# Patient Record
Sex: Female | Born: 1940 | Race: White | Hispanic: No | Marital: Married | Smoking: Never smoker
Health system: Southern US, Community
[De-identification: ages and names within clinical notes are randomized; demographics above are authoritative.]

## PROBLEM LIST (undated history)

## (undated) DIAGNOSIS — G2581 Restless legs syndrome: Secondary | ICD-10-CM

## (undated) DIAGNOSIS — K219 Gastro-esophageal reflux disease without esophagitis: Secondary | ICD-10-CM

## (undated) DIAGNOSIS — E785 Hyperlipidemia, unspecified: Secondary | ICD-10-CM

## (undated) DIAGNOSIS — L509 Urticaria, unspecified: Secondary | ICD-10-CM

## (undated) DIAGNOSIS — E039 Hypothyroidism, unspecified: Secondary | ICD-10-CM

## (undated) DIAGNOSIS — I340 Nonrheumatic mitral (valve) insufficiency: Secondary | ICD-10-CM

## (undated) DIAGNOSIS — K3184 Gastroparesis: Secondary | ICD-10-CM

## (undated) DIAGNOSIS — R945 Abnormal results of liver function studies: Secondary | ICD-10-CM

## (undated) DIAGNOSIS — I10 Essential (primary) hypertension: Secondary | ICD-10-CM

## (undated) DIAGNOSIS — R39198 Other difficulties with micturition: Secondary | ICD-10-CM

## (undated) DIAGNOSIS — D369 Benign neoplasm, unspecified site: Secondary | ICD-10-CM

## (undated) DIAGNOSIS — K5730 Diverticulosis of large intestine without perforation or abscess without bleeding: Secondary | ICD-10-CM

## (undated) DIAGNOSIS — M4802 Spinal stenosis, cervical region: Secondary | ICD-10-CM

## (undated) DIAGNOSIS — F419 Anxiety disorder, unspecified: Secondary | ICD-10-CM

## (undated) DIAGNOSIS — M1990 Unspecified osteoarthritis, unspecified site: Secondary | ICD-10-CM

## (undated) DIAGNOSIS — K861 Other chronic pancreatitis: Secondary | ICD-10-CM

## (undated) DIAGNOSIS — K635 Polyp of colon: Secondary | ICD-10-CM

## (undated) DIAGNOSIS — D649 Anemia, unspecified: Secondary | ICD-10-CM

## (undated) DIAGNOSIS — R7611 Nonspecific reaction to tuberculin skin test without active tuberculosis: Secondary | ICD-10-CM

## (undated) DIAGNOSIS — M47816 Spondylosis without myelopathy or radiculopathy, lumbar region: Secondary | ICD-10-CM

## (undated) DIAGNOSIS — K760 Fatty (change of) liver, not elsewhere classified: Secondary | ICD-10-CM

## (undated) DIAGNOSIS — C50919 Malignant neoplasm of unspecified site of unspecified female breast: Secondary | ICD-10-CM

## (undated) DIAGNOSIS — R7989 Other specified abnormal findings of blood chemistry: Secondary | ICD-10-CM

## (undated) DIAGNOSIS — K5909 Other constipation: Secondary | ICD-10-CM

## (undated) DIAGNOSIS — G4700 Insomnia, unspecified: Secondary | ICD-10-CM

## (undated) DIAGNOSIS — G959 Disease of spinal cord, unspecified: Secondary | ICD-10-CM

## (undated) DIAGNOSIS — I351 Nonrheumatic aortic (valve) insufficiency: Secondary | ICD-10-CM

## (undated) HISTORY — PX: UTERINE FIBROID SURGERY: SHX826

## (undated) HISTORY — DX: Nonrheumatic mitral (valve) insufficiency: I34.0

## (undated) HISTORY — DX: Other specified abnormal findings of blood chemistry: R79.89

## (undated) HISTORY — DX: Hyperlipidemia, unspecified: E78.5

## (undated) HISTORY — DX: Unspecified osteoarthritis, unspecified site: M19.90

## (undated) HISTORY — DX: Other chronic pancreatitis: K86.1

## (undated) HISTORY — DX: Spondylosis without myelopathy or radiculopathy, lumbar region: M47.816

## (undated) HISTORY — DX: Anxiety disorder, unspecified: F41.9

## (undated) HISTORY — PX: APPENDECTOMY: SHX54

## (undated) HISTORY — DX: Disease of spinal cord, unspecified: G95.9

## (undated) HISTORY — DX: Nonspecific reaction to tuberculin skin test without active tuberculosis: R76.11

## (undated) HISTORY — PX: EYE SURGERY: SHX253

## (undated) HISTORY — DX: Malignant neoplasm of unspecified site of unspecified female breast: C50.919

## (undated) HISTORY — DX: Gastroparesis: K31.84

## (undated) HISTORY — DX: Spinal stenosis, cervical region: M48.02

## (undated) HISTORY — PX: OTHER SURGICAL HISTORY: SHX169

## (undated) HISTORY — DX: Essential (primary) hypertension: I10

## (undated) HISTORY — DX: Abnormal results of liver function studies: R94.5

## (undated) HISTORY — DX: Diverticulosis of large intestine without perforation or abscess without bleeding: K57.30

## (undated) HISTORY — DX: Insomnia, unspecified: G47.00

## (undated) HISTORY — DX: Hypothyroidism, unspecified: E03.9

## (undated) HISTORY — DX: Nonrheumatic aortic (valve) insufficiency: I35.1

## (undated) HISTORY — DX: Fatty (change of) liver, not elsewhere classified: K76.0

## (undated) HISTORY — DX: Polyp of colon: K63.5

## (undated) HISTORY — PX: GANGLION CYST EXCISION: SHX1691

## (undated) HISTORY — DX: Benign neoplasm, unspecified site: D36.9

## (undated) HISTORY — DX: Gastro-esophageal reflux disease without esophagitis: K21.9

---

## 1998-11-19 DIAGNOSIS — D369 Benign neoplasm, unspecified site: Secondary | ICD-10-CM

## 1998-11-19 HISTORY — DX: Benign neoplasm, unspecified site: D36.9

## 1999-05-22 HISTORY — PX: CARDIAC CATHETERIZATION: SHX172

## 2000-12-15 ENCOUNTER — Inpatient Hospital Stay (HOSPITAL_COMMUNITY): Admission: EM | Admit: 2000-12-15 | Discharge: 2000-12-17 | Payer: Self-pay | Admitting: *Deleted

## 2001-09-19 ENCOUNTER — Ambulatory Visit (HOSPITAL_COMMUNITY): Admission: RE | Admit: 2001-09-19 | Discharge: 2001-09-19 | Payer: Self-pay | Admitting: Gastroenterology

## 2001-09-19 ENCOUNTER — Encounter: Payer: Self-pay | Admitting: Gastroenterology

## 2001-11-18 ENCOUNTER — Encounter: Payer: Self-pay | Admitting: Internal Medicine

## 2001-11-18 ENCOUNTER — Encounter: Admission: RE | Admit: 2001-11-18 | Discharge: 2001-11-18 | Payer: Self-pay | Admitting: Internal Medicine

## 2003-02-17 ENCOUNTER — Encounter: Payer: Self-pay | Admitting: Internal Medicine

## 2003-02-17 ENCOUNTER — Encounter: Admission: RE | Admit: 2003-02-17 | Discharge: 2003-02-17 | Payer: Self-pay | Admitting: Internal Medicine

## 2003-02-20 ENCOUNTER — Emergency Department (HOSPITAL_COMMUNITY): Admission: EM | Admit: 2003-02-20 | Discharge: 2003-02-20 | Payer: Self-pay | Admitting: Emergency Medicine

## 2003-02-20 ENCOUNTER — Encounter: Payer: Self-pay | Admitting: Emergency Medicine

## 2003-03-09 ENCOUNTER — Ambulatory Visit (HOSPITAL_COMMUNITY): Admission: RE | Admit: 2003-03-09 | Discharge: 2003-03-09 | Payer: Self-pay | Admitting: Neurology

## 2004-08-30 ENCOUNTER — Ambulatory Visit: Payer: Self-pay | Admitting: Gastroenterology

## 2004-09-05 ENCOUNTER — Ambulatory Visit: Payer: Self-pay | Admitting: Gastroenterology

## 2004-09-14 ENCOUNTER — Ambulatory Visit: Payer: Self-pay | Admitting: Gastroenterology

## 2004-09-21 ENCOUNTER — Ambulatory Visit: Payer: Self-pay | Admitting: Internal Medicine

## 2004-09-25 ENCOUNTER — Encounter: Payer: Self-pay | Admitting: Internal Medicine

## 2004-09-25 ENCOUNTER — Ambulatory Visit: Payer: Self-pay | Admitting: Gastroenterology

## 2004-09-25 LAB — HM COLONOSCOPY

## 2004-12-18 ENCOUNTER — Ambulatory Visit: Payer: Self-pay | Admitting: Internal Medicine

## 2005-02-23 ENCOUNTER — Ambulatory Visit: Payer: Self-pay | Admitting: Internal Medicine

## 2005-02-28 ENCOUNTER — Ambulatory Visit: Payer: Self-pay | Admitting: Internal Medicine

## 2005-09-20 ENCOUNTER — Ambulatory Visit: Payer: Self-pay | Admitting: Gastroenterology

## 2005-09-20 LAB — PROTIME-INR

## 2005-09-24 ENCOUNTER — Ambulatory Visit: Payer: Self-pay | Admitting: Gastroenterology

## 2005-10-01 ENCOUNTER — Ambulatory Visit: Payer: Self-pay | Admitting: Internal Medicine

## 2005-11-22 ENCOUNTER — Ambulatory Visit: Payer: Self-pay | Admitting: Internal Medicine

## 2006-02-26 ENCOUNTER — Ambulatory Visit: Payer: Self-pay | Admitting: Internal Medicine

## 2006-03-18 ENCOUNTER — Ambulatory Visit: Payer: Self-pay | Admitting: Internal Medicine

## 2006-03-22 ENCOUNTER — Ambulatory Visit: Payer: Self-pay | Admitting: Internal Medicine

## 2006-03-22 LAB — CONVERTED CEMR LAB
Calcium: 9.3 mg/dL
Sed Rate: 37 mm/h — ABNORMAL HIGH
TSH: 1.73 u[IU]/mL
Total CK: 85 U/L

## 2006-04-17 ENCOUNTER — Ambulatory Visit: Payer: Self-pay | Admitting: Internal Medicine

## 2006-07-26 ENCOUNTER — Ambulatory Visit: Payer: Self-pay | Admitting: Internal Medicine

## 2006-09-04 ENCOUNTER — Ambulatory Visit: Payer: Self-pay | Admitting: Internal Medicine

## 2006-09-04 LAB — CONVERTED CEMR LAB
ALT: 94 units/L — ABNORMAL HIGH (ref 0–40)
AST: 72 units/L — ABNORMAL HIGH (ref 0–37)
Alkaline Phosphatase: 51 units/L (ref 39–117)
Bilirubin, Direct: 0.1 mg/dL (ref 0.0–0.3)
Total Bilirubin: 0.6 mg/dL (ref 0.3–1.2)
Total Protein: 7.6 g/dL (ref 6.0–8.3)

## 2006-10-30 ENCOUNTER — Telehealth: Payer: Self-pay | Admitting: Internal Medicine

## 2006-11-11 ENCOUNTER — Ambulatory Visit: Payer: Self-pay | Admitting: Internal Medicine

## 2006-11-11 DIAGNOSIS — E039 Hypothyroidism, unspecified: Secondary | ICD-10-CM

## 2006-11-11 DIAGNOSIS — G43909 Migraine, unspecified, not intractable, without status migrainosus: Secondary | ICD-10-CM | POA: Insufficient documentation

## 2006-11-11 DIAGNOSIS — I10 Essential (primary) hypertension: Secondary | ICD-10-CM | POA: Insufficient documentation

## 2006-11-11 DIAGNOSIS — E119 Type 2 diabetes mellitus without complications: Secondary | ICD-10-CM

## 2006-11-11 DIAGNOSIS — M1990 Unspecified osteoarthritis, unspecified site: Secondary | ICD-10-CM

## 2006-11-11 DIAGNOSIS — G2581 Restless legs syndrome: Secondary | ICD-10-CM

## 2006-11-11 DIAGNOSIS — R5382 Chronic fatigue, unspecified: Secondary | ICD-10-CM

## 2006-11-11 DIAGNOSIS — K5730 Diverticulosis of large intestine without perforation or abscess without bleeding: Secondary | ICD-10-CM | POA: Insufficient documentation

## 2006-11-11 DIAGNOSIS — K760 Fatty (change of) liver, not elsewhere classified: Secondary | ICD-10-CM

## 2006-11-13 LAB — CONVERTED CEMR LAB
Basophils Absolute: 0.1 10*3/uL (ref 0.0–0.1)
Basophils Relative: 1 % (ref 0.0–1.0)
Creatinine,U: 117.9 mg/dL
Hgb A1c MFr Bld: 7.7 % — ABNORMAL HIGH (ref 4.6–6.0)
MCHC: 33.3 g/dL (ref 30.0–36.0)
Microalb Creat Ratio: 6.8 mg/g (ref 0.0–30.0)
Microalb, Ur: 0.8 mg/dL (ref 0.0–1.9)
Monocytes Absolute: 0.5 10*3/uL (ref 0.2–0.7)
Monocytes Relative: 7.5 % (ref 3.0–11.0)
Platelets: 217 10*3/uL (ref 150–400)
RBC: 4.67 M/uL (ref 3.87–5.11)
RDW: 12.7 % (ref 11.5–14.6)

## 2006-11-18 ENCOUNTER — Telehealth: Payer: Self-pay | Admitting: Internal Medicine

## 2006-11-19 ENCOUNTER — Telehealth (INDEPENDENT_AMBULATORY_CARE_PROVIDER_SITE_OTHER): Payer: Self-pay | Admitting: *Deleted

## 2007-01-13 ENCOUNTER — Encounter: Payer: Self-pay | Admitting: Internal Medicine

## 2007-03-28 ENCOUNTER — Encounter: Payer: Self-pay | Admitting: Internal Medicine

## 2007-04-14 ENCOUNTER — Encounter: Payer: Self-pay | Admitting: Internal Medicine

## 2007-04-21 DIAGNOSIS — C50919 Malignant neoplasm of unspecified site of unspecified female breast: Secondary | ICD-10-CM

## 2007-04-21 HISTORY — DX: Malignant neoplasm of unspecified site of unspecified female breast: C50.919

## 2007-04-24 ENCOUNTER — Encounter: Payer: Self-pay | Admitting: Internal Medicine

## 2007-04-25 ENCOUNTER — Encounter: Payer: Self-pay | Admitting: Internal Medicine

## 2007-05-07 ENCOUNTER — Encounter: Payer: Self-pay | Admitting: Internal Medicine

## 2007-05-08 ENCOUNTER — Encounter: Payer: Self-pay | Admitting: Internal Medicine

## 2007-05-22 DIAGNOSIS — Z923 Personal history of irradiation: Secondary | ICD-10-CM

## 2007-05-22 HISTORY — DX: Personal history of irradiation: Z92.3

## 2007-05-22 HISTORY — PX: BREAST LUMPECTOMY: SHX2

## 2007-08-27 ENCOUNTER — Encounter: Payer: Self-pay | Admitting: Internal Medicine

## 2007-10-10 ENCOUNTER — Encounter: Payer: Self-pay | Admitting: Internal Medicine

## 2007-10-20 ENCOUNTER — Encounter
Admission: RE | Admit: 2007-10-20 | Discharge: 2007-10-20 | Payer: Self-pay | Admitting: Physical Medicine and Rehabilitation

## 2007-10-24 ENCOUNTER — Encounter: Payer: Self-pay | Admitting: Internal Medicine

## 2007-10-29 ENCOUNTER — Encounter: Payer: Self-pay | Admitting: Internal Medicine

## 2007-11-04 ENCOUNTER — Telehealth (INDEPENDENT_AMBULATORY_CARE_PROVIDER_SITE_OTHER): Payer: Self-pay | Admitting: *Deleted

## 2007-11-04 ENCOUNTER — Emergency Department (HOSPITAL_COMMUNITY): Admission: EM | Admit: 2007-11-04 | Discharge: 2007-11-04 | Payer: Self-pay | Admitting: Emergency Medicine

## 2007-11-05 ENCOUNTER — Ambulatory Visit: Payer: Self-pay | Admitting: Internal Medicine

## 2007-11-05 DIAGNOSIS — C50919 Malignant neoplasm of unspecified site of unspecified female breast: Secondary | ICD-10-CM | POA: Insufficient documentation

## 2007-11-05 DIAGNOSIS — R079 Chest pain, unspecified: Secondary | ICD-10-CM | POA: Insufficient documentation

## 2007-11-06 ENCOUNTER — Telehealth (INDEPENDENT_AMBULATORY_CARE_PROVIDER_SITE_OTHER): Payer: Self-pay | Admitting: *Deleted

## 2007-11-12 ENCOUNTER — Encounter: Payer: Self-pay | Admitting: Family Medicine

## 2007-11-20 ENCOUNTER — Ambulatory Visit: Payer: Self-pay | Admitting: Internal Medicine

## 2007-11-20 DIAGNOSIS — F411 Generalized anxiety disorder: Secondary | ICD-10-CM

## 2007-11-20 DIAGNOSIS — G250 Essential tremor: Secondary | ICD-10-CM

## 2007-11-20 DIAGNOSIS — G252 Other specified forms of tremor: Secondary | ICD-10-CM

## 2007-11-28 ENCOUNTER — Telehealth: Payer: Self-pay | Admitting: Internal Medicine

## 2007-11-28 LAB — CONVERTED CEMR LAB
ALT: 73 units/L — ABNORMAL HIGH (ref 0–35)
Alkaline Phosphatase: 41 units/L (ref 39–117)
BUN: 18 mg/dL (ref 6–23)
Bilirubin, Direct: 0.1 mg/dL (ref 0.0–0.3)
CO2: 30 meq/L (ref 19–32)
Calcium: 10.1 mg/dL (ref 8.4–10.5)
Eosinophils Relative: 1.3 % (ref 0.0–5.0)
Glucose, Bld: 95 mg/dL (ref 70–99)
Hemoglobin: 15.4 g/dL — ABNORMAL HIGH (ref 12.0–15.0)
LDL Cholesterol: 129 mg/dL — ABNORMAL HIGH (ref 0–99)
Lymphocytes Relative: 28.6 % (ref 12.0–46.0)
Monocytes Relative: 8.6 % (ref 3.0–12.0)
Neutro Abs: 4.8 10*3/uL (ref 1.4–7.7)
Potassium: 3.8 meq/L (ref 3.5–5.1)
RDW: 14 % (ref 11.5–14.6)
Sodium: 140 meq/L (ref 135–145)
Total CHOL/HDL Ratio: 3.6
Total Protein: 7.7 g/dL (ref 6.0–8.3)
Triglycerides: 76 mg/dL (ref 0–149)
WBC: 8 10*3/uL (ref 4.5–10.5)

## 2008-03-12 ENCOUNTER — Ambulatory Visit: Payer: Self-pay | Admitting: Internal Medicine

## 2008-03-12 DIAGNOSIS — E785 Hyperlipidemia, unspecified: Secondary | ICD-10-CM | POA: Insufficient documentation

## 2008-03-29 ENCOUNTER — Encounter: Payer: Self-pay | Admitting: Internal Medicine

## 2008-04-07 ENCOUNTER — Encounter: Payer: Self-pay | Admitting: Internal Medicine

## 2008-08-12 ENCOUNTER — Encounter: Payer: Self-pay | Admitting: Internal Medicine

## 2008-08-16 ENCOUNTER — Encounter: Payer: Self-pay | Admitting: Internal Medicine

## 2008-08-16 LAB — CONVERTED CEMR LAB
Creatinine, Ser: 0.8 mg/dL
Glucose, Bld: 182 mg/dL
Hemoglobin: 13.4 g/dL
Platelets: 166 10*3/uL

## 2008-08-19 ENCOUNTER — Encounter: Payer: Self-pay | Admitting: Internal Medicine

## 2008-09-03 ENCOUNTER — Encounter: Payer: Self-pay | Admitting: Internal Medicine

## 2008-10-06 ENCOUNTER — Ambulatory Visit: Payer: Self-pay | Admitting: Internal Medicine

## 2008-10-08 ENCOUNTER — Ambulatory Visit: Payer: Self-pay | Admitting: Internal Medicine

## 2008-10-12 ENCOUNTER — Telehealth: Payer: Self-pay | Admitting: Internal Medicine

## 2008-10-14 ENCOUNTER — Telehealth (INDEPENDENT_AMBULATORY_CARE_PROVIDER_SITE_OTHER): Payer: Self-pay | Admitting: *Deleted

## 2008-10-14 LAB — CONVERTED CEMR LAB
ALT: 86 units/L — ABNORMAL HIGH (ref 0–35)
HDL: 40 mg/dL (ref >39.00)
Hgb A1c MFr Bld: 7.3 % — ABNORMAL HIGH (ref 4.6–6.5)
LDL Cholesterol: 91 mg/dL (ref 0–99)
TSH: 0.34 microintl units/mL — ABNORMAL LOW (ref 0.35–5.50)
Total Bilirubin: 0.6 mg/dL (ref 0.3–1.2)
Total CHOL/HDL Ratio: 4
VLDL: 24 mg/dL (ref 0.0–40.0)

## 2008-11-03 ENCOUNTER — Encounter: Admission: RE | Admit: 2008-11-03 | Discharge: 2008-11-03 | Payer: Self-pay | Admitting: Internal Medicine

## 2008-11-03 ENCOUNTER — Telehealth (INDEPENDENT_AMBULATORY_CARE_PROVIDER_SITE_OTHER): Payer: Self-pay | Admitting: *Deleted

## 2008-11-26 ENCOUNTER — Encounter: Payer: Self-pay | Admitting: Internal Medicine

## 2008-12-14 ENCOUNTER — Telehealth (INDEPENDENT_AMBULATORY_CARE_PROVIDER_SITE_OTHER): Payer: Self-pay | Admitting: *Deleted

## 2008-12-23 ENCOUNTER — Ambulatory Visit: Payer: Self-pay | Admitting: Internal Medicine

## 2008-12-28 ENCOUNTER — Telehealth: Payer: Self-pay | Admitting: Internal Medicine

## 2008-12-29 ENCOUNTER — Encounter: Admission: RE | Admit: 2008-12-29 | Discharge: 2008-12-29 | Payer: Self-pay | Admitting: Internal Medicine

## 2008-12-29 ENCOUNTER — Encounter: Payer: Self-pay | Admitting: Internal Medicine

## 2008-12-31 ENCOUNTER — Encounter: Payer: Self-pay | Admitting: Internal Medicine

## 2009-01-03 ENCOUNTER — Telehealth (INDEPENDENT_AMBULATORY_CARE_PROVIDER_SITE_OTHER): Payer: Self-pay | Admitting: *Deleted

## 2009-01-10 ENCOUNTER — Telehealth (INDEPENDENT_AMBULATORY_CARE_PROVIDER_SITE_OTHER): Payer: Self-pay | Admitting: *Deleted

## 2009-01-12 ENCOUNTER — Telehealth: Payer: Self-pay | Admitting: Internal Medicine

## 2009-03-22 ENCOUNTER — Ambulatory Visit: Payer: Self-pay | Admitting: Internal Medicine

## 2009-03-22 DIAGNOSIS — M810 Age-related osteoporosis without current pathological fracture: Secondary | ICD-10-CM

## 2009-06-21 HISTORY — PX: CATARACT EXTRACTION, BILATERAL: SHX1313

## 2009-06-29 ENCOUNTER — Encounter: Payer: Self-pay | Admitting: Internal Medicine

## 2009-07-04 ENCOUNTER — Encounter: Payer: Self-pay | Admitting: Internal Medicine

## 2009-07-06 ENCOUNTER — Encounter: Payer: Self-pay | Admitting: Internal Medicine

## 2009-08-10 ENCOUNTER — Encounter: Payer: Self-pay | Admitting: Internal Medicine

## 2009-08-30 ENCOUNTER — Ambulatory Visit: Payer: Self-pay | Admitting: Internal Medicine

## 2009-08-30 DIAGNOSIS — L509 Urticaria, unspecified: Secondary | ICD-10-CM

## 2009-09-05 ENCOUNTER — Ambulatory Visit: Payer: Self-pay | Admitting: Internal Medicine

## 2009-09-13 ENCOUNTER — Ambulatory Visit: Payer: Self-pay | Admitting: Cardiovascular Disease

## 2009-09-21 ENCOUNTER — Encounter: Payer: Self-pay | Admitting: Internal Medicine

## 2009-09-22 ENCOUNTER — Ambulatory Visit: Payer: Self-pay | Admitting: Internal Medicine

## 2009-09-22 LAB — CONVERTED CEMR LAB
Blood in Urine, dipstick: NEGATIVE
Creatinine,U: 83.3 mg/dL
Glucose, Urine, Semiquant: 250
Hgb A1c MFr Bld: 7.9 % — ABNORMAL HIGH (ref 4.6–6.5)
Ketones, urine, test strip: NEGATIVE
TSH: 0.37 microintl units/mL (ref 0.35–5.50)
pH: 5

## 2009-09-23 ENCOUNTER — Encounter: Payer: Self-pay | Admitting: Internal Medicine

## 2009-09-23 LAB — CONVERTED CEMR LAB
Bacteria, UA: NONE SEEN
Casts: NONE SEEN /lpf
WBC, UA: NONE SEEN cells/hpf (ref <3)

## 2009-09-25 ENCOUNTER — Telehealth (INDEPENDENT_AMBULATORY_CARE_PROVIDER_SITE_OTHER): Payer: Self-pay | Admitting: *Deleted

## 2009-09-27 ENCOUNTER — Ambulatory Visit: Payer: Self-pay | Admitting: Internal Medicine

## 2009-09-28 ENCOUNTER — Telehealth (INDEPENDENT_AMBULATORY_CARE_PROVIDER_SITE_OTHER): Payer: Self-pay | Admitting: *Deleted

## 2009-09-28 ENCOUNTER — Telehealth: Payer: Self-pay | Admitting: Internal Medicine

## 2009-09-29 ENCOUNTER — Encounter: Payer: Self-pay | Admitting: Cardiovascular Disease

## 2009-09-29 ENCOUNTER — Encounter (HOSPITAL_COMMUNITY): Admission: RE | Admit: 2009-09-29 | Discharge: 2009-11-22 | Payer: Self-pay | Admitting: Cardiovascular Disease

## 2009-09-29 ENCOUNTER — Ambulatory Visit: Payer: Self-pay

## 2009-09-29 ENCOUNTER — Ambulatory Visit: Payer: Self-pay | Admitting: Internal Medicine

## 2009-09-29 ENCOUNTER — Ambulatory Visit (HOSPITAL_COMMUNITY): Admission: RE | Admit: 2009-09-29 | Discharge: 2009-09-29 | Payer: Self-pay | Admitting: Cardiovascular Disease

## 2009-10-03 ENCOUNTER — Telehealth: Payer: Self-pay | Admitting: Internal Medicine

## 2009-10-06 ENCOUNTER — Ambulatory Visit: Payer: Self-pay | Admitting: Internal Medicine

## 2009-10-10 ENCOUNTER — Telehealth (INDEPENDENT_AMBULATORY_CARE_PROVIDER_SITE_OTHER): Payer: Self-pay | Admitting: *Deleted

## 2009-10-11 ENCOUNTER — Ambulatory Visit: Payer: Self-pay | Admitting: Cardiovascular Disease

## 2009-10-12 ENCOUNTER — Telehealth (INDEPENDENT_AMBULATORY_CARE_PROVIDER_SITE_OTHER): Payer: Self-pay | Admitting: *Deleted

## 2009-10-12 ENCOUNTER — Telehealth: Payer: Self-pay | Admitting: Cardiovascular Disease

## 2009-10-20 LAB — CONVERTED CEMR LAB: IgE (Immunoglobulin E), Serum: 228.5 intl units/mL — ABNORMAL HIGH (ref 0.0–180.0)

## 2009-10-28 ENCOUNTER — Encounter (INDEPENDENT_AMBULATORY_CARE_PROVIDER_SITE_OTHER): Payer: Self-pay | Admitting: *Deleted

## 2009-11-01 ENCOUNTER — Ambulatory Visit: Payer: Self-pay | Admitting: Internal Medicine

## 2009-11-07 ENCOUNTER — Ambulatory Visit: Payer: Self-pay | Admitting: Internal Medicine

## 2009-11-29 ENCOUNTER — Telehealth: Payer: Self-pay | Admitting: Internal Medicine

## 2009-12-19 ENCOUNTER — Ambulatory Visit: Payer: Self-pay | Admitting: Internal Medicine

## 2009-12-19 LAB — CONVERTED CEMR LAB: Blood Glucose, Fingerstick: 133

## 2010-01-19 ENCOUNTER — Encounter (INDEPENDENT_AMBULATORY_CARE_PROVIDER_SITE_OTHER): Payer: Self-pay | Admitting: *Deleted

## 2010-01-19 ENCOUNTER — Ambulatory Visit: Payer: Self-pay | Admitting: Internal Medicine

## 2010-01-20 ENCOUNTER — Telehealth: Payer: Self-pay | Admitting: Internal Medicine

## 2010-01-24 ENCOUNTER — Encounter: Payer: Self-pay | Admitting: Internal Medicine

## 2010-01-25 ENCOUNTER — Telehealth: Payer: Self-pay | Admitting: Internal Medicine

## 2010-02-18 DIAGNOSIS — K635 Polyp of colon: Secondary | ICD-10-CM

## 2010-02-18 HISTORY — DX: Polyp of colon: K63.5

## 2010-02-23 ENCOUNTER — Encounter: Payer: Self-pay | Admitting: Internal Medicine

## 2010-02-27 ENCOUNTER — Encounter (INDEPENDENT_AMBULATORY_CARE_PROVIDER_SITE_OTHER): Payer: Self-pay | Admitting: *Deleted

## 2010-02-27 ENCOUNTER — Telehealth: Payer: Self-pay | Admitting: Internal Medicine

## 2010-03-01 ENCOUNTER — Ambulatory Visit: Payer: Self-pay | Admitting: Gastroenterology

## 2010-03-08 ENCOUNTER — Telehealth (INDEPENDENT_AMBULATORY_CARE_PROVIDER_SITE_OTHER): Payer: Self-pay | Admitting: *Deleted

## 2010-03-09 ENCOUNTER — Encounter: Payer: Self-pay | Admitting: Internal Medicine

## 2010-03-09 ENCOUNTER — Telehealth: Payer: Self-pay | Admitting: Gastroenterology

## 2010-03-15 ENCOUNTER — Ambulatory Visit: Payer: Self-pay | Admitting: Gastroenterology

## 2010-03-19 ENCOUNTER — Encounter: Payer: Self-pay | Admitting: Gastroenterology

## 2010-03-21 ENCOUNTER — Encounter: Payer: Self-pay | Admitting: Internal Medicine

## 2010-04-05 ENCOUNTER — Telehealth: Payer: Self-pay | Admitting: Internal Medicine

## 2010-06-11 ENCOUNTER — Encounter: Payer: Self-pay | Admitting: Internal Medicine

## 2010-06-11 ENCOUNTER — Encounter: Payer: Self-pay | Admitting: Gastroenterology

## 2010-06-18 LAB — CONVERTED CEMR LAB
BUN: 13 mg/dL
BUN: 18 mg/dL (ref 6–23)
Basophils Relative: 0.7 % (ref 0.0–3.0)
Bilirubin, Direct: 0 mg/dL (ref 0.0–0.3)
CO2, serum: 28 mmol/L
CO2: 30 meq/L (ref 19–32)
Calcium: 9.8 mg/dL
Calcium: 9.8 mg/dL (ref 8.4–10.5)
Chloride, Serum: 99 mmol/L
Chloride: 103 meq/L (ref 96–112)
Creatinine, Ser: 0.7 mg/dL (ref 0.4–1.2)
Eosinophils Relative: 2.2 % (ref 0.0–5.0)
Glucose, Bld: 253 mg/dL
HCT: 42.6 % (ref 36.0–46.0)
HDL: 53 mg/dL
Hemoglobin: 14.5 g/dL (ref 12.0–15.0)
Hgb A1c MFr Bld: 7 % — ABNORMAL HIGH (ref 4.6–6.5)
Hgb A1c MFr Bld: 8.3 %
Lipase: 26 units/L
Lymphs Abs: 2.2 10*3/uL (ref 0.7–4.0)
MCV: 94.7 fL (ref 78.0–100.0)
Monocytes Absolute: 0.5 10*3/uL (ref 0.1–1.0)
Neutro Abs: 4.3 10*3/uL (ref 1.4–7.7)
Platelets: 187 10*3/uL (ref 150.0–400.0)
Potassium: 4.8 meq/L (ref 3.5–5.1)
RBC count: 4.39 10*6/uL
RBC: 4.49 M/uL (ref 3.87–5.11)
TSH: 0.52 microintl units/mL (ref 0.35–5.50)
Total Bilirubin: 0.9 mg/dL
Total Protein: 7.6 g/dL (ref 6.0–8.3)
Total Protein: 7.7 g/dL
Triglyceride fasting, serum: 95 mg/dL
WBC, blood: 7.1 10*3/uL
WBC: 7.2 10*3/uL (ref 4.5–10.5)

## 2010-06-20 NOTE — Miscellaneous (Signed)
Summary: [facility]  Clinical Lists Changes  Medications: Added new medication of MOVIPREP 100 GM  SOLR (PEG-KCL-NACL-NASULF-NA ASC-C) As per prep instructions. - Signed Rx of MOVIPREP 100 GM  SOLR (PEG-KCL-NACL-NASULF-NA ASC-C) As per prep instructions.;  #1 x 0;  Signed;  Entered by: Durwin Glaze RN;  Authorized by: Meryl Dare MD [facility];  Method used: Electronically to [id number]*, [address], [place], [place]  [address], Ph: [phone], Fax: [phone] Observations: Added new observation of ALLERGY REV: Done (03/01/2010 10:18)    Prescriptions: MOVIPREP 100 GM  SOLR (PEG-KCL-NACL-NASULF-NA ASC-C) As per prep instructions.  #1 x 0   Entered by:   Durwin Glaze RN   Authorized by:   Meryl Dare MD [facility]   Signed by:   Durwin Glaze RN on 03/01/2010   Method used:   Electronically to        [facility] [phone]* (retail)       [address] [place], [place]  [address]       Ph: [phone]       Fax: [phone]   RxID:   [id number]

## 2010-06-20 NOTE — Consult Note (Signed)
Summary: chronic pain syndrome, anxiety---[facility]  [facility] [place]   Imported By: Lanelle Bal 03/07/2010 15:26:53  _____________________________________________________________________  External Attachment:    Type:   Image     Comment:   External Document

## 2010-06-20 NOTE — Assessment & Plan Note (Signed)
Summary: rto 3 weeks.cbs   Vital Signs:  Patient profile:   70 year old female Height:      64.5 inches Weight:      170.8 pounds BMI:     28.97 Pulse rate:   82 / minute BP sitting:   140 / 80  Vitals Entered By: [name] (Sep 22, 2009 10:36 AM) CC: rov, c/o of dysuria & still having hives   History of Present Illness: rov c/o of dysuria , on-off x a while , see ROS  still having hives-- to see allergist in few days   CP--she was seen by cardiology, and they are planning a stress test  all labs from 2/11 reviewed  Diabetes-- las from 2-11 showed a A1C 8.3 . no ambulatory CBGs   HTN-- BMP 2-11 ok   hypothyroidism -- good medication compliance   elevated LFTS , Fatty liver per CT 2009-- LFTs 2-11  reviewed     Current Medications (verified): 1)  Metformin Hcl 500 Mg  Tabs (Metformin Hcl) .... 2 By Mouth Two Times A Day 2)  Glimepiride 4 Mg  Tabs (Glimepiride) .Marland Kitchen.. 1 By Mouth Two Times A Day 3)  Ultram   Tabs (Tramadol Hcl Tabs) 4)  Maxzide-25 37.5-25 Mg  Tabs (Triamterene-Hctz) .Marland Kitchen.. 1 By Mouth Once Daily 5)  Synthroid 125 Mcg Tabs (Levothyroxine Sodium) .Marland Kitchen.. 1 By Mouth Once Daily 6)  Ambien 10 Mg  Tabs (Zolpidem Tartrate) .Marland Kitchen.. 1 By Mouth At Bedtime 7)  Ascensia Contour Test  Strp (Glucose Blood) 8)  Arimidex 1 Mg  Tabs (Anastrozole) 9)  Alprazolam Xr 0.5 Mg  Tb24 (Alprazolam) .... 1/2 To 1 By Mouth Qid As Needed Anxiety or Tremor As Needed 10)  Mobic 7.5 Mg Tabs (Meloxicam) .Marland Kitchen.. 1 By Mouth Once Daily Prn 11)  Sinemet Cr 25-100 Mg Cr-Tabs (Carbidopa-Levodopa) .Marland Kitchen.. 1 By Mouth At Bedtime As Needed Leg Cramps 12)  Flonase 50 Mcg/act Susp (Fluticasone Propionate) .... 2 Sprays On Each Side of The Nose Prn 13)  Prilosec 20 Mg Cpdr (Omeprazole) .Marland Kitchen.. 1 By Mouth Daily 14)  Limbril .Marland Kitchen.. 1 Po As Needed  Allergies (verified): 1)  ! Iodine 2)  ! Pepcid 3)  ! * Serzone 4)  ! Nsaids  Past History:  Past Medical History:  Diabetes mellitus, type  II HTN Anxiety Hyperlipidemia hypothyroidism  elevated LFTS , Fatty liver per CT 2009 Diverticulosis, colon OA cervical spine stenosis-- sees neuro surgery and Dr Ethelene Hal, s/p local injection 4-09 Breast cancer, hx of (DX 12- 2008) - S/P XRT (last 4-09)  Past Surgical History: Reviewed history from 09/07/2009 and no changes required. Lumpectomy (2009) - left breast Appendectomy bilateral cataract surgery 2/11  Social History: Reviewed history from 12/23/2008 and no changes required. Married lives in [place] half of the time 2 kids Never Smoked Alcohol use-no Regular exercise-no   Review of Systems General:  Denies fever. CV:  Denies swelling of feet; occasionally plapitations w/  hot flashes . GI:  Denies bloody stools, nausea, and vomiting. GU:  Denies hematuria and urinary hesitancy; some frecuency . Endo:  diet-- "trying to do good" exercise-- can't do  due to problems w/ neck pain.  Physical Exam  General:  alert and well-developed.   Lungs:  normal respiratory effort, no intercostal retractions, no accessory muscle use, and normal breath sounds.   Heart:  normal rate, regular rhythm, no murmur, and no gallop.   Extremities:  no edema Psych:  Oriented X3, memory intact for recent and  remote, normally interactive, good eye contact,  slightly anxious   Impression & Recommendations:  Problem # 1:  DYSURIA (ICD-788.1) chronic, on and off symptoms.  Will treat based on the urine culture. recommend to discuss w/ Dr Sharen Hones (female care provider) , dryness? Orders: UA Dipstick w/o Micro (manual) (40981) Specimen Handling (99000) T-Culture, Urine ([id number]) T-Urine Microscopic (21308-65784)  Problem # 2:  URTICARIA (ICD-708.9) to see an allergist soon  Problem # 3:  HYPERLIPIDEMIA (ICD-272.4) well-controlled  Lipid Panel Test Date: 07/04/2009                        Value        Units        H/L   Reference  Cholesterol:          149          mg/dL               (696-295) LDL Cholesterol:      19           mg/dL         L    (28-413) HDL Cholesterol:      53           mg/dL              (24-40) Triglyceride:         95           mg/dL              (10-272)                  Problem # 4:  HYPOTHYROIDISM (ICD-244.9) due for labs Her updated medication list for this problem includes:    Synthroid 125 Mcg Tabs (Levothyroxine sodium) .Marland Kitchen... 1 by mouth once daily  Labs Reviewed: TSH: 0.52 (12/23/2008)    HgBA1c: 7.0 (12/23/2008) Chol: 155 (10/08/2008)   HDL: 40.00 (10/08/2008)   LDL: 91 (10/08/2008)   TG: 120.0 (10/08/2008)  Orders: Venipuncture ([address]) TLB-TSH (Thyroid Stimulating Hormone) (84443-TSH)  Problem # 5:  HYPERTENSION (ICD-401.9) no  change for now, BMP 2/11 normal Her updated medication list for this problem includes:    Maxzide-25 37.5-25 Mg Tabs (Triamterene-hctz) .Marland Kitchen... 1 by mouth once daily  BP today: 140/80 Prior BP: 143/80 (09/13/2009)  Labs Reviewed: K+: 4.8 (08/30/2009) Creat: : 0.8 (08/30/2009)   Chol: 155 (10/08/2008)   HDL: 40.00 (10/08/2008)   LDL: 91 (10/08/2008)   TG: 120.0 (10/08/2008)  Problem # 6:  DIABETES MELLITUS, TYPE II (ICD-250.00) A1c 2/11 at 8.3. No change on her medication was recommended  with those results patient feels that she is doing a good  job with her diet, unable to exercise more due to neck pain declines nutritionist referral she has a long history of increased LFTs, she reports extensive previous workup.  Because of  the elevated LFTs I am hesitant to continue adding  oral hypoglycemiants . I discussed with her insulin, she likes to check a A1C before further advise   Her updated medication list for this problem includes:    Metformin Hcl 500 Mg Tabs (Metformin hcl) .Marland Kitchen... 2 by mouth two times a day    Glimepiride 4 Mg Tabs (Glimepiride) .Marland Kitchen... 1 by mouth two times a day  Labs Reviewed: Creat: 0.8 (08/30/2009)     Last Eye Exam: normal (05/21/2008) Reviewed HgBA1c results: 7.0  (12/23/2008)  7.3 (10/08/2008)  Orders: TLB-A1C / Hgb A1C (Glycohemoglobin) (83036-A1C) TLB-Microalbumin/Creat  Ratio, Urine (82043-MALB)  Complete Medication List: 1)  Metformin Hcl 500 Mg Tabs (Metformin hcl) .... 2 by mouth two times a day 2)  Glimepiride 4 Mg Tabs (Glimepiride) .Marland Kitchen.. 1 by mouth two times a day 3)  Ultram Tabs (Tramadol hcl tabs) 4)  Maxzide-25 37.5-25 Mg Tabs (Triamterene-hctz) .Marland Kitchen.. 1 by mouth once daily 5)  Synthroid 125 Mcg Tabs (Levothyroxine sodium) .Marland Kitchen.. 1 by mouth once daily 6)  Ambien 10 Mg Tabs (Zolpidem tartrate) .Marland Kitchen.. 1 by mouth at bedtime 7)  Ascensia Contour Test Strp (Glucose blood) 8)  Arimidex 1 Mg Tabs (Anastrozole) 9)  Alprazolam Xr 0.5 Mg Tb24 (Alprazolam) .... 1/2 to 1 by mouth qid as needed anxiety or tremor as needed 10)  Mobic 7.5 Mg Tabs (Meloxicam) .Marland Kitchen.. 1 by mouth once daily prn 11)  Sinemet Cr 25-100 Mg Cr-tabs (Carbidopa-levodopa) .Marland Kitchen.. 1 by mouth at bedtime as needed leg cramps 12)  Flonase 50 Mcg/act Susp (Fluticasone propionate) .... 2 sprays on each side of the nose prn 13)  Prilosec 20 Mg Cpdr (Omeprazole) .Marland Kitchen.. 1 by mouth daily 14)  Limbril  .Marland Kitchen.. 1 po as needed  Patient Instructions: 1)  Please schedule a follow-up appointment in 3 months .   Laboratory Results   Urine Tests    Routine Urinalysis   Glucose: 250   (Normal Range: Negative) Bilirubin: negative   (Normal Range: Negative) Ketone: negative   (Normal Range: Negative) Spec. Gravity: 1.015   (Normal Range: 1.003-1.035) Blood: negative   (Normal Range: Negative) pH: 5.0   (Normal Range: 5.0-8.0) Protein: negative   (Normal Range: Negative) Urobilinogen: 0.2   (Normal Range: 0-1) Nitrite: negative   (Normal Range: Negative) Leukocyte Esterace: trace   (Normal Range: Negative)

## 2010-06-20 NOTE — Progress Notes (Signed)
Summary: RHEUMATOLOGY REFERRAL  ---- Converted from flag ---- ---- 01/20/2010 8:28 AM, Jose E. Paz MD wrote: fibromyalgia, fatigue, anxiety  ---- 01/20/2010 8:25 AM, Magdalen Spatz [facility] wrote: Dr. Drue Novel,  Will you please list a DX code on [name]'s Rheumatology referral?  Thank you,  Renee  ---- 01/19/2010 5:35 PM, Nolon Rod. Paz MD wrote: [name] try Dr Dierdre Forth (FYI she isleaving to [place] in November ) ------------------------------

## 2010-06-20 NOTE — Letter (Signed)
Summary: Buckner Malta MD  Buckner Malta MD   Imported By: Lanelle Bal 09/27/2009 08:25:33  _____________________________________________________________________  External Attachment:    Type:   Image     Comment:   External Document

## 2010-06-20 NOTE — Progress Notes (Signed)
  Pt signed ROI, sent to [facility] for processing [facility]  Oct 12, 2009 8:44 AM

## 2010-06-20 NOTE — Progress Notes (Signed)
Summary: READING (lmom 7/12)   Phone Note Call from Patient Call back at [facility] Phone [phone]   Caller: Patient Reason for Call: Refill Medication Summary of Call: PT CALL WITH NUMBER FOR DIABETIC READING --145,159,129,130,170,165,186-F,144,136-F,117,201,162,166,149, BEFORE BREAFAST TAKING THE 3 METFORMIN PILL NEED IT TO BE REGULATED. NEED SOMETHING DONE Initial call taken by: Freddy Jaksch,  November 29, 2009 1:58 PM  Follow-up for Phone Call        increase Lantus to 25 units daily Call with readings in 2 weeks Follow-up by: [facility] E. Lavoy Bernards MD,  November 29, 2009 3:30 PM  Additional Follow-up for Phone Call Additional follow up Details #1::        Left message for pt to call back. Army Fossa CMA  November 29, 2009 3:39 PM  Patient spouse notified. Lucious Groves  November 30, 2009 11:25 AM

## 2010-06-20 NOTE — Progress Notes (Signed)
Summary: check on results from testing  Phone Note Call from Patient Call back at Home Phone [phone] Call back at cell# [phone]   Caller: Patient Reason for Call: Talk to Nurse, Talk to Doctor Summary of Call: pt's a daughter is a patient her name is Sharon Armstrong and when Sharon Armstrong got her results yesterday from [facility] they where they where Identical to her daughters and that sounds strange and they want to [facility] sure the records and reports didn't get mixed up Initial call taken by: Omer Jack,  Oct 12, 2009 11:02 AM  Follow-up for Phone Call        Left message to call back Sharon Staggers, RN  Oct 12, 2009 11:15 AM  Spoke with pt. Pt. would like to make sure the echo results were not mixed up  with that of her daughter  Sharon Armstrong . Pt's echo has done on 09/29/09. [name]'s echo was done 02/15/09. Pt. states  both  results were the same. A copy of  both echoes was mailed to pt. as requested. Pt. states she would like to compare it herself. Sharon Gross, RN, BSN  Oct 12, 2009 12:22 PM      Appended Document: check on results from testing The results are close but clearly two different studies and two different reports. Reports are generated on studies as they are read so the study from October would not have been mistaken for the current study. cdm

## 2010-06-20 NOTE — Letter (Signed)
Summary: [facility] Instructions  [facility]  [address] [place], [place] [address]   Phone: [phone]  Fax: [phone]       Sharon Armstrong    04-Nov-1940    MRN: [id number]        Procedure Day Dorna Bloom:  Wednesday 03/15/2010     Arrival Time: 10:00 am      Procedure Time: 11:00 am     Location of Procedure:                    _x _  [facility] (4th Floor)                        PREPARATION FOR COLONOSCOPY WITH MOVIPREP   Starting 5 days prior to your procedure Friday 10/21 do not eat nuts, seeds, popcorn, corn, beans, peas,  salads, or any raw vegetables.  Do not take any fiber supplements (e.g. Metamucil, Citrucel, and Benefiber).  THE DAY BEFORE YOUR PROCEDURE         DATE: Tuesday 10/25  1.  Drink clear liquids the entire day-NO SOLID FOOD  2.  Do not drink anything colored red or purple.  Avoid juices with pulp.  No orange juice.  3.  Drink at least 64 oz. (8 glasses) of fluid/clear liquids during the day to prevent dehydration and help the prep work efficiently.  CLEAR LIQUIDS INCLUDE: Water Jello Ice Popsicles Tea (sugar ok, no milk/cream) Powdered fruit flavored drinks Coffee (sugar ok, no milk/cream) Gatorade Juice: apple, white grape, white cranberry  Lemonade Clear bullion, consomm, broth Carbonated beverages (any kind) Strained chicken noodle soup Hard Candy                             4.  In the morning, mix first dose of MoviPrep solution:    Empty 1 Pouch A and 1 Pouch B into the disposable container    Add lukewarm drinking water to the top line of the container. Mix to dissolve    Refrigerate (mixed solution should be used within 24 hrs)  5.  Begin drinking the prep at 5:00 p.m. The MoviPrep container is divided by 4 marks.   Every 15 minutes drink the solution down to the next mark (approximately 8 oz) until the full liter is complete.   6.  Follow completed prep with 16 oz of clear liquid of your choice  (Nothing red or purple).  Continue to drink clear liquids until bedtime.  7.  Before going to bed, mix second dose of MoviPrep solution:    Empty 1 Pouch A and 1 Pouch B into the disposable container    Add lukewarm drinking water to the top line of the container. Mix to dissolve    Refrigerate  THE DAY OF YOUR PROCEDURE      DATE: Wednesday 10/26  Beginning at 6:00 a.m. (5 hours before procedure):         1. Every 15 minutes, drink the solution down to the next mark (approx 8 oz) until the full liter is complete.  2. Follow completed prep with 16 oz. of clear liquid of your choice.    3. You may drink clear liquids until 9:00 am (2 HOURS BEFORE PROCEDURE).   MEDICATION INSTRUCTIONS  Unless otherwise instructed, you should take regular prescription medications with a small sip of water   as early as possible the morning of  your procedure.  Diabetic patients - see separate instructions.  Additional medication instructions:  DO NOT TAKE MAXZIDE ON THE DAY OF YOUR PROCEDURE.         OTHER INSTRUCTIONS  You will need a responsible adult at least 70 years of age to accompany you and drive you home.   This person must remain in the waiting room during your procedure.  Wear loose fitting clothing that is easily removed.  Leave jewelry and other valuables at home.  However, you may wish to bring a book to read or  an iPod/MP3 player to listen to music as you wait for your procedure to start.  Remove all body piercing jewelry and leave at home.  Total time from sign-in until discharge is approximately 2-3 hours.  You should go home directly after your procedure and rest.  You can resume normal activities the  day after your procedure.  The day of your procedure you should not:   Drive   Make legal decisions   Operate machinery   Drink alcohol   Return to work  You will receive specific instructions about eating, activities and medications before you  leave.    The above instructions have been reviewed and explained to me by   Durwin Glaze RN  March 01, 2010 11:20 AM    I fully understand and can verbalize these instructions _____________________________ Date _________

## 2010-06-20 NOTE — Miscellaneous (Signed)
Summary: labs from [facility])   Clinical Lists Changes  Observations: Added new observation of HGBA1C: 8.3 % (07/04/2009 10:38) Added new observation of TRIGLYCERIDE: 95 mg/dL (16/02/9603 54:09) Added new observation of HDL: 53 mg/dL (81/19/1478 29:56) Added new observation of LDL: 19 mg/dL (21/30/8657 84:69) Added new observation of CHOLESTEROL: 149 mg/dL (62/95/2841 32:44) Added new observation of LIPASE SERUM: 26 units/L (06/29/2009 10:43) Added new observation of PROTEIN, TOT: 7.7 g/dL (05/23/7251 66:44) Added new observation of ALBUMIN: 3.9 g/dL (03/47/4259 56:38) Added new observation of BILI TOTAL: 0.9 mg/dL (75/64/3329 51:88) Added new observation of ALK PHOS: 54 units/L (06/29/2009 10:42) Added new observation of SGPT (ALT): 92 units/L (06/29/2009 10:42) Added new observation of SGOT (AST): 82 units/L (06/29/2009 10:42) Added new observation of CALCIUM: 9.8 mg/dL (41/66/0630 16:01) Added new observation of BG RANDOM: 253 mg/dL (09/32/3557 32:20) Added new observation of CREATININE: 0.6 mg/dL (25/42/7062 37:62) Added new observation of BUN: 13 mg/dL (83/15/1761 60:73) Added new observation of CO2 TOTAL: 28 mmol/L (06/29/2009 10:42) Added new observation of CHLORIDE: 99 mmol/L (06/29/2009 10:42) Added new observation of POTASSIUM: 3.8 mmol/L (06/29/2009 10:42) Added new observation of SODIUM: 137 mmol/L (06/29/2009 10:42) Added new observation of PLATELET CNT: 161 10*3/microliter (06/29/2009 10:39) Added new observation of HCT: 42.7 % (06/29/2009 10:39) Added new observation of HGB: 14.6 g/dL (71/10/2692 85:46) Added new observation of RBC: 4.39 10*6/mm3 (06/29/2009 10:39) Added new observation of WBC: 7.1 10*3/mm3 (06/29/2009 10:39)      Lipid Panel Test Date: 07/04/2009                        Value        Units        H/L   Reference  Cholesterol:          149          mg/dL              (270-350) LDL Cholesterol:      19           mg/dL         L     (09-381) HDL Cholesterol:      53           mg/dL              (82-99) Triglyceride:         95           mg/dL              (37-169) CVEL3Y                8.3                                Complete Blood Count Test Date: 06/29/2009             Value   Units      H/L    Reference  WBC:       7.1   X 10^3/uL          (3.5-10.0) RBC:       4.39  X 10^6/uL          (3.60-5.00) Hgb:       14.6  g/dl               (10.1-75.1) Hct:       42.7  %                  (  35.0-46.0) Platelets: 161   X 10^3/uL          (150-450)    Chemistry Labs Test Date: 06/29/2009                      Value Units        H/L   Reference  Sodium:             137   mmol/L             (137-145) Potassium:          3.8   mmol/L             (3.6-5.0) Chloride:           99    mmol/L        L    (101-111) CO2:                28    mmol/L             (22-31) BUN:                13    mg/dL              (5-28) Creatinine:         0.6   mg/dL              (4.1-3.2) Glucose-random:     253   mg/dL         H    (44-010) Calcium (total):    9.8   mg/dL              (2-72.5) SGOT:               82    U/L           H    (10-40) SGPT:               92    U/L           H    (10-40) Alkaline P'tase:    54    U/L                (10-120) T. Bili:            0.9   mg/dL              (3.6-6.4) Albumin:            3.9   g/dL               (3-5) Total Protein:      7.7   g/dL          H    (4-7)    Lab Entry Test Date: 06/29/2009                        Value        Units        H/L   Reference  Lipase (ser):         26           U/L                (7-60)

## 2010-06-20 NOTE — Progress Notes (Signed)
Summary: procedure ?'s  Phone Note Call from Patient Call back at Home Phone [phone]   Caller: Patient Call For: Dr. Russella Dar Reason for Call: Talk to Nurse Summary of Call: questions regarding procedure and hemorrhoid injections Initial call taken by: Vallarie Mare,  March 09, 2010 3:02 PM  Follow-up for Phone Call        questions about hemorrhoid injections.   Follow-up by: Darcey Nora RN, CGRN,  March 09, 2010 3:08 PM

## 2010-06-20 NOTE — Letter (Signed)
Summary: Patient Notice- Polyp Results  [facility]  [address] [place], [place] [address]   Phone: [phone]  Fax: [phone]        March 19, 2010 MRN: [id number]    [facility] [address] [place], [place]  [address]    Dear Ms. Vannote,  I am pleased to inform you that the colon polyp(s) removed during your recent colonoscopy was (were) found to be benign (no cancer detected) upon pathologic examination.  I recommend you have a repeat colonoscopy examination in 3 years to look for recurrent polyps, as having colon polyps increases your risk for having recurrent polyps or even colon cancer in the future.  Should you develop new or worsening symptoms of abdominal pain, bowel habit changes or bleeding from the rectum or bowels, please schedule an evaluation with either your primary care physician or with me.  Continue treatment plan as outlined the day of your exam.  Please call us if you are having persistent problems or have questions about your condition that have not been fully answered at this time.  Sincerely,  Meryl Dare MD [facility]  This letter has been electronically signed by your physician.  Appended Document: Patient Notice- Polyp Results letter mailed

## 2010-06-20 NOTE — Assessment & Plan Note (Signed)
Summary: Cardiology Nuclear Study  Nuclear Med Background Indications for Stress Test: Evaluation for Ischemia   History: Heart Catheterization  History Comments: '02 Cath:normal coronaries, EF=60%  Symptoms: Chest Pain, Chest Pressure, DOE, Fatigue, Rapid HR, Syncope  Symptoms Comments: Syncope with CP 3 months ago. Last episode of CP:1 month ago.   Nuclear Pre-Procedure Cardiac Risk Factors: Hypertension, IDDM Type 2, Lipids Caffeine/Decaff Intake: None NPO After: 9:30 AM Lungs: Clear.  O2 Sat 98% on RA. IV 0.9% NS with Angio Cath: 22g     IV Site: (R) AC IV Started by: Irean Hong RN Chest Size (in) 36     Cup Size D     Height (in): 64.5 Weight (lb): 167 BMI: 28.32  Nuclear Med Study 1 or 2 day study:  1 day     Stress Test Type:  Eugenie Birks Reading MD:  Arvilla Meres, MD     Referring MD:  Verne Carrow, MD Resting Radionuclide:  Technetium 21m Tetrofosmin     Resting Radionuclide Dose:  11 mCi  Stress Radionuclide:  Technetium 19m Tetrofosmin     Stress Radionuclide Dose:  33 mCi   Stress Protocol   Lexiscan: 0.4 mg   Stress Test Technologist:  Rea [employer] CMA-N     Nuclear Technologist:  Domenic Polite CNMT  Rest Procedure  Myocardial perfusion imaging was performed at rest 45 minutes following the intravenous administration of Myoview Technetium 70m Tetrofosmin.  Stress Procedure  The patient received IV Lexiscan 0.4 mg over 15-seconds.  Myoview injected at 30-seconds.  There were no significant changes with lexiscan, only occasional PAC's.  She did c/o chest pressure.  Quantitative spect images were obtained after a 45 minute delay.  QPS Raw Data Images:  Normal; no motion artifact; normal heart/lung ratio. Stress Images:  There is normal uptake in all areas. Rest Images:  Normal homogeneous uptake in all areas of the myocardium. Subtraction (SDS):  Normal. Mild apical thinning Transient Ischemic Dilatation:  .90  (Normal <1.22)  Lung/Heart  Ratio:  .26  (Normal <0.45)  Quantitative Gated Spect Images QGS EDV:  60 ml QGS ESV:  13 ml QGS EF:  78 % QGS cine images:  Normal  Findings Normal nuclear study      Overall Impression  Exercise Capacity: Lexiscan study with no exercise. ECG Impression: Baseline: NSR; No significant ST segment change with Lexiscan. Overall Impression: Normal stress nuclear study.  Appended Document: Cardiology Nuclear Study No evidence of ischemia. cdm  Appended Document: Cardiology Nuclear Study Dr. Clifton James reviewed with pt at office visit 10/11/09

## 2010-06-20 NOTE — Progress Notes (Signed)
Summary: blood sugar readings  Phone Note Call from Patient Call back at Home Phone [phone]   Details for Reason: BLOOD SUGAR READINGS ON 7 UNITS OF LANTUS Summary of Call: FASTING: 185, 201, 216 NOT FASTING: 233, 237, 194 HS: 333, 288, 256, 227, 308 .Marland KitchenMarland KitchenShary Decamp  Oct 03, 2009 11:13 AM   Follow-up for Phone Call        increase Lantus from 7 to 12 units daily call  with CBGs in one week Follow-up by: [facility] E. Hennesy Sobalvarro MD,  Oct 03, 2009 11:24 AM  Additional Follow-up for Phone Call Additional follow up Details #1::        discussed with pt, med list updated.......Marland KitchenShary Decamp  Oct 03, 2009 3:51 PM     New/Updated Medications: LANTUS SOLOSTAR 100 UNIT/ML SOLN (INSULIN GLARGINE) 12 UNITS QHS -  PUT RX ON FILE - PT HAS SAMPLES RIGHT NOW

## 2010-06-20 NOTE — Assessment & Plan Note (Signed)
Summary: 6 WEEK FOLLOWUP///SPH   Vital Signs:  Patient profile:   70 year old female Weight:      163.50 pounds Pulse rate:   99 / minute Pulse rhythm:   regular BP sitting:   136 / 80  (left arm) Cuff size:   regular  Vitals Entered By: Army Fossa CMA (December 19, 2009 2:43 PM) CC: Pt here for f/u on DM. CBG Result 133 Comments -Was exposed to strep thorat, has a minor sore throat now. - Wants a handicap sticker. - Wants a lyme test done.  - compared our CBG machine to hers we got 133 she got 145.    History of Present Illness:  Pt here for f/u on DM. CBG Result in AM varies from 104 to 300s but rarely is over 200 compared our CBG machine to hers we got 133 she got 145.   Was exposed to strep thorat, has a mild sore throat now.  Wants a handicap sticker, cant walk far d/t back pain  Wants a lyme test done because she worries about a tic bite she had 4 years ago, since then she feels like "everything is going down the hill" having back pain along with lack of energy, generalized aches and particularly back pain.  ROS  fever yesterday? no cough On further questioning, she admits to anxiety, describe herself as somebody who worries a lot all the time. although this is not something new, symptoms are worse since she started Arimidex. Also some depression, no suicidal ideas but at some time she wondered if anybody cares if she dies (her husband comes with her every visit, he seems to be very caring and compassionate)   Allergies: 1)  ! Iodine 2)  ! Pepcid 3)  ! * Serzone 4)  ! Nsaids 5)  ! * Ivp Dye  Past History:  Past Medical History: Reviewed history from 10/11/2009 and no changes required. Diabetes mellitus, type II HTN Anxiety Mild aortic valve insufficiency Hyperlipidemia hypothyroidism  elevated LFTS , Fatty liver per CT 2009 Diverticulosis, colon OA cervical spine stenosis-- sees neuro surgery and Dr Ethelene Hal, s/p local injection 4-09 Breast cancer,  hx of (DX 12- 2008) - S/P XRT (last 4-09) Urticaria onset 2/ 2011 Positive PPD, hx of, never treated (father died TB)  Past Surgical History: Reviewed history from 10/06/2009 and no changes required. Lumpectomy (2009) - left breast Appendectomy bilateral cataract surgery 2/11 Colon polyps Sinus cyst removed  Social History: Reviewed history from 12/23/2008 and no changes required. Married lives in [place] half of the time 2 kids Never Smoked Alcohol use-no Regular exercise-no   Review of Systems      See HPI  Physical Exam  General:  alert and well-developed.   Mouth:  no redness or discharge in the throat Lungs:  normal respiratory effort, no intercostal retractions, and no accessory muscle use.   Heart:  normal rate, regular rhythm, and no murmur.   Msk:  hands and wrists without synovitis Psych:  emotional, tearful   Impression & Recommendations:  Problem # 1:  ANXIETY (ICD-300.00) the patient certainly seems anxious She also has chronic fatigue, generalized aches and pains as well as chronic back pain Her sedimentation rate has been check a number of times and is always within normal she is not on cholesterol medication her symptoms are likely multifactorial: DJD, anxiety, Arimidex therapy, fibromyalgia?, Lyme disease is less likely but possible plan: offered counseling (declined) treat w/ SSRIs noting that  serzone is listed  in allergies list , she does not recall taking it ever patient quite concern about her liver, will avoid cymbalta for now Rx prozac  will call if s/e or suicidal ideas  reasses in 1 month, if no better, consider rheumatology referal for eval of other symptoms (fatigue, pain)  Her updated medication list for this problem includes:    Alprazolam Xr 0.5 Mg Tb24 (Alprazolam) .Marland Kitchen... 1/2 to 1 by mouth qid as needed anxiety or tremor as needed    Fluoxetine Hcl 20 Mg Tabs (Fluoxetine hcl) .Marland Kitchen... 1/2 tab a day x 10 days, then 1 a day  Problem #  2:  OSTEOARTHRITIS (ICD-715.90) see #1 will provide a parking permit Her updated medication list for this problem includes:    Ultram Tabs (Tramadol hcl tabs)    Mobic 7.5 Mg Tabs (Meloxicam) .Marland Kitchen... 1 by mouth once daily prn  Problem # 3:  HYPOTHYROIDISM (ICD-244.9) well-controlled Her updated medication list for this problem includes:    Synthroid 125 Mcg Tabs (Levothyroxine sodium) .Marland Kitchen... 1 by mouth once daily  Labs Reviewed: TSH: 0.37 (09/22/2009)    HgBA1c: 7.9 (09/22/2009) Chol: 149 (07/04/2009)   HDL: 53 (07/04/2009)   LDL: 19 (07/04/2009)   TG: 95 (07/04/2009)  Problem # 4:  DIABETES MELLITUS, TYPE II (ICD-250.00) new glucometer provided Her updated medication list for this problem includes:    Lantus Solostar 100 Unit/ml Soln (Insulin glargine) .Marland Kitchen... 25 units every night    Metformin Hcl 500 Mg Tabs (Metformin hcl) .Marland Kitchen... 1 by mouth three times a day  Orders: Capillary Blood Glucose/CBG (82948) TLB-A1C / Hgb A1C (Glycohemoglobin) (83036-A1C) Venipuncture (78295)  Problem # 5:  FATIGUE, CHRONIC (ICD-780.79) see #1  Problem # 6:  face-to-face more than 25 minutes, more than 50% of the time counseling see #1  Complete Medication List: 1)  Lantus Solostar 100 Unit/ml Soln (Insulin glargine) .... 25 units every night 2)  Metformin Hcl 500 Mg Tabs (Metformin hcl) .Marland Kitchen.. 1 by mouth three times a day 3)  Ultram Tabs (Tramadol hcl tabs) 4)  Maxzide 75-50 Mg Tabs (Triamterene-hctz) .... 1/2 by mouth qd 5)  Synthroid 125 Mcg Tabs (Levothyroxine sodium) .Marland Kitchen.. 1 by mouth once daily 6)  Ambien 10 Mg Tabs (Zolpidem tartrate) .Marland Kitchen.. 1 by mouth at bedtime 7)  Ascensia Contour Test Strp (Glucose blood) 8)  Arimidex 1 Mg Tabs (Anastrozole) .... Take 1 tablet by mouth once a day 9)  Alprazolam Xr 0.5 Mg Tb24 (Alprazolam) .... 1/2 to 1 by mouth qid as needed anxiety or tremor as needed 10)  Mobic 7.5 Mg Tabs (Meloxicam) .Marland Kitchen.. 1 by mouth once daily prn 11)  Sinemet Cr 25-100 Mg Cr-tabs  (Carbidopa-levodopa) .Marland Kitchen.. 1 by mouth at bedtime as needed leg cramps 12)  Limbril  .Marland Kitchen.. 1 po as needed 13)  Pen Needles 31g X 6 Mm Misc (Insulin pen needle) .Marland Kitchen.. 1 daily with lantus 14)  Mupirocin 2 % Oint (Mupirocin) .... Twice a day for one week 15)  Fluoxetine Hcl 20 Mg Tabs (Fluoxetine hcl) .... 1/2 tab a day x 10 days, then 1 a day 16)  Onetouch Delica Lancets Misc (Lancets) .... Check bs two times a day 17)  Onetouch Ultra Test Strp (Glucose blood) .... Check bs two times a day  Other Orders: Rapid Strep (62130)  Patient Instructions: 1)  Please schedule a follow-up appointment in 1 month.  Prescriptions: ONETOUCH ULTRA TEST  STRP (GLUCOSE BLOOD) check BS two times a day  #60 x 6   Entered  by:   Army Fossa CMA   Authorized by:   Nolon Rod. Shaunda Tipping MD   Signed by:   Army Fossa CMA on 12/20/2009   Method used:   Electronically to        [facility] [phone]* (retail)       [address] [place], [place]  [address]       Ph: [phone]       Fax: [phone]   RxID:   [id number] Dola Argyle LANCETS  MISC (LANCETS) check BS two times a day  #60 x 6   Entered by:   Army Fossa CMA   Authorized by:   Nolon Rod. Pallavi Clifton MD   Signed by:   Army Fossa CMA on 12/20/2009   Method used:   Electronically to        [facility] [phone]* (retail)       [address] [place], [place]  [address]       Ph: [phone]       Fax: [phone]   RxID:   [id number] [place] TEST  STRP (GLUCOSE BLOOD) as directed  #1 month x 6   Entered by:   Army Fossa CMA   Authorized by:   Nolon Rod. Victor Langenbach MD   Signed by:   Army Fossa CMA on 12/19/2009   Method used:   Electronically to        [facility] [phone]* (retail)       [address] [place], [place]  [address]       Ph: [phone]       Fax: [phone]   RxID:   [id number] Dola Argyle LANCETS  MISC (LANCETS) as directed.  #60 x 5   Entered by:   Army Fossa CMA   Authorized  by:   Nolon Rod. Kima Malenfant MD   Signed by:   Army Fossa CMA on 12/19/2009   Method used:   Electronically to        [facility] [phone]* (retail)       [address] [place], [place]  [address]       Ph: [phone]       Fax: [phone]   RxID:   [id number] FLUOXETINE HCL 20 MG TABS (FLUOXETINE HCL) 1/2 tab a day x 10 days, then 1 a day  #30 x 2   Entered and Authorized by:   Elita Quick E. Avraj Lindroth MD   Signed by:   Nolon Rod. Paige Vanderwoude MD on 12/19/2009   Method used:   Electronically to        [facility] [phone]* (retail)       [address] [place], [place]  [address]       Ph: [phone]       Fax: [phone]   RxID:   [id number]   Laboratory Results   Blood Tests     CBG Random:: 133mg /dL    Other Tests  Rapid Strep: negative Comments: Army Fossa CMA  December 19, 2009 3:31 PM

## 2010-06-20 NOTE — Assessment & Plan Note (Signed)
Summary: 1 month/apc   Primary Provider/Referring Provider:  Nolon Rod. Paz MD  CC:  1 month follow up visit-allergies..  History of Present Illness: History of Present Illness: Oct 06, 2009- 70 yoF seen with her husband today at request of Dr Drue Novel because of hives. Never-smoker, diabetic, living 1/2 time in [place]. New onset, 3 months ago, never prior. Generalized urticarial lesions began 3 months ago. One day prior, while in [place] during the winter, she had gone to hospitalwith chest pain. Treated only with an acid blocker. When hives began, she stopped the new med. lesions have continued every day on face, legs and "hot/ intertriginous areas. She is not worse after hot shower. Has had no angioedema or wheeze. Benadryl 50 mg two times a day suppresses rash and itching but makes her drowsy. Since then, she was begun on insulin 10 days ago. She was given Limbrel- herbal supplement- for pain. 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  On Arimidex since breast lumpectomy 2 years ago. We reviewed meds, noting absence of ACEI. She denies med changes in the interval prior to onset of rash, except as noted. Hc pos PPD, never treated. Contact dermatitis from several brands of toilet papet. Contrast dye- urticaria. Latex- contact dermatitis. Denies weight change, fever, nausea/ vomiting, bruising or bleeding, obvious infection.  November 07, 2009- urticaria..................................Marland Kitchenhusband here Still having hives, worse if she drops antinhistamines to check.  Angioedema panel Negative. Allergy profile- pos dust mites, ragweed, dog and cat. She feels skin remains sensitive, easier to irritate. He and she are doing body washes and nasal swabs now, after exposure to sister who died with MRSA. Hx breast cancer- Arimidex. Liver- hx fatty liver, watched because of hx of breast cancer.     Preventive Screening-Counseling & Management  Alcohol-Tobacco   Smoking Status: never  Current Medications (verified): 1)  Metformin Hcl 500 Mg  Tabs (Metformin Hcl) .Marland Kitchen.. 1 By Mouth Three Times A Day 2)  Ultram   Tabs (Tramadol Hcl Tabs) 3)  Maxzide 75-50 Mg Tabs (Triamterene-Hctz) .... 1/2 By Mouth Qd 4)  Synthroid 125 Mcg Tabs (Levothyroxine Sodium) .Marland Kitchen.. 1 By Mouth Once Daily 5)  Ambien 10 Mg  Tabs (Zolpidem Tartrate) .Marland Kitchen.. 1 By Mouth At Bedtime 6)  Ascensia Contour Test  Strp (Glucose Blood) 7)  Arimidex 1 Mg  Tabs (Anastrozole) .... Take 1 Tablet By Mouth Once A Day 8)  Alprazolam Xr 0.5 Mg  Tb24 (Alprazolam) .... 1/2 To 1 By Mouth Qid As Needed Anxiety or Tremor As Needed 9)  Mobic 7.5 Mg Tabs (Meloxicam) .Marland Kitchen.. 1 By Mouth Once Daily Prn 10)  Sinemet Cr 25-100 Mg Cr-Tabs (Carbidopa-Levodopa) .Marland Kitchen.. 1 By Mouth At Bedtime As Needed Leg Cramps 11)  Limbril .Marland Kitchen.. 1 Po As Needed 12)  Lantus Solostar 100 Unit/ml Soln (Insulin Glargine) .... 20 Units Every Night 13)  Pen Needles 31g X 6 Mm Misc (Insulin Pen Needle) .Marland Kitchen.. 1 Daily With Lantus 14)  Mupirocin 2 % Oint (Mupirocin) .... Twice A Day For One Week  Allergies (verified): 1)  ! Iodine 2)  ! Pepcid 3)  ! * Serzone 4)  ! Nsaids 5)  ! * Ivp Dye  Past History:  Past Medical History: Last updated: 10/11/2009 Diabetes mellitus, type II HTN Anxiety Mild aortic valve insufficiency Hyperlipidemia hypothyroidism  elevated LFTS , Fatty liver per CT 2009 Diverticulosis, colon OA cervical spine stenosis-- sees neuro surgery and Dr Ethelene Hal, s/p local injection 4-09  Breast cancer, hx of (DX 12- 2008) - S/P XRT (last 4-09) Urticaria onset 2/ 2011 Positive PPD, hx of, never treated (father died TB)  Past Surgical History: Last updated: 10/06/2009 Lumpectomy (2009) - left breast Appendectomy bilateral cataract surgery 2/11 Colon polyps Sinus cyst removed  Family History: Last updated: 10/06/2009 CAD - no HTN - M DM - no stroke - no colon Ca - no breast Ca - no M- deceased lung cancer  F -  died TB age 83  Social History: Last updated: 12/23/2008 Married lives in [place] half of the time 2 kids Never Smoked Alcohol use-no Regular exercise-no   Risk Factors: Alcohol Use: 0 (12/23/2008) Exercise: no (12/23/2008)  Risk Factors: Smoking Status: never (11/07/2009)  Review of Systems      See HPI  The patient denies shortness of breath with activity, shortness of breath at rest, productive cough, non-productive cough, coughing up blood, chest pain, irregular heartbeats, acid heartburn, indigestion, loss of appetite, weight change, abdominal pain, difficulty swallowing, sore throat, tooth/dental problems, headaches, nasal congestion/difficulty breathing through nose, and sneezing.    Vital Signs:  Patient profile:   70 year old female Height:      64.5 inches Weight:      167 pounds BMI:     28.32 O2 Sat:      98 % on Room air Pulse rate:   75 / minute BP sitting:   108 / 70  (left arm) Cuff size:   regular  Vitals Entered By: Reynaldo Minium CMA (November 07, 2009 3:51 PM)  O2 Flow:  Room air CC: 1 month follow up visit-allergies.   Physical Exam  Additional Exam:  General: A/Ox3; pleasant and cooperative, NAD, SKIN: no rash, lesions. She can't demonstrate any urticaria at this exam. No dermographism on scratch today. NODES: no lymphadenopathy HEENT: [place]/AT, EOM- WNL, Conjuctivae- clear, PERRLA, TM-WNL, Nose- clear, Throat- clear and wnl, Mallampati  II NECK: Supple w/ fair ROM, JVD- none, normal carotid impulses w/o bruits Thyroid- normal to palpation CHEST: Clear to P&A HEART: RRR, no m/g/r heard ABDOMEN: Soft and nl; nml bowel sounds; no organomegaly or masses noted TFT:DDUK, nl pulses, no edema  NEURO: Grossly intact to observation      Impression & Recommendations:  Problem # 1:  URTICARIA (ICD-708.9)  She continues allegra 180, taken in the afternoon. It seems to wear off in the morning, so she will try supplementing with a 12 hour tab taken on  waking.  Thyroid and liver disease and hormonal manipulation have all bee associated with urticaria. We discussed previous efforts to associate her hives with these conditions.  Problem # 2:  ? of MRSA (ICD-041.12) Hx of exposure as described, but no overt infection. I don't anticipate problem, especially given the suppression regimen they are following.  Other Orders: Est. Patient Level III (02542)  Patient Instructions: 1)  Please schedule a follow-up appointment in 4 months. 2)  Ok to take allegra 180, then supplement if needed with an extra 12 hour tab if needed

## 2010-06-20 NOTE — Progress Notes (Signed)
Summary: Nuclear Pre-Procedure  Phone Note Outgoing Call   Call placed by: Milana Na, EMT-P,  Sep 28, 2009 4:03 PM Summary of Call: Left message with information on Myoview Information Sheet (see scanned document for details). Busy     Nuclear Med Background Indications for Stress Test: Evaluation for Ischemia   History: Heart Catheterization  History Comments: '02 Heart Cath NL EF 60% ?Angioplasty? LAD SWMI  Symptoms: Chest Pain, Chest Pressure, Fatigue, Syncope    Nuclear Pre-Procedure Cardiac Risk Factors: Hypertension, Lipids, NIDDM Height (in): 64.5  Nuclear Med Study Referring MD:  C.McAlhany

## 2010-06-20 NOTE — Progress Notes (Signed)
Summary: new pharmacy for Fluoxetine  Phone Note Refill Request Call back at pt phone=[phone] or cell=[phone] Message from:  Patient on April 05, 2010 11:36 AM  Refills Requested: Medication #1:  FLUOXETINE HCL 20 MG TABS 1/2 tab a day x 10 days patient is in [place] for six months--needs this prescription sent to [facility], [place], [place]  their phone= [phone], their fax=[phone]  Initial call taken by: Jerolyn Shin,  April 05, 2010 11:40 AM  Follow-up for Phone Call        Is this ok, please advise. Lucious Groves CMA  April 05, 2010 11:48 AM   Additional Follow-up for Phone Call Additional follow up Details #1::        ok to call fluoxetine 20 mg one p.o. today, #90, no refills. Additional Follow-up by: Nolon Rod. Paz MD,  April 05, 2010 5:07 PM    Additional Follow-up for Phone Call Additional follow up Details #2::    RX phoned to out of state pharmacy. Patient notified. Follow-up by: Lucious Groves CMA,  April 06, 2010 11:44 AM  New/Updated Medications: FLUOXETINE HCL 20 MG TABS (FLUOXETINE HCL) 1 by mouth once daily Prescriptions: FLUOXETINE HCL 20 MG TABS (FLUOXETINE HCL) 1 by mouth once daily  #90 x 0   Entered by:   Lucious Groves CMA   Authorized by:   Nolon Rod. Paz MD   Signed by:   Lucious Groves CMA on 04/06/2010   Method used:   Telephoned to ...       [address] [phone]* (retail)       [address] [place], [place]  [address]       Ph: [phone]       Fax: [phone]   RxID:   [id number]

## 2010-06-20 NOTE — Progress Notes (Signed)
Summary: RHEUMATOLOGY REFERRAL  Phone Note From Other Clinic   Reason for Call: Need Referral Information Summary of Call: IN REFERENCE TO RHEUMATOLOGY REFERRAL, A FAX HAS BEEN SENT TO [place] FROM DR. BEEKMAN'S OFFICE, STATING THAT THEY ARE UNABLE TO SEE THIS PT, DUE TO THEIR RHEUMATOLOGISTS ARE NOT TAKING ANY NEW FIBROMYALGIA PATIENTS.  IS THERE ANOTHER PHYSICIAN PREFERENCE YOU WOULD LIKE ME TO TRY & REFER THE PATIENT TO? Initial call taken by: Magdalen Spatz [facility],  January 25, 2010 11:32 AM  Follow-up for Phone Call        please try another rheumatologist in town. If unable to get a local rheumatologist, she will have to discuss the  referral with her primary doctor in [place] Follow-up by: Jose E. Paz MD,  January 26, 2010 11:54 AM  Additional Follow-up for Phone Call Additional follow up Details #1::        REFERRED PATIENT TO [facility],  HER APPOINTMENT IS 02-23-2010 WITH DR.  Zenovia Jordan. I SPOKE WITH PATIENT, SHE IS AWARE.    Additional Follow-up by: Magdalen Spatz [facility],  January 27, 2010 11:44 AM

## 2010-06-20 NOTE — Progress Notes (Signed)
Summary: labs results, LM 5/9  Phone Note Outgoing Call Call back at Home Phone [phone]   Reason for Call: Discuss lab or test results Summary of Call: advise patient: --TSH ok, continue same meds  --DM not well controlled, see last OV, she said she is willing to take insulin rec: d/c glimepiride start lantus 7 u once daily, call w/ CBGs in 1 week, watch for GI s/e arrange nurse visit for lantus teaching and samples if patient ageeable Jose E. Paz MD  Sep 25, 2009 2:52 PM   Follow-up for Phone Call        left message with husband to have pt return call................Marland KitchenShary Decamp  Sep 26, 2009 11:25 AM discussed with pt -- pt will come in tomorrow for nurse visit......Marland KitchenShary Decamp  Sep 26, 2009 1:20 PM

## 2010-06-20 NOTE — Cardiovascular Report (Signed)
Summary: Outpatient Coinsurance Notice   Outpatient Coinsurance Notice   Imported By: Roderic Ovens 10/03/2009 12:51:37  _____________________________________________________________________  External Attachment:    Type:   Image     Comment:   External Document

## 2010-06-20 NOTE — Assessment & Plan Note (Signed)
Summary: per check out/sf  Medications Added ARIMIDEX 1 MG  TABS (ANASTROZOLE) Take 1 tablet by mouth once a day        Visit Type:  Follow-up Primary Provider:  Nolon Rod. Paz MD  CC:  No cardiac complaints.  History of Present Illness: 70 yo WF with h/o DM, HTN, hyperlipidemia and breast cancer s/p XRT in 2009 to left breast who is here today for follow up. She was seen as a new pt several weeks ago for evaluation of band-like chest pain that lasts for an hour. The last episode was two months prior to her initial visit and occurred at rest. This happened in [place]. This was followed by syncope for a few seconds. She was admitted but no stress testing was performed. She reports normal cardiac cath in 2002. I reviewed this report. It was done at [facility] by Dr. Diona Browner. Mild  chest pain over last few weeks. She is very inactive secondary to chronic neck pain. Her energy level is overall decreased.   I ordered an echo and a stress test. Her stress test showed no ischemia. Echo with normal LV systolic function, normal EF, grade 1 diastolic dysfunction, mild AI. She has been feeling well. No recurrent chest pain. No SOB.   Current Medications (verified): 1)  Metformin Hcl 500 Mg  Tabs (Metformin Hcl) .Marland Kitchen.. 1 By Mouth Three Times A Day 2)  Ultram   Tabs (Tramadol Hcl Tabs) 3)  Maxzide 75-50 Mg Tabs (Triamterene-Hctz) .... 1/2 By Mouth Qd 4)  Synthroid 125 Mcg Tabs (Levothyroxine Sodium) .Marland Kitchen.. 1 By Mouth Once Daily 5)  Ambien 10 Mg  Tabs (Zolpidem Tartrate) .Marland Kitchen.. 1 By Mouth At Bedtime 6)  Ascensia Contour Test  Strp (Glucose Blood) 7)  Arimidex 1 Mg  Tabs (Anastrozole) .... Take 1 Tablet By Mouth Once A Day 8)  Alprazolam Xr 0.5 Mg  Tb24 (Alprazolam) .... 1/2 To 1 By Mouth Qid As Needed Anxiety or Tremor As Needed 9)  Mobic 7.5 Mg Tabs (Meloxicam) .Marland Kitchen.. 1 By Mouth Once Daily Prn 10)  Sinemet Cr 25-100 Mg Cr-Tabs (Carbidopa-Levodopa) .Marland Kitchen.. 1 By Mouth At Bedtime As Needed Leg Cramps 11)  Limbril .Marland Kitchen.. 1 Po  As Needed 12)  Lantus Solostar 100 Unit/ml Soln (Insulin Glargine) .Marland Kitchen.. 15 Units Qhs -  Put Rx On File - Pt Has Samples Right Now 13)  Pen Needles 31g X 6 Mm Misc (Insulin Pen Needle) .Marland Kitchen.. 1 Daily With Lantus  Allergies: 1)  ! Iodine 2)  ! Pepcid 3)  ! * Serzone 4)  ! Nsaids 5)  ! * Ivp Dye  Past History:  Past Medical History: Diabetes mellitus, type II HTN Anxiety Mild aortic valve insufficiency Hyperlipidemia hypothyroidism  elevated LFTS , Fatty liver per CT 2009 Diverticulosis, colon OA cervical spine stenosis-- sees neuro surgery and Dr Ethelene Hal, s/p local injection 4-09 Breast cancer, hx of (DX 12- 2008) - S/P XRT (last 4-09) Urticaria onset 2/ 2011 Positive PPD, hx of, never treated (father died TB)  Social History: Reviewed history from 12/23/2008 and no changes required. Married lives in [place] half of the time 2 kids Never Smoked Alcohol use-no Regular exercise-no   Review of Systems  The patient denies fatigue, malaise, fever, weight gain/loss, vision loss, decreased hearing, hoarseness, chest pain, palpitations, shortness of breath, prolonged cough, wheezing, sleep apnea, coughing up blood, abdominal pain, blood in stool, nausea, vomiting, diarrhea, heartburn, incontinence, blood in urine, muscle weakness, joint pain, leg swelling, rash, skin lesions, headache, fainting, dizziness,  depression, anxiety, enlarged lymph nodes, easy bruising or bleeding, and environmental allergies.    Vital Signs:  Patient profile:   70 year old female Height:      64.5 inches Weight:      165.25 pounds BMI:     28.03 Pulse rate:   83 / minute Pulse rhythm:   regular Resp:     18 per minute BP sitting:   112 / 67  (right arm) Cuff size:   large  Vitals Entered By: Vikki Ports (Oct 11, 2009 2:07 PM)  Physical Exam  General:  General: Well developed, well nourished, NAD Psychiatric: Mood and affect normal Neck: No JVD, no carotid bruits Lungs:Clear bilaterally, no  wheezes, rhonci, crackles CV: RRR no murmurs, gallops rubs Abdomen: soft, NT, ND, BS present Extremities: No edema, pulses 2+.    Echocardiogram  Procedure date:  09/29/2009  Findings:      Left ventricle: There was mild focal basal hypertrophy of the       septum. Systolic function was normal. The estimated ejection       fraction was in the range of 60% to 65%. Doppler parameters are       consistent with abnormal left ventricular relaxation (grade 1       diastolic dysfunction).     - Aortic valve: Mild regurgitation.  Nuclear Study  Procedure date:  09/29/2009  Findings:      Quantitative Gated Spect Images  QGS EDV:  60 ml QGS ESV:  13 ml QGS EF:  78 % QGS cine images:  Normal  Findings  Normal nuclear study  Overall Impression   Exercise Capacity: Lexiscan study with no exercise. ECG Impression: Baseline: NSR; No significant ST segment change with Lexiscan. Overall Impression: Normal stress nuclear study.   Impression & Recommendations:  Problem # 1:  CHEST PAIN (ICD-786.50) Non-cardiac in etiology. Stress test without ischemia. She has a history of radiation therapy to the chest. If she has recurrent chest pain and there is a suspicion for CAD, would recommend proceeding to cardiac cath at that time.   Problem # 2:  AORTIC INSUFFICIENCY (ICD-424.1) Mild. Will repeat echo in one year.   Her updated medication list for this problem includes:    Maxzide 75-50 Mg Tabs (Triamterene-hctz) .Marland Kitchen... 1/2 by mouth qd  Her updated medication list for this problem includes:    Maxzide 75-50 Mg Tabs (Triamterene-hctz) .Marland Kitchen... 1/2 by mouth qd  Patient Instructions: 1)  Your physician recommends that you schedule a follow-up appointment in: 12 months 2)  Your physician has requested that you have an echocardiogram.  Echocardiography is a painless test that uses sound waves to create images of your heart. It provides your doctor with information about the size and shape of  your heart and how well your heart's chambers and valves are working.  This procedure takes approximately one hour. There are no restrictions for this procedure. To be done in 12 months

## 2010-06-20 NOTE — Assessment & Plan Note (Signed)
Summary: exposure to MRSA, nose swab/lch   Vital Signs:  Patient profile:   70 year old female Height:      64.5 inches Weight:      165 pounds Temp:     98.3 degrees F oral Pulse rate:   85 / minute BP sitting:   118 / 62  (left arm)  Vitals Entered By: Jeremy Johann CMA (November 01, 2009 11:38 AM) CC: exposed to MRSA x1week ago Comments --refill REVIEWED MED LIST, PATIENT AGREED DOSE AND INSTRUCTION CORRECT    History of Present Illness: patient lost her sister-in-law last week, she was in close contact with her, the sister-in-law had documented MRSA before she died.  Allergies: 1)  ! Iodine 2)  ! Pepcid 3)  ! * Serzone 4)  ! Nsaids 5)  ! * Ivp Dye  Past History:  Past Medical History: Reviewed history from 10/11/2009 and no changes required. Diabetes mellitus, type II HTN Anxiety Mild aortic valve insufficiency Hyperlipidemia hypothyroidism  elevated LFTS , Fatty liver per CT 2009 Diverticulosis, colon OA cervical spine stenosis-- sees neuro surgery and Dr Ethelene Hal, s/p local injection 4-09 Breast cancer, hx of (DX 12- 2008) - S/P XRT (last 4-09) Urticaria onset 2/ 2011 Positive PPD, hx of, never treated (father died TB)  Past Surgical History: Reviewed history from 10/06/2009 and no changes required. Lumpectomy (2009) - left breast Appendectomy bilateral cataract surgery 2/11 Colon polyps Sinus cyst removed  Social History: Reviewed history from 12/23/2008 and no changes required. Married lives in [place] half of the time 2 kids Never Smoked Alcohol use-no Regular exercise-no   Review of Systems       denies rash, fever. As far as her sugars, she is on Lantus 15 units daily, CBGs in the morning are 160-140  Physical Exam  General:  alert and well-developed.   Lungs:  normal respiratory effort, no intercostal retractions, and no accessory muscle use.   Heart:  normal rate, regular rhythm, and no murmur.     Impression &  Recommendations:  Problem # 1:  ? of MRSA (ICD-041.12)  culture versus treatment? I favor treatment, most likely she has MRSA either her nose or skin. seee  instructions  Orders: Prescription Created Electronically [phone])  Problem # 2:  DIABETES MELLITUS, TYPE II (ICD-250.00) needs to increase Lantus Her updated medication list for this problem includes:    Metformin Hcl 500 Mg Tabs (Metformin hcl) .Marland Kitchen... 1 by mouth three times a day    Lantus Solostar 100 Unit/ml Soln (Insulin glargine) .Marland Kitchen... 20 units every night  Labs Reviewed: Creat: 0.8 (08/30/2009)     Last Eye Exam: normal (05/21/2008) Reviewed HgBA1c results: 7.9 (09/22/2009)  8.3 (07/04/2009)  Complete Medication List: 1)  Metformin Hcl 500 Mg Tabs (Metformin hcl) .Marland Kitchen.. 1 by mouth three times a day 2)  Ultram Tabs (Tramadol hcl tabs) 3)  Maxzide 75-50 Mg Tabs (Triamterene-hctz) .... 1/2 by mouth qd 4)  Synthroid 125 Mcg Tabs (Levothyroxine sodium) .Marland Kitchen.. 1 by mouth once daily 5)  Ambien 10 Mg Tabs (Zolpidem tartrate) .Marland Kitchen.. 1 by mouth at bedtime 6)  Ascensia Contour Test Strp (Glucose blood) 7)  Arimidex 1 Mg Tabs (Anastrozole) .... Take 1 tablet by mouth once a day 8)  Alprazolam Xr 0.5 Mg Tb24 (Alprazolam) .... 1/2 to 1 by mouth qid as needed anxiety or tremor as needed 9)  Mobic 7.5 Mg Tabs (Meloxicam) .Marland Kitchen.. 1 by mouth once daily prn 10)  Sinemet Cr 25-100 Mg Cr-tabs (Carbidopa-levodopa) .Marland KitchenMarland KitchenMarland Kitchen 1  by mouth at bedtime as needed leg cramps 11)  Limbril  .Marland Kitchen.. 1 po as needed 12)  Lantus Solostar 100 Unit/ml Soln (Insulin glargine) .... 20 units every night 13)  Pen Needles 31g X 6 Mm Misc (Insulin pen needle) .Marland Kitchen.. 1 daily with lantus 14)  Mupirocin 2 % Oint (Mupirocin) .... Twice a day for one week  Patient Instructions: 1)  mupirocin 2% twice a day for one week 2)  chlorhexidine wash daily with  4% solution for 7 days. Apply  to the wet skin from the neck to the toes specially in the skin folds, leave it x 1 minute. Rinse  with a shower 3)  increase Lantus to 20 units. Watch for low sugars. 4)  office visit in 6 weeks Prescriptions: PEN NEEDLES 31G X 6 MM MISC (INSULIN PEN NEEDLE) 1 daily with lantus  #1 box x 6   Entered and Authorized by:   Elita Quick E. Paz MD   Signed by:   Nolon Rod. Paz MD on 11/01/2009   Method used:   Electronically to        [facility] [phone]* (retail)       [address] [place], [place]  [address]       Ph: [phone]       Fax: [phone]   RxID:   [id number] ASCENS[facility] CONTOUR TEST  STRP (GLUCOSE BLOOD)   #100 x 6   Entered and Authorized by:   Nolon Rod. Paz MD   Signed by:   Nolon Rod. Paz MD on 11/01/2009   Method used:   Print then Give to Patient   RxID:   [id number] MUPIROCIN 2 % OINT (MUPIROCIN) twice a day for one week  #1 x 0   Entered and Authorized by:   Nolon Rod. Paz MD   Signed by:   Nolon Rod. Paz MD on 11/01/2009   Method used:   Electronically to        [facility] [phone]* (retail)       [address] [place], [place]  [address]       Ph: [phone]       Fax: [phone]   RxID:   [id number]

## 2010-06-20 NOTE — Progress Notes (Signed)
Summary: refill  Phone Note Refill Request Message from:  Patient on February 27, 2010 10:15 AM  Refills Requested: Medication #1:  AMBIEN 10 MG  TABS 1 by mouth at bedtime [facility]   Initial call taken by: Okey Regal [place],  February 27, 2010 10:15 AM  Follow-up for Phone Call        ok 90, no RF Jose E. Paz MD  February 27, 2010 1:02 PM     Prescriptions: AMBIEN 10 MG  TABS (ZOLPIDEM TARTRATE) 1 by mouth at bedtime  #90 x 0   Entered by:   Army Fossa CMA   Authorized by:   Nolon Rod. Paz MD   Signed by:   Army Fossa CMA on 02/27/2010   Method used:   Printed then faxed to ...       [address] [phone]* (retail)       [address] [place], [place]  [address]       Ph: [phone]       Fax: [phone]   RxID:   [id number]

## 2010-06-20 NOTE — Consult Note (Signed)
Summary: [facility]  [facility]   Imported By: Lanelle Bal 03/23/2010 08:38:06  _____________________________________________________________________  External Attachment:    Type:   Image     Comment:   External Document

## 2010-06-20 NOTE — Assessment & Plan Note (Signed)
Summary: Urticaria(allergy)/apc   Vital Signs:  Patient profile:   70 year old female Height:      64.5 inches Weight:      172 pounds O2 Sat:      95 % on Room air Pulse rate:   88 / minute BP sitting:   132 / 72  (left arm) Cuff size:   regular  Vitals Entered By: Reynaldo Minium CMA (Oct 06, 2009 3:03 PM)  O2 Flow:  Room air  Primary Provider/Referring Provider:  Nolon Rod. Paz MD  CC:  Allergy consult-Dr.Paz.  History of Present Illness: Oct 06, 2009- 70 yoF seen with her husband today at request of Dr Drue Novel because of hives. Never-smoker, diabetic, living 1/2 time in [place]. New onset, 3 months ago, never prior. Generalized urticarial lesions began 3 months ago. One day prior, while in [place] during the winter, she had gone to hospitalwith chest pain. Treated only with an acid blocker. When hives began, she stopped the new med. lesions have continued every day on face, legs and "hot/ intertriginous areas. She is not worse after hot shower. Has had no angioedema or wheeze. Benadryl 50 mg two times a day suppresses rash and itching but makes her drowsy. Since then, she was begun on insulin 10 days ago. She was given Limbrel- herbal supplement- for pain. Was on allergy vaccine for polen rhinitis as a  Sharon Armstrong woman, but not a recent problem.  On Arimidex since breast lumpectomy 2 years ago. We reviewed meds, noting absence of ACEI. She denies med changes in the interval prior to onset of rash, except as noted. Hc pos PPD, never treated. Contact dermatitis from several brands of toilet papet. Contrast dye- urticaria. Latex- contact dermatitis. Denies weight change, fever, nausea/ vomiting, bruising or bleeding, obvious infection.   Current Medications (verified): 1)  Metformin Hcl 500 Mg  Tabs (Metformin Hcl) .Marland Kitchen.. 1 By Mouth Three Times A Day 2)  Ultram   Tabs (Tramadol Hcl Tabs) 3)  Maxzide 75-50 Mg Tabs (Triamterene-Hctz) .... 1/2 By Mouth Qd 4)  Synthroid 125 Mcg Tabs (Levothyroxine  Sodium) .Marland Kitchen.. 1 By Mouth Once Daily 5)  Ambien 10 Mg  Tabs (Zolpidem Tartrate) .Marland Kitchen.. 1 By Mouth At Bedtime 6)  Ascensia Contour Test  Strp (Glucose Blood) 7)  Arimidex 1 Mg  Tabs (Anastrozole) 8)  Alprazolam Xr 0.5 Mg  Tb24 (Alprazolam) .... 1/2 To 1 By Mouth Qid As Needed Anxiety or Tremor As Needed 9)  Mobic 7.5 Mg Tabs (Meloxicam) .Marland Kitchen.. 1 By Mouth Once Daily Prn 10)  Sinemet Cr 25-100 Mg Cr-Tabs (Carbidopa-Levodopa) .Marland Kitchen.. 1 By Mouth At Bedtime As Needed Leg Cramps 11)  Limbril .Marland Kitchen.. 1 Po As Needed 12)  Lantus Solostar 100 Unit/ml Soln (Insulin Glargine) .Marland Kitchen.. 12 Units Qhs -  Put Rx On File - Pt Has Samples Right Now 13)  Pen Needles 31g X 6 Mm Misc (Insulin Pen Needle) .Marland Kitchen.. 1 Daily With Lantus  Allergies: 1)  ! Iodine 2)  ! Pepcid 3)  ! * Serzone 4)  ! Nsaids 5)  ! * Ivp Dye  Past History:  Family History: Last updated: 10/06/2009 CAD - no HTN - M DM - no stroke - no colon Ca - no breast Ca - no M- deceased lung cancer  F - died TB age 19  Social History: Last updated: 12/23/2008 Married lives in [place] half of the time 2 kids Never Smoked Alcohol use-no Regular exercise-no   Risk Factors: Alcohol Use: 0 (12/23/2008) Exercise: no (  12/23/2008)  Risk Factors: Smoking Status: never (12/23/2008)  Past Medical History: Diabetes mellitus, type II HTN Anxiety Hyperlipidemia hypothyroidism  elevated LFTS , Fatty liver per CT 2009 Diverticulosis, colon OA cervical spine stenosis-- sees neuro surgery and Dr Ethelene Hal, s/p local injection 4-09 Breast cancer, hx of (DX 12- 2008) - S/P XRT (last 4-09) Urticaria onset 2/ 2011 Positive PPD, hx of, never treated (father died TB)  Past Surgical History: Lumpectomy (2009) - left breast Appendectomy bilateral cataract surgery 2/11 Colon polyps Sinus cyst removed  Family History: CAD - no HTN - M DM - no stroke - no colon Ca - no breast Ca - no M- deceased lung cancer  F - died TB age 35  Review of Systems        The patient complains of shortness of breath with activity, chest pain, irregular heartbeats, acid heartburn, itching, anxiety, depression, joint stiffness or pain, and rash.  The patient denies shortness of breath at rest, productive cough, non-productive cough, coughing up blood, indigestion, loss of appetite, weight change, abdominal pain, difficulty swallowing, sore throat, tooth/dental problems, headaches, nasal congestion/difficulty breathing through nose, sneezing, ear ache, hand/feet swelling, change in color of mucus, and fever.    Physical Exam  Additional Exam:  General: A/Ox3; pleasant and cooperative, NAD, SKIN: no rash, lesions. She can't demonstrate any urticaria at this exam. NODES: no lymphadenopathy HEENT: Hitchcock/AT, EOM- WNL, Conjuctivae- clear, PERRLA, TM-WNL, Nose- clear, Throat- clear and wnl, Mallampati  II NECK: Supple w/ fair ROM, JVD- none, normal carotid impulses w/o bruits Thyroid- normal to palpation CHEST: Clear to P&A HEART: RRR, no m/g/r heard ABDOMEN: Soft and nl; nml bowel sounds; no organomegaly or masses noted QIH:KVQQ, nl pulses, no edema  NEURO: Grossly intact to observation      Impression & Recommendations:  Problem # 1:  URTICARIA (ICD-708.9)  Urticaria based on her description,of 3 months duration- ideopathic. Discussed foods- MSG, Detergent/ fabric softeners, hormones. Urticaria can be associated with a number of underlying diseases, including DM, liver disease, coneective tissue disorders, cancers and infections. She is not on hormones.Most commonly this subacute pattern will resolve with no cause ever identified. We will send some pertinent labs and try blocking with a nondrowsy antihistamine.  Other Orders: Consultation Level IV [phone]) T-"RAST" (Allergy Full Profile) IGE ([phone]) T- * Misc. Laboratory test [phone])  Patient Instructions: 1)  Please schedule a follow-up appointment in 1 month. 2)  Lab 3)  Try for a month without enzyme  augmented laundry products, antibacterial soaps and etc.  4)  Suggest otc nondrowsy antihistamines: 5)  Loratadine/ Claritin 6)  fexofenadine/ Allegra 60 mg or 180 mg

## 2010-06-20 NOTE — Progress Notes (Signed)
Summary: result urine   Phone Note Call from Patient Call back at Home Phone [phone]   Summary of Call: patient said she didnt receive a call about urine culture - wanted to know result  Initial call taken by: Okey Regal Spring,  Sep 28, 2009 1:40 PM  Follow-up for Phone Call        discussed with pt [name]  Sep 28, 2009 2:35 PM

## 2010-06-20 NOTE — Letter (Signed)
Summary: Colonoscopy Letter  [facility]  [address] [place], [place] [address]   Phone: [phone]  Fax: [phone]      October 28, 2009 MRN: [id number]   [facility] [place] [address] [place], [place]  [address]   Dear Ms. Pro,   According to your medical record, it is time for you to schedule a Colonoscopy. The American Cancer Society recommends this procedure as a method to detect early colon cancer. Patients with a family history of colon cancer, or a personal history of colon polyps or inflammatory bowel disease are at increased risk.  This letter has beeen generated based on the recommendations made at the time of your procedure. If you feel that in your particular situation this may no longer apply, please contact our office.  Please call our office at [phone] to schedule this appointment or to update your records at your earliest convenience.  Thank you for cooperating with [place] to provide you with the very best care possible.   Sincerely,  Judie Petit T. Russella Dar, M.D.  [facility] [phone]

## 2010-06-20 NOTE — Progress Notes (Signed)
Summary: REFILL   Phone Note Refill Request Message from:  Fax from Pharmacy on Sep 28, 2009 10:17 AM  Refills Requested: Medication #1:  TRIAMT/HCTZ 37.5-25 TAB FAX FROM [facility] [address] - [place] - FAX [phone] [facility] [phone] - NOTE ON FAX "THIS IS UNAVAILABLE FROM [employer] YOU PLEASE CHANGE TO SOMETHING ELSE"   Method Requested: Fax to Local Pharmacy Next Appointment Scheduled: NONE Initial call taken by: Okey Regal Spring,  Sep 28, 2009 10:21 AM  Follow-up for Phone Call        spoke with pharmacist, 75/50 is available & is scored.  However, FDA has pulled 37.5/25 because it is an older drug & does not meet criteria so it will have to be reformulated.  Pharmacist believes they will do the same thing with the 75/50 in the future. [name]  Sep 28, 2009 10:44 AM   Additional Follow-up for Phone Call Additional follow up Details #1::        for now call  T/HCTZ  75/50mg    half tab  daily Jose E. Paz MD  Sep 28, 2009 3:49 PM     New/Updated Medications: MAXZIDE 75-50 MG TABS (TRIAMTERENE-HCTZ) 1/2 by mouth qd Prescriptions: MAXZIDE 75-50 MG TABS (TRIAMTERENE-HCTZ) 1/2 by mouth qd  #30 x 1   Entered by:   [name]   Authorized by:   Nolon Rod. Paz MD   Signed by:   [name] on 09/28/2009   Method used:   Electronically to        [facility] [phone]* (retail)       [address] [place], [place]  [address]       Ph: [phone]       Fax: [phone]   RxID:   [id number]

## 2010-06-20 NOTE — Letter (Signed)
Summary: *Referral Letter--Rheumatology  [facility]  [address] [place], [place] [address]   Phone: [phone]  Fax: [phone]      01/19/2010 Rheumatology   Thank you in advance for agreeing to see my patient:  Sharon Armstrong [address] [place], [place]  [address]  Phone: [phone]  Reason for Referral:  Tessi is a 70 year old female with several years history of chronic fatigue, generalized aches, chronic back pain and severe anxiety.  Her blood work in the past has never show an anemia, her thyroid has been usually well controlled, multiple sed rates have been normal. A few weeks ago I started her on Prozac which helped only modestly.  Please help [place] to  rule out an auto-immune disease...other consideration would be fibromyalgia.  Please note thet she  reports a tick bite 8 years ago, she has a picture of it, she did develop a rash afterwards. She also has developed recently a malar rash on and off, I don't know if this represents an auti-inmune disease or rosacea. Her symptoms started even before she was prescribed Arimidex.  If your workup is negative for  rheumatologic diseases I will concentrate on the treatment of fibromyalgia and anxiety.     Current Medications: 1)  LANTUS SOLOSTAR 100 UNIT/ML SOLN (INSULIN GLARGINE) 25 units every night 2)  METFORMIN HCL 500 MG  TABS (METFORMIN HCL) 1 by mouth three times a day 3)  ULTRAM   TABS (TRAMADOL HCL TABS)  4)  MAXZIDE 75-50 MG TABS (TRIAMTERENE-HCTZ) 1/2 by mouth qd 5)  SYNTHROID 125 MCG TABS (LEVOTHYROXINE SODIUM) 1 by mouth once daily 6)  AMBIEN 10 MG  TABS (ZOLPIDEM TARTRATE) 1 by mouth at bedtime 7)  ASCENSIA CONTOUR TEST  STRP (GLUCOSE BLOOD)  8)  ARIMIDEX 1 MG  TABS (ANASTROZOLE) Take 1 tablet by mouth once a day 9)  ALPRAZOLAM XR 0.5 MG  TB24 (ALPRAZOLAM) 1/2 to 1 by mouth qid as needed anxiety or tremor as needed 10)  MOBIC 7.5 MG TABS (MELOXICAM) 1 by mouth once daily  prn 11)  SINEMET CR 25-100 MG CR-TABS (CARBIDOPA-LEVODOPA) 1 by mouth at bedtime as needed leg cramps 12)  * LIMBRIL 1 po as needed 13)  PEN NEEDLES 31G X 6 MM MISC (INSULIN PEN NEEDLE) 1 daily with lantus 14)  MUPIROCIN 2 % OINT (MUPIROCIN) twice a day for one week 15)  FLUOXETINE HCL 20 MG TABS (FLUOXETINE HCL) 1/2 tab a day x 10 days, then 1 a day 16)  ONETOUCH DELICA LANCETS  MISC (LANCETS) check BS two times a day 17)  ONETOUCH ULTRA TEST  STRP (GLUCOSE BLOOD) check BS two times a day   Past Medical History: 1)  Diabetes mellitus, type II 2)  HTN 3)  Anxiety 4)  Mild aortic valve insufficiency 5)  Hyperlipidemia 6)  hypothyroidism  7)  elevated LFTS , Fatty liver per CT 2009 8)  Diverticulosis, colon 9)  OA 10)  cervical spine stenosis-- sees neuro surgery and Dr Ethelene Hal, s/p local injection 4-09 11)  Breast cancer, hx of (DX 12- 2008) - S/P XRT (last 4-09) 12)  Urticaria onset 2/ 2011 13)  Positive PPD, hx of, 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  Sincerely,in the and       Frimet Durfee E. Otto Felkins MD

## 2010-06-20 NOTE — Assessment & Plan Note (Signed)
Summary: 1 month followup/kn   Vital Signs:  Patient profile:   70 year old female Weight:      162.50 pounds Pulse rate:   86 / minute Pulse rhythm:   regular BP sitting:   126 / 80  (left arm) Cuff size:   regular  Vitals Entered By: Army Fossa CMA (January 19, 2010 11:25 AM) CC: Pt here for f/u. Comments discuss BS readings discuss Flu Shot.   History of Present Illness: here for followup of her last office visit  Current Medications (verified): 1)  Lantus Solostar 100 Unit/ml Soln (Insulin Glargine) .... 25 Units Every Night 2)  Metformin Hcl 500 Mg  Tabs (Metformin Hcl) .Marland Kitchen.. 1 By Mouth Three Times A Day 3)  Ultram   Tabs (Tramadol Hcl Tabs) 4)  Maxzide 75-50 Mg Tabs (Triamterene-Hctz) .... 1/2 By Mouth Qd 5)  Synthroid 125 Mcg Tabs (Levothyroxine Sodium) .Marland Kitchen.. 1 By Mouth Once Daily 6)  Ambien 10 Mg  Tabs (Zolpidem Tartrate) .Marland Kitchen.. 1 By Mouth At Bedtime 7)  Ascensia Contour Test  Strp (Glucose Blood) 8)  Arimidex 1 Mg  Tabs (Anastrozole) .... Take 1 Tablet By Mouth Once A Day 9)  Alprazolam Xr 0.5 Mg  Tb24 (Alprazolam) .... 1/2 To 1 By Mouth Qid As Needed Anxiety or Tremor As Needed 10)  Mobic 7.5 Mg Tabs (Meloxicam) .Marland Kitchen.. 1 By Mouth Once Daily Prn 11)  Sinemet Cr 25-100 Mg Cr-Tabs (Carbidopa-Levodopa) .Marland Kitchen.. 1 By Mouth At Bedtime As Needed Leg Cramps 12)  Limbril .Marland Kitchen.. 1 Po As Needed 13)  Pen Needles 31g X 6 Mm Misc (Insulin Pen Needle) .Marland Kitchen.. 1 Daily With Lantus 14)  Mupirocin 2 % Oint (Mupirocin) .... Twice A Day For One Week 15)  Fluoxetine Hcl 20 Mg Tabs (Fluoxetine Hcl) .... 1/2 Tab A Day X 10 Days, Then 1 A Day 16)  Onetouch Delica Lancets  Misc (Lancets) .... Check Bs Two Times A Day 17)  Onetouch Ultra Test  Strp (Glucose Blood) .... Check Bs Two Times A Day  Allergies: 1)  ! Iodine 2)  ! Pepcid 3)  ! * Serzone 4)  ! Nsaids 5)  ! * Ivp Dye  Past History:  Past Medical History: Reviewed history from 10/11/2009 and no changes required. 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 Lumpectomy (2009) - left breast Appendectomy bilateral cataract surgery 2/11 Colon polyps Sinus cyst removed  Social History: Reviewed history from 12/23/2008 and no changes required. Married lives in [place] half of the time 2 kids Never Smoked Alcohol use-no Regular exercise-no   Review of Systems       she started Prozac, is making her more calm and at the same time is making her slightly sleepy and not as sharp. In general she does not think that she "feels better" Ambulatory CBGs in the morning in the 120s, in the afternoon 100 to 120 She has developed on and off rash in her cheeks  Physical Exam  General:  alert and well-developed.   Head:  no rash in her cheeks today   Impression & Recommendations:  Problem #  1:  ANXIETY (ICD-300.00) see previous entry she is taking fluoxetine, not helping her that much although she recognizes that she is calmer. I think that given all the symptoms that she has (fatigue, generalized aches and pains ,  chronic back pain, anxiety) she needs to be rule out for fibromyalgia. She has also now a malar  rash on and off which could be a sign of lupus or rosacea Of note this is that she started Arimidex in 2009 and the symptoms preceded Arimidex plan: Refer to rheumatology to rule out fibromyalgia---see referal letter  Increase fluoxetine? Patient declined at this time  Her updated medication list for this problem includes:    Alprazolam Xr 0.5 Mg Tb24 (Alprazolam) .Marland Kitchen... 1/2 to 1 by mouth qid  as needed anxiety or tremor as needed    Fluoxetine Hcl 20 Mg Tabs (Fluoxetine hcl) .Marland Kitchen... 1/2 tab a day x 10 days, then 1 a day  Problem # 2:  DIABETES MELLITUS, TYPE II (ICD-250.00) no change for now Her updated medication list for this problem includes:    Lantus Solostar 100 Unit/ml Soln (Insulin glargine) .Marland Kitchen... 25 units every night    Metformin Hcl 500 Mg Tabs (Metformin hcl) .Marland Kitchen... 1 by mouth three times a day  Labs Reviewed: Creat: 0.8 (08/30/2009)     Last Eye Exam: normal (05/21/2008) Reviewed HgBA1c results: 7.9 (09/22/2009)  8.3 (07/04/2009)  Complete Medication List: 1)  Lantus Solostar 100 Unit/ml Soln (Insulin glargine) .... 25 units every night 2)  Metformin Hcl 500 Mg Tabs (Metformin hcl) .Marland Kitchen.. 1 by mouth three times a day 3)  Ultram Tabs (Tramadol hcl tabs) 4)  Maxzide 75-50 Mg Tabs (Triamterene-hctz) .... 1/2 by mouth qd 5)  Synthroid 125 Mcg Tabs (Levothyroxine sodium) .Marland Kitchen.. 1 by mouth once daily 6)  Ambien 10 Mg Tabs (Zolpidem tartrate) .Marland Kitchen.. 1 by mouth at bedtime 7)  Ascensia Contour Test Strp (Glucose blood) 8)  Arimidex 1 Mg Tabs (Anastrozole) .... Take 1 tablet by mouth once a day 9)  Alprazolam Xr 0.5 Mg Tb24 (Alprazolam) .... 1/2 to 1 by mouth qid as needed anxiety or tremor as needed 10)  Mobic 7.5 Mg Tabs (Meloxicam) .Marland Kitchen.. 1 by mouth once daily prn 11)  Sinemet Cr 25-100 Mg Cr-tabs (Carbidopa-levodopa) .Marland Kitchen.. 1 by mouth at bedtime as needed leg cramps 12)  Limbril  .Marland Kitchen.. 1 po as needed 13)  Pen Needles 31g X 6 Mm Misc (Insulin pen needle) .Marland Kitchen.. 1 daily with lantus 14)  Mupirocin 2 % Oint (Mupirocin) .... Twice a day for one week 15)  Fluoxetine Hcl 20 Mg Tabs (Fluoxetine hcl) .... 1/2 tab a day x 10 days, then 1 a day 16)  Onetouch Delica Lancets Misc (Lancets) .... Check bs two times a day 17)  Onetouch Ultra Test Strp (Glucose blood) .... Check bs two times a day  Other Orders: Flu Vaccine 7yrs + (54098) Administration Flu vaccine - MCR (J1914) Rheumatology  Referral (Rheumatology) Flu Vaccine Consent Questions     Do you have a history of severe allergic reactions to this vaccine? no    Any prior history of allergic reactions to egg and/or gelatin? no    Do you have a sensitivity to the preservative Thimersol? no    Do you have a past history of Guillan-Barre Syndrome? no    Do you currently have an acute febrile illness? no    Have you ever had a severe reaction to latex? no    Vaccine information given and explained  to patient? yes    Are you currently pregnant? no    Lot Number:[id number]   Exp Date:11/18/2010   Site Given  Left Deltoid IM  Patient Instructions: 1)  Please schedule a follow-up appointment in 2 months.    Marland Kitchenlbmedflu

## 2010-06-20 NOTE — Assessment & Plan Note (Signed)
Summary: RASH --CP   Vital Signs:  Patient profile:   71 year old female Height:      64.5 inches Weight:      169.8 pounds BMI:     28.80 Temp:     98.8 degrees F BP sitting:   120 / 84  Vitals Entered By: [name] (August 30, 2009 9:38 AM) CC: hives off & on x 7 weeks, no exposure to anything new, +relief with benadry but once it "wears off the hives come back" Comments Patient states that when she was in [place] she continued to have chest pain.  "so bad that I passed out & my cancer MD in [place] suggested that Dr. Drue Novel order a MRI because the chest pain matches up with c4" [name]  August 30, 2009 9:45 AM    History of Present Illness: RASH hives off & on x 7 weeks, no exposure to anything new, +relief with benadryl  but once it "wears off the hives come back" heights are located centrally at the back, abdomen, pelvis > Extremities. No other phase + pruritus ROS: not taken any new medications denies fevers she admits to  aches, points to her proximal arms and legs. Mild headaches, slightly worse lately? No amaurosis fugax  ALSO C/O CP: chest pains on and off for more than a year, initially every 6 months, getting more frequent and intense pain is at the suprasternal area, rad. to her back last 20- 30 minutes no associated nausea, vomiting, diaphoresis, no palpitations pain was so intense that 7 weeks ago she passed out, went to the ER in [place], a workup was done  but did not include a stress test.  review of systems  no cough mild GERD symptoms    Current Medications (verified): 1)  Metformin Hcl 500 Mg  Tabs (Metformin Hcl) .... Three Times A Day 2)  Glimepiride 4 Mg  Tabs (Glimepiride) .Marland Kitchen.. 1 By Mouth Two Times A Day 3)  Ultram   Tabs (Tramadol Hcl Tabs) 4)  Maxzide-25 37.5-25 Mg  Tabs (Triamterene-Hctz) .Marland Kitchen.. 1 By Mouth Once Daily 5)  Synthroid 125 Mcg Tabs (Levothyroxine Sodium) .Marland Kitchen.. 1 By Mouth Once Daily 6)  Ambien 10 Mg  Tabs (Zolpidem Tartrate) .Marland Kitchen.. 1 By  Mouth At Bedtime 7)  Ascensia Contour Test  Strp (Glucose Blood) 8)  Arimidex 1 Mg  Tabs (Anastrozole) 9)  Alprazolam Xr 0.5 Mg  Tb24 (Alprazolam) .... 1/2 To 1 By Mouth Qid As Needed Anxiety or Tremor As Needed 10)  Mobic 7.5 Mg Tabs (Meloxicam) .Marland Kitchen.. 1 By Mouth Once Daily 11)  Sinemet Cr 25-100 Mg Cr-Tabs (Carbidopa-Levodopa) .Marland Kitchen.. 1 By Mouth At Bedtime As Needed Leg Cramps 12)  Flonase 50 Mcg/act Susp (Fluticasone Propionate) .... 2 Sprays On Each Side of The Nose  Allergies (verified): 1)  ! Iodine 2)  ! Pepcid 3)  ! * Serzone 4)  ! Nsaids  Past History:  Past Medical History: Reviewed history from 12/23/2008 and no changes required. Diabetes mellitus, type II HTN Anxiety Hyperlipidemia hypothyroidism  elevated LFTS , Fatty liver per CT 2009 Diverticulosis, colon OA cervical spine stenosis-- sees neuro surgery and Dr Ethelene Hal, s/p local injection 4-09 Breast cancer, hx of (DX 12- 2008) - S/P XRT (last 4-09)  Past Surgical History: Lumpectomy (2009) - left breast Appendectomy bilateral cataract surgery 2/11  Social History: Reviewed history from 12/23/2008 and no changes required. Married lives in [place] half of the time 2 kids Never Smoked Alcohol use-no Regular exercise-no  Physical Exam  General:  alert, well-developed, and well-nourished.   Lungs:  normal respiratory effort, no intercostal retractions, no accessory muscle use, and normal breath sounds.   Heart:  normal rate, regular rhythm, no murmur, and no gallop.   Extremities:  no edema   Impression & Recommendations:  Problem # 1:  CHEST PAIN (ICD-786.50) I'm concerned about her chest pain in a patient with diabetes,   hypertension and high cholesterol pt  thinks it is rather a problem related to XRT that she go to for breast cancer , nevertheless I think she needs further workup. She may be a candidate for cardiac catheterization rather than a stress test. Plan: EKG-- no change from baseline chest  x-ray cardiology eval get records from [place] ER if symptoms severe  Orders: TLB-BMP (Basic Metabolic Panel-BMET) (80048-METABOL) T-2 View CXR (71020TC) Cardiology Referral (Cardiology)  Problem # 2:  URTICARIA (ICD-708.9) 6 weeks history of hives symptoms started before her cataract surgery consequently not related to any medication given at that time she also has proximal myalgias but no true arthralgias  labs continue with Benadryl allergy referral  Orders: Venipuncture ([address]) TLB-CBC Platelet - w/Differential (85025-CBCD) TLB-Sedimentation Rate (ESR) (85652-ESR) TLB-CK Total Only(Creatine Kinase/CPK) (82550-CK) Allergy Referral  (Allergy)  Problem # 3:  chronic medical problems return to the office in 3 weeks, get records from [place]  Complete Medication List: 1)  Metformin Hcl 500 Mg Tabs (Metformin hcl) .... Three times a day 2)  Glimepiride 4 Mg Tabs (Glimepiride) .Marland Kitchen.. 1 by mouth two times a day 3)  Ultram Tabs (Tramadol hcl tabs) 4)  Maxzide-25 37.5-25 Mg Tabs (Triamterene-hctz) .Marland Kitchen.. 1 by mouth once daily 5)  Synthroid 125 Mcg Tabs (Levothyroxine sodium) .Marland Kitchen.. 1 by mouth once daily 6)  Ambien 10 Mg Tabs (Zolpidem tartrate) .Marland Kitchen.. 1 by mouth at bedtime 7)  Ascensia Contour Test Strp (Glucose blood) 8)  Arimidex 1 Mg Tabs (Anastrozole) 9)  Alprazolam Xr 0.5 Mg Tb24 (Alprazolam) .... 1/2 to 1 by mouth qid as needed anxiety or tremor as needed 10)  Mobic 7.5 Mg Tabs (Meloxicam) .Marland Kitchen.. 1 by mouth once daily 11)  Sinemet Cr 25-100 Mg Cr-tabs (Carbidopa-levodopa) .Marland Kitchen.. 1 by mouth at bedtime as needed leg cramps 12)  Flonase 50 Mcg/act Susp (Fluticasone propionate) .... 2 sprays on each side of the nose  Patient Instructions: 1)  please get the records from her doctors in [place] including the ER records 2)  Please schedule a follow-up appointment in 3  weeks. fasting

## 2010-06-20 NOTE — Op Note (Signed)
Summary: Cervical & Lumbar Epidural Steroid Injections/[facility]  Cervical & Lumbar Epidural Steroid Injections/[facility]   Imported By: Lanelle Bal 03/30/2010 13:59:06  _____________________________________________________________________  External Attachment:    Type:   Image     Comment:   External Document

## 2010-06-20 NOTE — Letter (Signed)
SummaryDeboraha Armstrong IM @ [facility]   Imported By: Lanelle Bal 03/29/2010 09:32:25  _____________________________________________________________________  External Attachment:    Type:   Image     Comment:   External Document

## 2010-06-20 NOTE — Procedures (Signed)
Summary: Colonoscopy  Patient: Sharon Armstrong Note: All result statuses are Final unless otherwise noted.  Tests: (1) Colonoscopy (COL)   COL Colonoscopy           DONE     [facility]     [address]     [place], [place]  [address]           COLONOSCOPY PROCEDURE REPORT           PATIENT:  [name], [name]  MR#:  [id number]     BIRTHDATE:  1940-11-27, 68 yrs. old  GENDER:  female     ENDOSCOPIST:  Judie Petit T. Russella Dar, MD, [facility]           PROCEDURE DATE:  03/15/2010     PROCEDURE:  Colonoscopy with snare polypectomy     ASA CLASS:  Class II     INDICATIONS:  1) surveillance and high-risk screening  2) history     of pre-cancerous (adenomatous) colon polyps: 11/1998, 09/2004.     MEDICATIONS:   Fentanyl 100 mcg IV, Versed 10 mg IV, Benadryl 25     mg IV     DESCRIPTION OF PROCEDURE:   After the risks benefits and     alternatives of the procedure were thoroughly explained, informed     consent was obtained.  Digital rectal exam was performed and     revealed no abnormalities.   The LB [id number] endoscope     was introduced through the anus and advanced to the cecum, which     was identified by both the appendix and ileocecal valve, without     limitations.  The quality of the prep was excellent, using     MoviPrep.  The instrument was then slowly withdrawn as the colon     was fully examined.     <<PROCEDUREIMAGES>>     FINDINGS:  A sessile polyp was found in the cecum. It was 5 mm in     size. Polyp was snared without cautery. Retrieval was successful.     Two polyps were found in the proximal transverse colon. They were     4 - 5 mm in size. Polyps were snared without cautery. Retrieval     was successful.  A sessile polyp was found in the descending     colon. It was 6 mm in size. Polyp was snared without cautery.     Retrieval was successful. Mild diverticulosis was found in the     sigmoid colon. This was otherwise a normal examination of the     colon.   Retroflexed views in the rectum revealed internal     hemorrhoids, moderate.  The time to cecum =  3  minutes. The scope     was then withdrawn (time =  11  min) from the patient and the     procedure completed.           COMPLICATIONS:  None           ENDOSCOPIC IMPRESSION:     1) 5 mm sessile polyp in the cecum     2) 4 - 5 mm, two polyps in the proximal transverse colon     3) 6 mm sessile polyp in the descending colon     4) Mild diverticulosis in the sigmoid colon     5) Internal hemorrhoids           RECOMMENDATIONS:     1) Await pathology results  2) High fiber diet with liberal fluid intake.     3) Repeat Colonoscopy in 3 years if polyps adenomatous otherwise     5 years.           Venita Lick. Russella Dar, MD, [facility]           CC: Willow Ora, MD           n.     Rosalie DoctorVenita Lick. Stark at 03/15/2010 11:22 AM           [place], [id number]  Note: An exclamation mark (!) indicates a result that was not dispersed into the flowsheet. Document Creation Date: 03/15/2010 11:23 AM _______________________________________________________________________  (1) Order result status: Final Collection or observation date-time: 03/15/2010 11:17 Requested date-time:  Receipt date-time:  Reported date-time:  Referring Physician:   Ordering Physician: Claudette Head [phone]) Specimen Source:  Source: [facility] Order Number: [id number] Lab site:   Appended Document: Colonoscopy     Procedures Next Due Date:    Colonoscopy: 02/2013

## 2010-06-20 NOTE — Assessment & Plan Note (Signed)
Summary: lantus teaching/swh  Medications Added Allergies: METFORMIN HCL 500 MG  TABS (METFORMIN HCL) 2 by mouth two times a day METFORMIN HCL 500 MG  TABS (METFORMIN HCL) 1 by mouth three times a day METFORMIN HCL 500 MG  TABS (METFORMIN HCL) three times a day GLIMEPIRIDE 4 MG  TABS (GLIMEPIRIDE) 1 by mouth two times a day GLIMEPIRIDE 4 MG  TABS (GLIMEPIRIDE) 1 by mouth two times a day GLIMEPIRIDE 4 MG  TABS (GLIMEPIRIDE) 1 by mouth in the am ; 1/2 tab in the evening GLIMEPIRIDE 4 MG  TABS (GLIMEPIRIDE) two times a day GLIMEPIRIDE 4 MG  TABS (GLIMEPIRIDE) 1 by mouth once daily PROVERA 2.5 MG  TABS (MEDROXYPROGESTERONE ACETATE) 1 by mouth once daily PROVERA 2.5 MG  TABS (MEDROXYPROGESTERONE ACETATE) 1 by mouth once daily OGEN 0.625   TABS (ESTROPIPATE TABS) 1 by mouth once daily OGEN 0.625   TABS (ESTROPIPATE TABS) 1 by mouth once daily ULTRAM   TABS (TRAMADOL HCL TABS)  MAXZIDE-25 37.5-25 MG  TABS (TRIAMTERENE-HCTZ) 1 by mouth once daily SYNTHROID 137 MCG TABS (LEVOTHYROXINE SODIUM) 1 by mouth once daily SYNTHROID 125 MCG TABS (LEVOTHYROXINE SODIUM) 1 by mouth once daily SYNTHROID 150 MCG  TABS (LEVOTHYROXINE SODIUM) 1 by mouth once daily AMBIEN 10 MG  TABS (ZOLPIDEM TARTRATE) 1 by mouth at bedtime METFORMIN HCL 500 MG  TABS (METFORMIN HCL) 1 by mouth two times a day ASCENSIA CONTOUR TEST  STRP (GLUCOSE BLOOD)  ARIMIDEX 1 MG  TABS (ANASTROZOLE)  NAPROSYN 500 MG  TABS (NAPROXEN) 1 by mouth bids w/ food as needed pain NAPROSYN 500 MG  TABS (NAPROXEN) 1 by mouth bids w/ food as needed pain PRILOSEC OTC 20 MG  TBEC (OMEPRAZOLE MAGNESIUM) 1 a day before breakfast PRILOSEC OTC 20 MG  TBEC (OMEPRAZOLE MAGNESIUM) 1 a day before breakfast LIMBREL 500 MG  CAPS (FLAVOCOXID) bid LIMBREL 500 MG  CAPS (FLAVOCOXID) bid ALPRAZOLAM XR 0.5 MG  TB24 (ALPRAZOLAM) 1/2 to 1 by mouth qid as needed anxiety or tremor as needed ALPRAZOLAM XR 0.5 MG  TB24 (ALPRAZOLAM) 1/2 to 1 by mouth qid as needed anxiety or  tremor * MOBIC as needed MOBIC 7.5 MG TABS (MELOXICAM) 1 by mouth once daily MOBIC 7.5 MG TABS (MELOXICAM) 1 by mouth once daily prn WELCHOL 625 MG TABS (COLESEVELAM HCL) 2 by mouth two times a day WELCHOL 625 MG TABS (COLESEVELAM HCL) 2 by mouth two times a day SINEMET CR 25-100 MG CR-TABS (CARBIDOPA-LEVODOPA) 1 by mouth at bedtime as needed leg cramps FLONASE 50 MCG/ACT SUSP (FLUTICASONE PROPIONATE) 2 sprays on each side of the nose prn FLONASE 50 MCG/ACT SUSP (FLUTICASONE PROPIONATE) 2 sprays on each side of the nose PRILOSEC 20 MG CPDR (OMEPRAZOLE) 1 by mouth daily * LIMBRIL 1 po as needed LANTUS SOLOSTAR 100 UNIT/ML SOLN (INSULIN GLARGINE) 7 UNITS QHS -  PUT RX ON FILE - PT HAS SAMPLES RIGHT NOW PEN NEEDLES 31G X 6 MM MISC (INSULIN PEN NEEDLE) 1 daily with lantus       Nurse Visit   Vital Signs:  Patient profile:   70 year old female Weight:      170 pounds  Vitals Entered By: [name] (Sep 27, 2009 2:27 PM) CC: LANTUS TEACHING   Allergies: 1)  ! Iodine 2)  ! Pepcid 3)  ! * Serzone 4)  ! Nsaids  Orders Added: 1)  No Charge Patient Arrived (NCPA0) [NCPA0] Prescriptions: LANTUS SOLOSTAR 100 UNIT/ML SOLN (INSULIN GLARGINE) 7 UNITS QHS -  PUT  RX ON FILE - PT HAS SAMPLES RIGHT NOW  #1 MO SUPPLY x 11   Entered by:   [name]   Authorized by:   Nolon Rod. Shakenya Stoneberg MD   Signed by:   [name] on 09/27/2009   Method used:   Electronically to        [facility] [phone]* (retail)       [address] [place], [place]  [address]       Ph: [phone]       Fax: [phone]   RxID:   [id number] PEN NEEDLES 31G X 6 MM MISC (INSULIN PEN NEEDLE) 1 daily with lantus  #1 box x prn   Entered by:   [name]   Authorized by:   Nolon Rod. Teonia Yager MD   Signed by:   [name] on 09/27/2009   Method used:   Electronically to        [facility] [phone]* (retail)       [address] [place], [place]  [address]       Ph: [phone]       Fax: [phone]    RxID:   [id number]    Patient Instructions: 1)  7 units of lantus every pm 2)  call with blood sugar readings in 1 week 3)  [phone] ext [phone]

## 2010-06-20 NOTE — Progress Notes (Signed)
Summary: BS readings  Phone Note Call from Patient Call back at Home Phone [phone]   Caller: Patient Summary of Call: Pt on Lantus 12 u daily started 10/03/09,   c/o feeling nauseous on thur and threw up, nausea every since. Eating crackers and water, today is 1st day feel a little better BS readings FASTING 145,149,131 skipped 1 day feeling so bad BS reading NON FASTING; 156,151,147 skipped 1 day HS reading 239,124,156,156,137,203 (took Gatorade mixed with water to get electrolytes up)         Initial call taken by: Kandice Hams,  Oct 10, 2009 1:32 PM  Follow-up for Phone Call        increase Lantus from 12 to 15 units office visit if not better in few days  call with readings in 2 weeks Jose E. Paz MD  Oct 10, 2009 1:52 PM   Additional Follow-up for Phone Call Additional follow up Details #1::        pt informed of new dosage of Lantus and new dose put on med list.  Pt informed of Dr Drue Novel recommendations .Kandice Hams  Oct 10, 2009 2:17 PM  Additional Follow-up by: Kandice Hams,  Oct 10, 2009 2:17 PM    New/Updated Medications: LANTUS SOLOSTAR 100 UNIT/ML SOLN (INSULIN GLARGINE) 15 UNITS QHS -  PUT RX ON FILE - PT HAS SAMPLES RIGHT NOW

## 2010-06-20 NOTE — Progress Notes (Signed)
Summary: Questions about procedure  Phone Note Call from Patient Call back at [phone]   Caller: Patient Call For: Dr. Russella Dar Reason for Call: Talk to Nurse Summary of Call: pt. is having a COL and having some questions about the "injections" Initial call taken by: Karna Christmas,  March 08, 2010 11:49 AM  Follow-up for Phone Call        No answer Follow-up by: Wyona Almas RN,  March 08, 2010 12:44 PM  Additional Follow-up for Phone Call Additional follow up Details #1::        No answer at home number or the number pt. gave Additional Follow-up by: Wyona Almas RN,  March 08, 2010 3:11 PM

## 2010-06-20 NOTE — Assessment & Plan Note (Signed)
Summary: NP6/ CHESTPAIN/PT HAS MEDICARE./ [employer]. GD  Medications Added METFORMIN HCL 500 MG  TABS (METFORMIN HCL) 2 by mouth two times a day PRILOSEC 20 MG CPDR (OMEPRAZOLE) 1 by mouth daily * LIMBRIL 1 po as needed      Allergies Added:   Visit Type:  Initial Consult Primary Provider:  Nolon Rod. Paz MD  CC:  chest pain.  History of Present Illness: 70 yo WF with h/o DM, HTN, hyperlipidemia and breast cancer s/p XRT in 2009 to left breast who is here today as a new pt for evaluation of band-like chest pain that lasts for an hour. The last episode was two months ago and occurred at rest. This happened in [place]. This was followed by syncope for a few seconds. She was admitted but no stress testing was performed. She reports normal cardiac cath in 2002. I reviewed this report. It was done at [facility] by Dr. Diona Browner. Mild  chest pain over last few weeks. She is very inactive secondary to chronic neck pain. Her energy level is overall decreased.   Current Medications (verified): 1)  Metformin Hcl 500 Mg  Tabs (Metformin Hcl) .... 2 By Mouth Two Times A Day 2)  Glimepiride 4 Mg  Tabs (Glimepiride) .Marland Kitchen.. 1 By Mouth Two Times A Day 3)  Ultram   Tabs (Tramadol Hcl Tabs) 4)  Maxzide-25 37.5-25 Mg  Tabs (Triamterene-Hctz) .Marland Kitchen.. 1 By Mouth Once Daily 5)  Synthroid 125 Mcg Tabs (Levothyroxine Sodium) .Marland Kitchen.. 1 By Mouth Once Daily 6)  Ambien 10 Mg  Tabs (Zolpidem Tartrate) .Marland Kitchen.. 1 By Mouth At Bedtime 7)  Ascensia Contour Test  Strp (Glucose Blood) 8)  Arimidex 1 Mg  Tabs (Anastrozole) 9)  Alprazolam Xr 0.5 Mg  Tb24 (Alprazolam) .... 1/2 To 1 By Mouth Qid As Needed Anxiety or Tremor As Needed 10)  Mobic 7.5 Mg Tabs (Meloxicam) .Marland Kitchen.. 1 By Mouth Once Daily 11)  Sinemet Cr 25-100 Mg Cr-Tabs (Carbidopa-Levodopa) .Marland Kitchen.. 1 By Mouth At Bedtime As Needed Leg Cramps 12)  Flonase 50 Mcg/act Susp (Fluticasone Propionate) .... 2 Sprays On Each Side of The Nose 13)  Prilosec 20 Mg Cpdr (Omeprazole) .Marland Kitchen.. 1 By Mouth Daily 14)   Limbril .Marland Kitchen.. 1 Po As Needed  Allergies (verified): 1)  ! Iodine 2)  ! Pepcid 3)  ! * Serzone 4)  ! 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 Lumpectomy (2009) - left breast Appendectomy bilateral cataract surgery 2/11  Family History: Reviewed history from 09/07/2009 and no changes required. CAD - no HTN - M DM - no stroke - no colon Ca - no breast Ca - no M- deceased lung cancer  F - TB age 67  Social History: Reviewed history from 12/23/2008 and no changes required. Married lives in [place] half of the time 2 kids Never Smoked Alcohol use-no Regular exercise-no   Review of Systems       The patient complains of chest pain.  The patient denies fatigue, malaise, fever, weight gain/loss, vision loss, decreased hearing, hoarseness, palpitations, shortness of breath, prolonged cough, wheezing, sleep apnea, coughing up blood,  abdominal pain, blood in stool, nausea, vomiting, diarrhea, heartburn, incontinence, blood in urine, muscle weakness, joint pain, leg swelling, rash, skin lesions, headache, fainting, dizziness, depression, anxiety, enlarged lymph nodes, easy bruising or bleeding, and environmental allergies.    Vital Signs:  Patient profile:   70 year old female Height:      64.5 inches Weight:      170  pounds BMI:     28.83 Pulse rate:   82 / minute Resp:     16 per minute BP sitting:   143 / 80  (left arm)  Vitals Entered By: Marrion Coy, CNA (September 13, 2009 3:54 PM)  Physical Exam  General:  .General: Well developed, well nourished, NAD HEENT: OP clear, mucus membranes moist SKIN: warm, dry Neuro: No focal deficits Musculoskeletal: Muscle strength 5/5 all ext Psychiatric: Mood and affect normal Neck: No JVD, no carotid bruits, no thyromegaly, no lymphadenopathy. Lungs:Clear bilaterally, no wheezes, rhonci, crackles CV: RRR no murmurs, gallops rubs Abdomen: soft, NT, ND, BS present Extremities: No edema, pulses 2+.    EKG  Procedure date:  08/30/2009  Findings:      NSR, possible septal infarct. LAD  Impression & Recommendations:  Problem # 1:  CHEST PAIN (ICD-786.50) Her cardiac risk factors include DM, HTN, hyperlipidemia, prior radiation therapy to the chest. She is inactive secondary to neck pain. Given her risk factors, will order Lexiscan myoview. She is unable to walk on the treadmill because of neck pain so we cannot perform treadmill study. Will also check echo to rule out structural heart disease.   Orders: Echocardiogram (Echo) Nuclear Stress Test (Nuc Stress Test)  Patient Instructions: 1)  Your physician recommends that you schedule a follow-up appointment in: 3 - 4 weeks 2)  Your physician has requested that you have an echocardiogram.  Echocardiography is a painless test that uses sound waves to create images of your heart. It provides your doctor with information about the size and shape of your heart and how well your heart's chambers and valves are working.  This procedure takes approximately one hour. There are no restrictions for this procedure. 3)  Your physician has requested that you have an adenosine myoview.  For further information please visit [email or web address]  Please follow instruction sheet, as given.

## 2010-06-20 NOTE — Letter (Signed)
Summary: Not Accepting New Fibromyalgia Patients/[facility]  Not Accepting [facility]/ [facility]   Imported By: Lanelle Bal 02/02/2010 09:26:41  _____________________________________________________________________  External Attachment:    Type:   Image     Comment:   External Document

## 2010-06-20 NOTE — Consult Note (Signed)
Summary: [facility]   Imported By: Lanelle Bal 10/13/2009 11:49:46  _____________________________________________________________________  External Attachment:    Type:   Image     Comment:   External Document

## 2010-06-20 NOTE — Letter (Signed)
Summary: Diabetic Instructions  [facility]  [address] [place], [place] [address]   Phone: [phone]  Fax: [phone]    Sharon Armstrong [birth year] MRN: [id number]       ORAL DIABETIC MEDICATION INSTRUCTIONS  The day before your procedure:   Take your diabetic pill as you do normally  The day of your procedure:   Do not take your diabetic pill    We will check your blood sugar levels during the admission process and again in Recovery before discharging you home  ________________________________________________________________________     INSULIN (LONG ACTING) MEDICATION INSTRUCTIONS (Lantus, NPH, 70/30, Humulin, Novolin-N)   The day before your procedure:   Take  your regular evening dose    The day of your procedure:   Do not take your morning dose

## 2010-06-20 NOTE — Letter (Signed)
Summary: Pre Visit Letter Revised  [facility]  [address] [place], [place] [address]   Phone: [phone]  Fax: [phone]        01/19/2010 MRN: [id number] [facility] [place] [address] [place], [place]  [address]             Procedure Date:  03-15-10   Welcome to the Gastroenterology Division at [facility].    You are scheduled to see a nurse for your pre-procedure visit on 03-01-10 at 10:30a.m. on the 3rd floor at [facility], [address].  We ask that you try to arrive at our office 15 minutes prior to your appointment time to allow for check-in.  Please take a minute to review the attached form.  If you answer "Yes" to one or more of the questions on the first page, we ask that you call the person listed at your earliest opportunity.  If you answer "No" to all of the questions, please complete the rest of the form and bring it to your appointment.    Your nurse visit will consist of discussing your medical and surgical history, your immediate family medical history, and your medications.   If you are unable to list all of your medications on the form, please bring the medication bottles to your appointment and we will list them.  We will need to be aware of both prescribed and over the counter drugs.  We will need to know exact dosage information as well.    Please be prepared to read and sign documents such as consent forms, a financial agreement, and acknowledgement forms.  If necessary, and with your consent, a friend or relative is welcome to sit-in on the nurse visit with you.  Please bring your insurance card so that we may make a copy of it.  If your insurance requires a referral to see a specialist, please bring your referral form from your primary care physician.  No co-pay is required for this nurse visit.     If you cannot keep your appointment, please call [phone] to cancel or reschedule prior to your appointment date.  This  allows [place] the opportunity to schedule an appointment for another patient in need of care.    Thank you for choosing [facility] for your medical needs.  We appreciate the opportunity to care for you.  Please visit [place] at our website  to learn more about our practice.  Sincerely, The Gastroenterology Division

## 2010-07-13 ENCOUNTER — Telehealth: Payer: Self-pay | Admitting: Internal Medicine

## 2010-07-18 NOTE — Progress Notes (Signed)
Summary: Refill--Lantus  Phone Note Refill Request Message from:  Pharmacy on July 13, 2010 2:39 PM  Refills Requested: Medication #1:  LANTUS SOLOSTAR 100 UNIT/ML SOLN 25 units every night [facility] [phone].  RX was given to the out of state pharmacy via phone.  Initial call taken by: Lucious Groves CMA,  July 13, 2010 2:39 PM    Prescriptions: LANTUS SOLOSTAR 100 UNIT/ML SOLN (INSULIN GLARGINE) 25 units every night  #1 box x 1   Entered by:   Lucious Groves CMA   Authorized by:   Nolon Rod. Gaberial Cada MD   Signed by:   Lucious Groves CMA on 07/13/2010   Method used:   Telephoned to ...       [address] [phone]* (retail)       [address] [place], [place]  [address]       Ph: [phone]       Fax: [phone]   RxID:   [id number]

## 2010-08-02 LAB — GLUCOSE, CAPILLARY: Glucose-Capillary: 94 mg/dL (ref 70–99)

## 2010-08-10 ENCOUNTER — Other Ambulatory Visit: Payer: Self-pay | Admitting: Internal Medicine

## 2010-08-10 NOTE — Telephone Encounter (Signed)
Prior Authorization Required [id number] Cardholder ID # [id number]

## 2010-08-11 NOTE — Telephone Encounter (Signed)
This medication was not prescribed by Dr. Drue Novel. Spoke w/ pharmacy informed that this was sent to our office in error will send to Dr. Ethelene Hal who is prescribing physician.

## 2010-08-14 ENCOUNTER — Encounter: Payer: Self-pay | Admitting: Gastroenterology

## 2010-08-22 NOTE — Letter (Signed)
Summary: New Patient letter  [facility]  [address] [place], [place] [address]   Phone: [phone]  Fax: [phone]       08/14/2010 MRN: [id number]  [facility] [place] [address] [place]  [address]  Dear Sharon Armstrong,  Welcome to the Gastroenterology Division at [facility].    You are scheduled to see Dr.  Russella Dar on 09-21-2010 at 11:30am  on the 3rd floor at [facility], [address].  We ask that you try to arrive at our office 15 minutes prior to your appointment time to allow for check-in.  We would like you to complete the enclosed self-administered evaluation form prior to your visit and bring it with you on the day of your appointment.  We will review it with you.  Also, please bring a complete list of all your medications or, if you prefer, bring the medication bottles and we will list them.  Please bring your insurance card so that we may make a copy of it.  If your insurance requires a referral to see a specialist, please bring your referral form from your primary care physician.  Co-payments are due at the time of your visit and may be paid by cash, check or credit card.     Your office visit will consist of a consult with your physician (includes a physical exam), any laboratory testing he/she may order, scheduling of any necessary diagnostic testing (e.g. x-ray, ultrasound, CT-scan), and scheduling of a procedure (e.g. Endoscopy, Colonoscopy) if required.  Please allow enough time on your schedule to allow for any/all of these possibilities.    If you cannot keep your appointment, please call [phone] to cancel or reschedule prior to your appointment date.  This allows [place] the opportunity to schedule an appointment for another patient in need of care.  If you do not cancel or reschedule by 5 p.m. the business day prior to your appointment date, you will be charged a $50.00 late cancellation/no-show fee.    Thank you for choosing  [facility] for your medical needs.  We appreciate the opportunity to care for you.  Please visit [place] at our website  to learn more about our practice.                     Sincerely,                                                             The Gastroenterology Division

## 2010-08-30 ENCOUNTER — Encounter: Payer: Self-pay | Admitting: Family Medicine

## 2010-08-30 ENCOUNTER — Ambulatory Visit (INDEPENDENT_AMBULATORY_CARE_PROVIDER_SITE_OTHER): Payer: Medicare Other | Admitting: Family Medicine

## 2010-08-30 VITALS — BP 126/72 | Temp 98.7°F | Wt 156.6 lb

## 2010-08-30 DIAGNOSIS — N390 Urinary tract infection, site not specified: Secondary | ICD-10-CM

## 2010-08-30 LAB — POCT URINALYSIS DIPSTICK
Glucose, UA: NEGATIVE
Ketones, UA: NEGATIVE
Nitrite, UA: NEGATIVE
Spec Grav, UA: 1.01
pH, UA: 5

## 2010-08-30 MED ORDER — CIPROFLOXACIN HCL 500 MG PO TABS: 500.0000 mg | ORAL_TABLET | Freq: Two times a day (BID) | ORAL | Status: AC

## 2010-08-30 NOTE — Patient Instructions (Signed)
Urinary Tract Infection (UTI)   Infections of the urinary tract can start in several places. A bladder infection (cystitis), a kidney infection (pyelonephritis), and a prostate infection (prostatitis) are different types of urinary tract infections. They usually get better if treated with medicines (antibiotics) that kill germs. Take all the medicine until it is gone. You or your child may feel better in a few days, but TAKE ALL MEDICINE or the infection may not respond and may become more difficult to treat.   HOME CARE INSTRUCTIONS   Drink enough water and fluids to keep the urine clear or pale yellow. Cranberry juice is especially recommended, in addition to large amounts of water.   Avoid caffeine, tea, and carbonated beverages. They tend to irritate the bladder.   Alcohol may irritate the prostate.   Only take over-the-counter or prescription medicines for pain, discomfort, or fever as directed by your caregiver.   FINDING OUT THE RESULTS OF YOUR TEST   Not all test results are available during your visit. If your or your child's test results are not back during the visit, make an appointment with your caregiver to find out the results. Do not assume everything is normal if you have not heard from your caregiver or the medical facility. It is important for you to follow up on all test results.   TO PREVENT FURTHER INFECTIONS:   Empty the bladder often. Avoid holding urine for long periods of time.   After a bowel movement, women should cleanse from front to back. Use each tissue only once.   Empty the bladder before and after sexual intercourse.   SEEK MEDICAL CARE IF:   There is back pain.   You or your child has an oral temperature above 100.4.   Your baby is older than 3 months with a rectal temperature of 100.5º F (38.1° C) or higher for more than 1 day.   Your or your child's problems (symptoms) are no better in 3 days. Return sooner if you or your child is getting worse.   SEEK IMMEDIATE MEDICAL CARE IF:    There is severe back pain or lower abdominal pain.   You or your child develops chills.   You or your child has an oral temperature above 100.4, not controlled by medicine.   Your baby is older than 3 months with a rectal temperature of 102º F (38.9º C) or higher.   Your baby is 3 months old or younger with a rectal temperature of 100.4º F (38º C) or higher.   There is nausea or vomiting.   There is continued burning or discomfort with urination.   MAKE SURE YOU:   Understand these instructions.   Will watch this condition.   Will get help right away if you or your child is not doing well or gets worse.   Document Released: 02/14/2005 Document Re-Released: 08/01/2009   ExitCare® Patient Information ©2011 [employer].

## 2010-08-30 NOTE — Assessment & Plan Note (Signed)
cipro for 5 days  rto prn

## 2010-08-30 NOTE — Progress Notes (Signed)
  Subjective:    Sharon Armstrong is a 70 y.o. female who complains of burning with urination and frequency. She has had symptoms for 4 days. Patient also complains of none. Patient denies congestion, cough, fever, headache and rhinitis. Patient does not have a history of recurrent UTI. Patient does not have a history of pyelonephritis.   The following portions of the patient's history were reviewed and updated as appropriate: allergies, current medications, past family history, past medical history, past social history, past surgical history and problem list.  Review of Systems Pertinent items are noted in HPI.    Objective:    BP 126/72  Temp(Src) 98.7 F (37.1 C) (Oral)  Wt 156 lb 9.6 oz (71.033 kg)  Laboratory:  Urine dipstick: sp gravity 1.010, negative for glucose, moderate for hemoglobin, negative for ketones, large for leukocyte esterase, negative for nitrites, negative for protein and mod for urobilinogen Micro exam: n/a   Assessment:    Acute cystitis and UTI     Plan:    Medications: ciprofloxacin. Maintain adequate hydration. Follow up if symptoms not improving, and as needed.

## 2010-09-01 LAB — URINE CULTURE: Colony Count: >=100000

## 2010-09-08 ENCOUNTER — Telehealth: Payer: Self-pay | Admitting: *Deleted

## 2010-09-08 NOTE — Telephone Encounter (Signed)
If this does not feel like a yeast infx , needs to be seen, rec go to the [facility]

## 2010-09-08 NOTE — Telephone Encounter (Signed)
Pt seen for UTI on 08-30-10 Rx Cipro. Pt finished med 4 days ago. Pt c/o redness,tenderness and irration in vaginal area. Pt also note some blood with wiping. Pt denies any discharge or odor. Pt indicated that it does not feel like a yeast infection. [facility] Pt unable to come in due to dr Drue Novel being 1 hour behind and no opening. Please advise on what you would prefer Pt to do.

## 2010-09-08 NOTE — Telephone Encounter (Signed)
I spoke w/ pt she is aware.  

## 2010-09-14 ENCOUNTER — Ambulatory Visit (INDEPENDENT_AMBULATORY_CARE_PROVIDER_SITE_OTHER): Payer: Medicare Other | Admitting: Internal Medicine

## 2010-09-14 ENCOUNTER — Ambulatory Visit: Payer: PRIVATE HEALTH INSURANCE | Admitting: Internal Medicine

## 2010-09-14 ENCOUNTER — Encounter: Payer: Self-pay | Admitting: Internal Medicine

## 2010-09-14 VITALS — BP 126/84 | HR 80 | Temp 98.2°F | Wt 155.8 lb

## 2010-09-14 DIAGNOSIS — N390 Urinary tract infection, site not specified: Secondary | ICD-10-CM

## 2010-09-14 LAB — POCT URINALYSIS DIPSTICK
Bilirubin, UA: NEGATIVE
Glucose, UA: NEGATIVE
Ketones, UA: NEGATIVE
Spec Grav, UA: 1.03

## 2010-09-14 MED ORDER — CLOTRIMAZOLE-BETAMETHASONE 1-0.05 % EX CREA: TOPICAL_CREAM | CUTANEOUS | Status: DC

## 2010-09-14 MED ORDER — LEVOFLOXACIN 500 MG PO TABS: 500.0000 mg | ORAL_TABLET | Freq: Every day | ORAL | Status: AC

## 2010-09-14 NOTE — Patient Instructions (Signed)
meds as prescribed Lots of fluids Came back in 2 weeks for your routine office visit Call if symptoms severe or no better in 2 -3 days

## 2010-09-14 NOTE — Assessment & Plan Note (Signed)
Pt has a UTI that has not responded to appropiate abx Had a rash of unclear etiology (herpes? Fungal?)  Rash better , see physical exam Plan: reculture abx x 10 days Topical cream for rash

## 2010-09-14 NOTE — Progress Notes (Signed)
  Subjective:    Patient ID: Sharon Armstrong, female    DOB: May 31, 1940, 70 y.o.   MRN: [id number]  HPI  Was seen 4-11 w/ a UTI, Rx cipro;  UCX + for E coli sensitive to cipro. She initially improved but sx returned on-off 4 days later: Lower abd pressure, some dysuria. Also had vulvar irritation, apparently had redness and "bleeding from the skin", OTC vaginal cream has helped.  Past Medical History  Diagnosis Date  . Diabetes mellitus   . Hypertension   . Anxiety   . Aortic valve insufficiency     mild  . Hyperlipemia   . Hypothyroidism   . Elevated liver function tests     fatty liver per CT 2009  . Diverticulosis of colon   . Osteoarthritis   . Cervical stenosis of spine     sees neuro surgery and dr.ramons s/p local injection 4/09  . Breast cancer 04/2007    s/p XRT (last 4/09)  . Urticaria 06/2009  . 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 No  N-V-D No flank pain H/o previous renal stone per u/s  No vaginal d/c or bleed    Objective:   Physical Exam  Constitutional: She appears well-developed and well-nourished. No distress.  Abdominal: Soft. Bowel sounds are normal. She exhibits no distension and no mass. There is no rebound and no guarding.       Mild lower abd discomfort to palpation  Genitourinary:       Vulva w/ very mild redness in the labia, no lesions, no bleed, no vag d/c noted   Skin: She is not diaphoretic.          Assessment & Plan:

## 2010-09-17 LAB — CULTURE, URINE COMPREHENSIVE: Colony Count: 45000

## 2010-09-18 ENCOUNTER — Telehealth: Payer: Self-pay | Admitting: *Deleted

## 2010-09-18 NOTE — Telephone Encounter (Signed)
Message copied by Army Fossa on Mon Sep 18, 2010 10:48 AM ------      Message from: Willow Ora      Created: Mon Sep 18, 2010 10:46 AM       Advise patient.      Urine culture is again positive, plan is the same

## 2010-09-18 NOTE — Telephone Encounter (Signed)
Pt aware of urine culture.

## 2010-09-18 NOTE — Telephone Encounter (Signed)
Message left for patient to return my call.  

## 2010-09-21 ENCOUNTER — Ambulatory Visit (INDEPENDENT_AMBULATORY_CARE_PROVIDER_SITE_OTHER): Payer: Medicare Other | Admitting: Gastroenterology

## 2010-09-21 ENCOUNTER — Encounter: Payer: Self-pay | Admitting: Gastroenterology

## 2010-09-21 ENCOUNTER — Other Ambulatory Visit (INDEPENDENT_AMBULATORY_CARE_PROVIDER_SITE_OTHER): Payer: Medicare Other

## 2010-09-21 VITALS — BP 140/58 | HR 84 | Ht 64.0 in | Wt 153.0 lb

## 2010-09-21 DIAGNOSIS — R932 Abnormal findings on diagnostic imaging of liver and biliary tract: Secondary | ICD-10-CM

## 2010-09-21 DIAGNOSIS — R634 Abnormal weight loss: Secondary | ICD-10-CM

## 2010-09-21 DIAGNOSIS — Z86010 Personal history of colonic polyps: Secondary | ICD-10-CM

## 2010-09-21 DIAGNOSIS — R1013 Epigastric pain: Secondary | ICD-10-CM

## 2010-09-21 LAB — CREATININE, SERUM: Creatinine, Ser: 0.8 mg/dL (ref 0.4–1.2)

## 2010-09-21 NOTE — Progress Notes (Signed)
History of Present Illness: This is a 70 year old female here today with her husband. She history of weight loss over the past several months. She also notes intermittent, squeezing upper abdominal pain radiating around both costal margins to her back in a belt-like distribution. She states the symptoms occur suddenly, are quite severe and can last for several hours. They do not appear to be related to meals or any activity and they generally occur about once a month. She was evaluated by her physicians in [place], [place] with a CT scan of the chest abdomen and pelvis in November 2011. There was abnormal soft tissue prominence at the junction of the head and body of the pancreas. She was referred to a gastroenterologist, Isidoro Donning M.D. and she underwent endoscopic ultrasound on 06/22/2010 which showed a normal pancreas. She was recommended to undergo MRI and MRCP to complete the evaluation of the pancreas. Recently she has noted a 2 pound weight gain. In addition to the CT scan reports an endoscopic ultrasound report I reviewed blood work from 08/14/2010 shows a minimally elevated ALT at 40 and minimally elevated AST at 37. Glucose was elevated at 170 and CA 27-29 was normal. CBC was normal. She has had elevated liver function tests in the past and underwent evaluation in May 2007 with a negative workup except for fatty infiltration of the liver.  Current Medications, Allergies, Past Medical History, Past Surgical History, Family History and Social History were reviewed in [employer] record.  Physical Exam: General: Well developed , well nourished, no acute distress Head: Normocephalic and atraumatic Eyes:  sclerae anicteric, EOMI Ears: Normal auditory acuity Mouth: No deformity or lesions Lungs: Clear throughout to auscultation Heart: Regular rate and rhythm; no murmurs, rubs or bruits Abdomen: Soft, non tender and non distended. No masses, hepatosplenomegaly or hernias  noted. Normal Bowel sounds Musculoskeletal: Symmetrical with no gross deformities  Pulses:  Normal pulses noted Extremities: No clubbing, cyanosis, edema or deformities noted Neurological: Alert oriented x 4, grossly nonfocal Psychological:  Alert and cooperative. Mildly anxious  Assessment and Recommendations:  1. Abnormal CT scan of the pancreas. I suspect this is an insignificant anatomic abnormality or imaging artifact since no abnormality was noted on endoscopic ultrasound. Proceed with MRI of the pancreas and MRCP for further evaluation. If no worrisome abnormalities are noted a follow up imaging study, such as a CT of the pancreas in 6-12 months, would be reasonable and she may either do this in [place] or with her physicians in [place], [place].  2. Intermittent upper abdominal pain and costal margin pain radiating to the back. MRCP as above for further evaluation of possible biliary causes. Consider a CCK-HIDA if her symptoms persist. Symptoms might be musculoskeletal.  3. Mild unexplained weight loss. This problem has currently stabilized. Will monitor for now. No further gastrointestinal recommended at this time.  3. Abnormal transaminases. Suspect this is due to fatty infiltration of the liver. Monitor at regular intervals for her primary physician.  4. Personal history of adenomatous colon polyps. Surveillance colonoscopy recommended October 2014.   5. Personal history of breast cancer.

## 2010-09-21 NOTE — Patient Instructions (Signed)
You've been scheduled for a MRI/MRCP at [facility] and arrive at 11:45 am. Nothing to eat or drink 4 hours prior. Go to the basement today to have your labs drawn.

## 2010-09-22 ENCOUNTER — Encounter: Payer: Self-pay | Admitting: Gastroenterology

## 2010-09-25 ENCOUNTER — Ambulatory Visit (HOSPITAL_COMMUNITY)
Admission: RE | Admit: 2010-09-25 | Discharge: 2010-09-25 | Disposition: A | Discharge status: Home / Self Care | Payer: Medicare Other | Source: Ambulatory Visit | Attending: Gastroenterology | Admitting: Gastroenterology

## 2010-09-25 ENCOUNTER — Ambulatory Visit (HOSPITAL_COMMUNITY): Admission: RE | Admit: 2010-09-25 | Payer: Medicare Other | Source: Ambulatory Visit

## 2010-09-25 DIAGNOSIS — R932 Abnormal findings on diagnostic imaging of liver and biliary tract: Secondary | ICD-10-CM

## 2010-09-25 DIAGNOSIS — K869 Disease of pancreas, unspecified: Secondary | ICD-10-CM | POA: Insufficient documentation

## 2010-09-25 DIAGNOSIS — Q619 Cystic kidney disease, unspecified: Secondary | ICD-10-CM | POA: Insufficient documentation

## 2010-09-25 DIAGNOSIS — R599 Enlarged lymph nodes, unspecified: Secondary | ICD-10-CM | POA: Insufficient documentation

## 2010-09-25 MED ORDER — GADOBENATE DIMEGLUMINE 529 MG/ML IV SOLN: 15.0000 mL | Freq: Once | INTRAVENOUS | Status: DC

## 2010-09-27 ENCOUNTER — Telehealth: Payer: Self-pay | Admitting: Internal Medicine

## 2010-09-27 NOTE — Telephone Encounter (Signed)
Patient needs refill  tramadol - ambien - [facility] --- last refill was filled by md  In [place]

## 2010-09-27 NOTE — Telephone Encounter (Signed)
Please advise 

## 2010-09-28 MED ORDER — TRAMADOL HCL 50 MG PO TABS: 50.0000 mg | ORAL_TABLET | Freq: Four times a day (QID) | ORAL | Status: DC | PRN

## 2010-09-28 MED ORDER — ZOLPIDEM TARTRATE 10 MG PO TABS: 10.0000 mg | ORAL_TABLET | Freq: Every evening | ORAL | Status: DC | PRN

## 2010-09-28 NOTE — Telephone Encounter (Signed)
Ok 90, 1 RF 

## 2010-09-28 NOTE — Telephone Encounter (Signed)
Okay, ultram 50 mg 1 by mouth 4 times a day when necessary, #60, one refill

## 2010-09-28 NOTE — Telephone Encounter (Signed)
Pt also requested ambien.

## 2010-09-29 ENCOUNTER — Ambulatory Visit: Payer: PRIVATE HEALTH INSURANCE | Admitting: Internal Medicine

## 2010-10-04 ENCOUNTER — Other Ambulatory Visit: Payer: Self-pay | Admitting: Internal Medicine

## 2010-10-04 ENCOUNTER — Ambulatory Visit (INDEPENDENT_AMBULATORY_CARE_PROVIDER_SITE_OTHER): Payer: Medicare Other | Admitting: Internal Medicine

## 2010-10-04 VITALS — BP 124/80 | HR 81 | Wt 153.2 lb

## 2010-10-04 DIAGNOSIS — N390 Urinary tract infection, site not specified: Secondary | ICD-10-CM

## 2010-10-04 LAB — POCT URINALYSIS DIPSTICK
Bilirubin, UA: NEGATIVE
Ketones, UA: NEGATIVE
Spec Grav, UA: 1.02
pH, UA: 6.5

## 2010-10-04 NOTE — Progress Notes (Signed)
  Subjective:    Patient ID: Sharon Armstrong, female    DOB: Aug 25, 1940, 70 y.o.   MRN: [id number]  HPI The patient was seen twice in the last few weeks due to a UTI. Urine culture positive twice. Status post initially Cipro and then Levaquin. Here for followup.  Past Medical History  Diagnosis Date  . Diabetes mellitus   . Hypertension   . Anxiety   . Aortic valve insufficiency     mild  . Hyperlipemia   . Hypothyroidism   . Elevated liver function tests     fatty liver per CT 2009  . Diverticulosis of colon   . Osteoarthritis   . Cervical stenosis of spine     sees neuro surgery and dr.ramons s/p local injection 4/09  . Breast cancer 04/2007    s/p XRT (last 4/09)  . Urticaria 06/2009  . 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 Adenomatous polyps 12-16-1998  . Hemorrhoids    Past Surgical History  Procedure Date  . Breast lumpectomy 2009    left breast  . Appendectomy   . Cataract extraction, bilateral 2/11  . Sinus cyst removed      Review of Systems No fever No dysuria or gross hematuria. Took 9 out of the 10 prescribed Levaquin because it was causing upper abdominal pain. Upper abdominal pain resolved after she stopped Levaquin. No nausea, vomiting, diarrhea Blood sugarhas been very good, and a 90-95 in the morning. At last OV she  complained of a genital rash, from time to time she has some burning in the area but she is not sure if she has actual rash or not since she does not inspect the area frequently.    Objective:   Physical Exam Alert, oriented x3. No apparent distress. No CVA tenderness. Abdomen, not distended, soft, nontender.       Assessment & Plan:

## 2010-10-04 NOTE — Patient Instructions (Signed)
Drink plenty of fluids Call if the pressure continue beyond 1 week for a urology referal

## 2010-10-04 NOTE — Assessment & Plan Note (Addendum)
2 + UCX recently, s/p cipro and levaquin (9 out of 10 tablets), has residual discomfort. Udip-- blood On further questioniung she reports that from time to time she has required a Foley catheter do to a bladder outlet obstruction. Last episode was about 9 months ago, she has seen before a urologist in [place] but never a Emergency planning/management officer. Plan: UA and UCX Urology referal if UA confirms hematuria, if CX + or if sx persist

## 2010-10-05 LAB — URINALYSIS, ROUTINE W REFLEX MICROSCOPIC
Leukocytes, UA: NEGATIVE
Nitrite: NEGATIVE
Specific Gravity, Urine: 1.024 (ref 1.005–1.030)
Urobilinogen, UA: 0.2 mg/dL (ref 0.0–1.0)
pH: 6 (ref 5.0–8.0)

## 2010-10-05 LAB — URINALYSIS, MICROSCOPIC ONLY
Casts: NONE SEEN
Crystals: NONE SEEN

## 2010-10-06 NOTE — Discharge Summary (Signed)
[place]  Patient:    Sharon Armstrong, Sharon Armstrong                      MRN: [id number] Adm. Date:  [id number] Disc. Date: 12/17/00 Attending:  Junious Silk Dictator:   Abelino Derrick, P.A.C. [facility]                  Referring Physician Discharge [place]  DISCHARGE DIAGNOSES: 1. Chest pain, essentially normal coronaries by catheterization this    admission. 2. Hypertension. 3. History of spinal stenosis of the C spine. 4. History of lower extremity neuropathy. 5. Contrast allergy.  HOSPITAL COURSE:  Patient is a 70 year old female who is in the process of moving from [place] to [place].  She says she had a negative Cardiolite five years ago.  She has been having some substernal chest pain described as an ache for the past week or so.  Symptoms seem to be worse when using stairs. She was admitted through the emergency room December 15, 2000 and seen by Dr. Jens Som.  She was started on heparin and admitted to rule out MI.  Initial EKG showed sinus rhythm with septal Q-waves.  CK-MB and troponin were negative.  She was started on aspirin and beta blocker and protime pump inhibitor.  Catheterization was done December 16, 2000 which revealed no flow limiting coronary disease and normal LV function without wall motion abnormality.  She is stable for discharge December 17, 2000.  She did have some bleeding post catheterization but this is stable at discharge.  DISCHARGE MEDICATIONS: 1. Protonix 40 mg q.d. 2. Vioxx 25 mg q.d. 3. Synthroid 0.125 mg q.d. 4. Sinemet as taken at home. 5. Xanax as taken at home. 6. Hydrochlorothiazide 25 mg q.d.  LABORATORIES:  CK-MB, troponin were negative.  Urinalysis is unremarkable. White count 6.1, hemoglobin 13.2, hematocrit 37.9, platelets 202,000.  Lipid profile shows cholesterol 204, HDL 56, LDL 137.  TSH 0.95.  EKG shows sinus rhythm as noted with septal Q-waves.  Chest x-ray was not done.  DISPOSITION:  Sharon Armstrong  is discharged in stable condition and will follow-up with our primary care service September 3 at 10 a.m.  She will be checked by her family doctor at home in about a week. DD:  12/17/00 TD:  12/17/00 Job: [id number]

## 2010-10-06 NOTE — Cardiovascular Report (Signed)
[place]  Patient:    Sharon Armstrong, Sharon Armstrong                      MRN: [id number] Proc. Date: 12/16/00 Adm. Date:  [id number] Attending:  Junious Silk CC:         Madolyn Frieze. Jens Som, M.D. 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  PROCEDURES: 1. Left heart catheterization. 2. Coronary angiography. 3. Left ventriculography.  CARDIOLOGIST:  Simona Huh, M.D.  DESCRIPTION OF PROCEDURE:  After informed consent was obtained, the patient was taken to the cardiac catheterization lab.  She was then prepped and draped in the usual sterile fashion, and the area about the right femoral artery was anesthetized with 1% lidocaine.  A 6-French sheath was placed in the femoral artery via the modified Seldinger technique.  Selective coronary angiography was performed using JR4 and JL4 catheters.  The left ventriculogram was performed using an angled pigtail catheter.  The patient tolerated the procedure well without obvious complications.  HEMODYNAMICS: Left ventricle 152/14, aorta 152/72.  ANGIOGRAPHIC FINDINGS: 1. The left main is a short vessel without evidence of flow-limiting    coronary artery disease. 2. The left anterior descending is a large vessel without evidence of    flow-limiting coronary artery disease. 3. The left circumflex is a large vessel which supplies a posterolateral    branch and is without flow-limiting coronary artery disease.  The coronary    system is left dominant. 4. The right coronary artery is a medium sized, nondominant vessel    without evidence of flow-limiting coronary artery disease.  LEFT VENTRICULOGRAM:  The left ventriculogram reveals an estimated ejection fraction of  60% with no focal wall motion abnormalities or mitral regurgitation.  CONCLUSIONS:  No evidence of flow-limiting coronary artery disease with normal left ventricular contraction.  The estimated ejection fraction is 60%, and there is no evidence of mitral regurgitation.  RECOMMENDATIONS:  Would suggest continued therapy for gastroesophageal reflux disease and perhaps consider pulmonary function testing if dyspnea continues.  The patient is in the process of relocating from [place] to [place] and will be set up with an internal medicine physician for further evaluation. DD:  12/16/00 TD:  12/17/00 Job: [id number]

## 2010-10-07 LAB — CULTURE, URINE COMPREHENSIVE: Colony Count: 50000

## 2010-10-09 ENCOUNTER — Telehealth: Payer: Self-pay

## 2010-10-09 DIAGNOSIS — N390 Urinary tract infection, site not specified: Secondary | ICD-10-CM

## 2010-10-09 MED ORDER — SULFAMETHOXAZOLE-TMP DS 800-160 MG PO TABS: 1.0000 | ORAL_TABLET | Freq: Two times a day (BID) | ORAL | Status: AC

## 2010-10-09 NOTE — Telephone Encounter (Signed)
Pt aware of results medication sent to pharmacy and referral placed.

## 2010-10-09 NOTE — Telephone Encounter (Signed)
Message copied by Doristine Devoid on Mon Oct 09, 2010  8:21 AM ------      Message from: Willow Ora      Created: Sun Oct 08, 2010 10:57 AM       Advise patient.      She still has a urinary tract infection.      Bactrim DS one by mouth twice a day for 14 days.      Refer to urology, dx persistent UTI. Send the last 3 OV notes and all the labs from the last 6 months

## 2010-10-27 ENCOUNTER — Telehealth: Payer: Self-pay | Admitting: *Deleted

## 2010-10-27 NOTE — Telephone Encounter (Signed)
Notified pt per Dr Russella Dar, MR is negative, no further GI evaluation at this time. Pt stated understanding.

## 2010-10-27 NOTE — Telephone Encounter (Signed)
Message copied by Florene Glen on Fri Oct 27, 2010  9:07 AM ------      Message from: Claudette Head T      Created: Thu Oct 26, 2010  6:53 PM       MR is negative      No further GI evaluation at this time

## 2010-10-27 NOTE — Telephone Encounter (Signed)
Message copied by Florene Glen on Fri Oct 27, 2010  9:13 AM ------      Message from: Claudette Head T      Created: Thu Oct 26, 2010  6:53 PM       MR is negative      No further GI evaluation at this time

## 2010-11-14 ENCOUNTER — Other Ambulatory Visit: Payer: Self-pay | Admitting: Internal Medicine

## 2010-11-14 MED ORDER — MELOXICAM 7.5 MG PO TABS: 7.5000 mg | ORAL_TABLET | Freq: Every day | ORAL | Status: DC | PRN

## 2010-11-14 NOTE — Telephone Encounter (Signed)
90, 1 RF 

## 2010-11-14 NOTE — Telephone Encounter (Signed)
Patient takes mobic as needed - needs 90 day supply [facility] [place] - rx expired

## 2010-11-14 NOTE — Telephone Encounter (Signed)
Please advise 

## 2010-11-15 ENCOUNTER — Other Ambulatory Visit: Payer: Self-pay | Admitting: Internal Medicine

## 2010-11-15 MED ORDER — INSULIN GLARGINE 100 UNIT/ML ~~LOC~~ SOLN: 25.0000 [IU] | Freq: Every day | SUBCUTANEOUS | Status: DC

## 2010-11-15 NOTE — Telephone Encounter (Signed)
meds sent in

## 2010-11-29 ENCOUNTER — Ambulatory Visit: Payer: Medicare Other | Admitting: Internal Medicine

## 2010-12-13 ENCOUNTER — Other Ambulatory Visit: Payer: Self-pay | Admitting: Internal Medicine

## 2010-12-13 NOTE — Telephone Encounter (Signed)
Faxed.   KP 

## 2010-12-13 NOTE — Telephone Encounter (Signed)
Ok 60, 3 RF 

## 2011-01-04 ENCOUNTER — Other Ambulatory Visit: Payer: Self-pay | Admitting: Internal Medicine

## 2011-01-08 ENCOUNTER — Telehealth: Payer: Self-pay | Admitting: Internal Medicine

## 2011-01-08 NOTE — Telephone Encounter (Signed)
Denied ,  needs a routine office visit (not a CPX)

## 2011-01-08 NOTE — Telephone Encounter (Signed)
Tramadol request [last refill 12/13/2010 #60x3] DENY-Too soon to refill; also, Pt states she is taking [2] tablets QID--Rx written for [1] tablet QID Please advise.

## 2011-01-08 NOTE — Telephone Encounter (Signed)
Patient needs refill tramodol - she takes 2 pills 4 times a day - [facility]

## 2011-01-09 NOTE — Telephone Encounter (Signed)
Patient called to check status of prescription because she is out of meds--please call in prescription as soon as possible---thanks         Verified that pharmacy is correct

## 2011-01-09 NOTE — Telephone Encounter (Signed)
Previous notes states that medication denied-no refills until office visit; scheduled on 01/24/2011; patient note also states that patient has been taking [2] tablets QID but is only prescribed [1] tablet QID  Spoke w/ patient who was extremely unresponsive to the fact that I could not promise her a response from Dr Drue Novel today concerning a refill on her Tramadol [refused due to being requested too early 12/13/10 last refill #60x3] Previous note also stated Pt taking [2] tablets QID, but medication only prescribed for [1] tablet QID. Patient states that she has always taken [2] every 6 hours.?  Dr Drue Novel requiring OV before refills. Pt has appt. Scheduled for 01/24/2011. Patient states she cannot go without medication until that time. Offered patient appointment tomorrow at 2:45pm, patient first replied that she "has a hair appt tomorrow afternoon". Explained that if patient took the appt tomorrow that Dr Drue Novel 'might be' more responsive to her request today, but again that I could not guarantee when he would respond.  Patient continued to ask for [10 minutes] 'guarantees as to when & what MD response would be; did we not care that she was suffering, what would be put in the message to the MD, asking for promise that she would get a response by 5:00pm today, could she be charged for an OV if I could get her an answer before 5pm, what if she drove over to the office before 5pm'.?  Patient called back & spoke w/Nikki-tomorrow appt canceled.

## 2011-01-09 NOTE — Telephone Encounter (Signed)
OV scheduled 01/24/11

## 2011-01-10 ENCOUNTER — Ambulatory Visit: Payer: Medicare Other | Admitting: Internal Medicine

## 2011-01-11 NOTE — Telephone Encounter (Signed)
I reviewed the chart, the last time I saw her for a routine visit was 01-2010. According to [employer] I was not in charge of prescribe tramadol (actually med list do not reflect dosage) . per Epic I prescribed 60 and 3 RF  (1 po qid since is the standard dose ) at pt request . In summary, she does need to a IV for a routine check, has been seen for acute issues only  this year. [facility]

## 2011-01-24 ENCOUNTER — Ambulatory Visit (INDEPENDENT_AMBULATORY_CARE_PROVIDER_SITE_OTHER): Payer: Medicare Other | Admitting: Internal Medicine

## 2011-01-24 ENCOUNTER — Encounter: Payer: Self-pay | Admitting: Internal Medicine

## 2011-01-24 DIAGNOSIS — I10 Essential (primary) hypertension: Secondary | ICD-10-CM

## 2011-01-24 DIAGNOSIS — G2581 Restless legs syndrome: Secondary | ICD-10-CM

## 2011-01-24 DIAGNOSIS — N390 Urinary tract infection, site not specified: Secondary | ICD-10-CM

## 2011-01-24 DIAGNOSIS — E119 Type 2 diabetes mellitus without complications: Secondary | ICD-10-CM

## 2011-01-24 DIAGNOSIS — C50919 Malignant neoplasm of unspecified site of unspecified female breast: Secondary | ICD-10-CM

## 2011-01-24 DIAGNOSIS — E785 Hyperlipidemia, unspecified: Secondary | ICD-10-CM

## 2011-01-24 DIAGNOSIS — E039 Hypothyroidism, unspecified: Secondary | ICD-10-CM

## 2011-01-24 LAB — COMPREHENSIVE METABOLIC PANEL
ALT: 43 U/L — ABNORMAL HIGH (ref 0–35)
AST: 47 U/L — ABNORMAL HIGH (ref 0–37)
Albumin: 4.1 g/dL (ref 3.5–5.2)
BUN: 16 mg/dL (ref 6–23)
CO2: 31 mEq/L (ref 19–32)
Calcium: 9.7 mg/dL (ref 8.4–10.5)
Chloride: 103 mEq/L (ref 96–112)
Creatinine, Ser: 0.7 mg/dL (ref 0.4–1.2)
GFR: 85.17 mL/min (ref >60.00)
Potassium: 4.3 mEq/L (ref 3.5–5.1)

## 2011-01-24 LAB — CBC WITH DIFFERENTIAL/PLATELET
Basophils Absolute: 0 10*3/uL (ref 0.0–0.1)
Eosinophils Absolute: 0 10*3/uL (ref 0.0–0.7)
Lymphocytes Relative: 39 % (ref 12.0–46.0)
MCHC: 33.6 g/dL (ref 30.0–36.0)
Monocytes Relative: 8.5 % (ref 3.0–12.0)
Neutrophils Relative %: 52.1 % (ref 43.0–77.0)
RDW: 14.2 % (ref 11.5–14.6)

## 2011-01-24 LAB — LIPID PANEL
HDL: 51.1 mg/dL (ref >39.00)
Total CHOL/HDL Ratio: 3
Triglycerides: 115 mg/dL (ref 0.0–149.0)

## 2011-01-24 LAB — MICROALBUMIN / CREATININE URINE RATIO
Creatinine,U: 88.3 mg/dL
Microalb Creat Ratio: 0.2 mg/g (ref 0.0–30.0)
Microalb, Ur: 0.2 mg/dL (ref 0.0–1.9)

## 2011-01-24 LAB — TSH: TSH: 0.38 u[IU]/mL (ref 0.35–5.50)

## 2011-01-24 MED ORDER — CARBIDOPA-LEVODOPA 25-100 MG PO TABS: 1.0000 | ORAL_TABLET | Freq: Every evening | ORAL | Status: DC | PRN

## 2011-01-24 NOTE — Assessment & Plan Note (Signed)
Due for labs

## 2011-01-24 NOTE — Assessment & Plan Note (Signed)
Used to be on Sinemet with great results. Restart Sinemet

## 2011-01-24 NOTE — Assessment & Plan Note (Addendum)
Good medication compliance, labs Feet exam today shows mild peripheral neuropathy, feet care discussed

## 2011-01-24 NOTE — Assessment & Plan Note (Addendum)
Recurrent-difficult to eradicate UTIs, currently being evaluated by urology, see history of present illness

## 2011-01-24 NOTE — Assessment & Plan Note (Signed)
Reports today that she sees a Careers adviser in [place] routinely

## 2011-01-24 NOTE — Assessment & Plan Note (Signed)
Well controlled 

## 2011-01-24 NOTE — Assessment & Plan Note (Addendum)
On no medicines at present, chart reviewed,  she used to be on WelChol she stopped taking it, no apparent side effects. Labs , restart welchol ?

## 2011-01-24 NOTE — Progress Notes (Signed)
  Subjective:    Patient ID: Sharon Armstrong, female    DOB: 07-14-1940, 70 y.o.   MRN: [id number]  HPI ROV DM-- cbgs around 110 Pain management-- on chronic meds , to have local injections w/  Dr Ethelene Hal soon Recurrent UTIs-- saw a local urologist Malen Gauze) to have urodinamics soon Hypothyroidism--  good medication compliance HTN-- normal amb BPs.   good medication compliance RLS-- sinemet used to help, RF ? (sx description fit classic RLS)    Past Medical History  Diagnosis Date  . Diabetes mellitus   . Hypertension   . Anxiety   . Aortic valve insufficiency     mild  . Hyperlipemia   . Hypothyroidism   . Elevated liver function tests     fatty liver per CT 2009  . Diverticulosis of colon   . Osteoarthritis   . Cervical stenosis of spine     sees neuro surgery and dr.ramons s/p local injection 4/09  . Breast cancer 04/2007    s/p XRT (last 4/09)  . Urticaria 06/2009  . 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 Adenomatous polyps 12/09/98  . Hemorrhoids    Past Surgical History  Procedure Date  . Breast lumpectomy 2009    left breast  . Appendectomy   . Cataract extraction, bilateral 2/11  . Sinus cyst removed      Review of Systems  Respiratory: Negative for cough and shortness of breath.   Cardiovascular: Negative for chest pain and leg swelling.  Psychiatric/Behavioral:       Some anxiety , on no meds        Objective:   Physical Exam  Constitutional: She appears well-developed and well-nourished. No distress.  HENT:  Head: Normocephalic and atraumatic.  Cardiovascular: Normal rate, regular rhythm and normal heart sounds.   No murmur heard. Pulmonary/Chest: Effort normal and breath sounds normal. No respiratory distress. She has no wheezes. She has no rales.  Musculoskeletal:       DIABETIC FEET EXAM: No lower extremity edema Normal pedal pulses bilaterally Skin and nails are normal without calluses Pinprick examination of the feet w/ patchy  decreased sensation at toes    Skin: She is not diaphoretic.          Assessment & Plan:

## 2011-01-25 DIAGNOSIS — Z0000 Encounter for general adult medical examination without abnormal findings: Secondary | ICD-10-CM | POA: Insufficient documentation

## 2011-01-25 NOTE — Assessment & Plan Note (Signed)
States she does her physical exams in [place] with her other physician

## 2011-01-29 ENCOUNTER — Encounter: Payer: Self-pay | Admitting: Internal Medicine

## 2011-03-15 ENCOUNTER — Other Ambulatory Visit: Payer: Self-pay | Admitting: Internal Medicine

## 2011-03-15 MED ORDER — ZOLPIDEM TARTRATE 10 MG PO TABS: 10.0000 mg | ORAL_TABLET | Freq: Every evening | ORAL | Status: DC | PRN

## 2011-03-15 NOTE — Telephone Encounter (Signed)
Ok 90 and 1 RF 

## 2011-03-15 NOTE — Telephone Encounter (Signed)
Zolpidem request [last refill 09/28/10 #90x1]

## 2011-03-15 NOTE — Telephone Encounter (Signed)
Done

## 2011-05-30 DIAGNOSIS — M4712 Other spondylosis with myelopathy, cervical region: Secondary | ICD-10-CM | POA: Diagnosis not present

## 2011-05-30 DIAGNOSIS — M62838 Other muscle spasm: Secondary | ICD-10-CM | POA: Diagnosis not present

## 2011-05-30 DIAGNOSIS — Z853 Personal history of malignant neoplasm of breast: Secondary | ICD-10-CM | POA: Diagnosis not present

## 2011-05-30 DIAGNOSIS — R252 Cramp and spasm: Secondary | ICD-10-CM | POA: Diagnosis not present

## 2011-06-06 DIAGNOSIS — R300 Dysuria: Secondary | ICD-10-CM | POA: Diagnosis not present

## 2011-06-06 DIAGNOSIS — N390 Urinary tract infection, site not specified: Secondary | ICD-10-CM | POA: Diagnosis not present

## 2011-06-07 DIAGNOSIS — N390 Urinary tract infection, site not specified: Secondary | ICD-10-CM | POA: Diagnosis not present

## 2011-07-04 DIAGNOSIS — H43399 Other vitreous opacities, unspecified eye: Secondary | ICD-10-CM | POA: Diagnosis not present

## 2011-07-04 DIAGNOSIS — H35379 Puckering of macula, unspecified eye: Secondary | ICD-10-CM | POA: Diagnosis not present

## 2011-07-04 DIAGNOSIS — H40019 Open angle with borderline findings, low risk, unspecified eye: Secondary | ICD-10-CM | POA: Diagnosis not present

## 2011-07-04 DIAGNOSIS — H26499 Other secondary cataract, unspecified eye: Secondary | ICD-10-CM | POA: Diagnosis not present

## 2011-07-09 DIAGNOSIS — M545 Low back pain, unspecified: Secondary | ICD-10-CM | POA: Diagnosis not present

## 2011-07-10 DIAGNOSIS — M47817 Spondylosis without myelopathy or radiculopathy, lumbosacral region: Secondary | ICD-10-CM | POA: Diagnosis not present

## 2011-07-16 DIAGNOSIS — M545 Low back pain, unspecified: Secondary | ICD-10-CM | POA: Diagnosis not present

## 2011-07-16 DIAGNOSIS — IMO0002 Reserved for concepts with insufficient information to code with codable children: Secondary | ICD-10-CM | POA: Diagnosis not present

## 2011-07-23 DIAGNOSIS — L509 Urticaria, unspecified: Secondary | ICD-10-CM | POA: Diagnosis not present

## 2011-07-27 DIAGNOSIS — Z961 Presence of intraocular lens: Secondary | ICD-10-CM | POA: Diagnosis not present

## 2011-07-27 DIAGNOSIS — H35039 Hypertensive retinopathy, unspecified eye: Secondary | ICD-10-CM | POA: Diagnosis not present

## 2011-07-30 DIAGNOSIS — E7800 Pure hypercholesterolemia, unspecified: Secondary | ICD-10-CM | POA: Diagnosis not present

## 2011-07-30 DIAGNOSIS — I10 Essential (primary) hypertension: Secondary | ICD-10-CM | POA: Diagnosis not present

## 2011-07-30 DIAGNOSIS — E119 Type 2 diabetes mellitus without complications: Secondary | ICD-10-CM | POA: Diagnosis not present

## 2011-07-30 DIAGNOSIS — M316 Other giant cell arteritis: Secondary | ICD-10-CM | POA: Diagnosis not present

## 2011-08-01 DIAGNOSIS — G4700 Insomnia, unspecified: Secondary | ICD-10-CM | POA: Diagnosis not present

## 2011-08-01 DIAGNOSIS — S139XXA Sprain of joints and ligaments of unspecified parts of neck, initial encounter: Secondary | ICD-10-CM | POA: Diagnosis not present

## 2011-08-01 DIAGNOSIS — E119 Type 2 diabetes mellitus without complications: Secondary | ICD-10-CM | POA: Diagnosis not present

## 2011-08-01 DIAGNOSIS — L509 Urticaria, unspecified: Secondary | ICD-10-CM | POA: Diagnosis not present

## 2011-08-09 DIAGNOSIS — Z961 Presence of intraocular lens: Secondary | ICD-10-CM | POA: Diagnosis not present

## 2011-08-09 DIAGNOSIS — H35039 Hypertensive retinopathy, unspecified eye: Secondary | ICD-10-CM | POA: Diagnosis not present

## 2011-08-09 DIAGNOSIS — H35379 Puckering of macula, unspecified eye: Secondary | ICD-10-CM | POA: Diagnosis not present

## 2011-09-14 ENCOUNTER — Other Ambulatory Visit: Payer: Self-pay | Admitting: Internal Medicine

## 2011-09-14 MED ORDER — ZOLPIDEM TARTRATE 10 MG PO TABS: 10.0000 mg | ORAL_TABLET | Freq: Every evening | ORAL | Status: DC | PRN

## 2011-09-14 NOTE — Telephone Encounter (Signed)
Refill for Ambien   Last written here 10.25.12 as Zolpidem Tartrate (Tab) AMBIEN 10 MG  Take 1 tablet (10 mg total) by mouth at bedtime as needed Qty 90  Last OV 9.05.2012

## 2011-09-14 NOTE — Telephone Encounter (Signed)
Refill done.  

## 2011-09-14 NOTE — Telephone Encounter (Signed)
Ok to refill 

## 2011-09-14 NOTE — Telephone Encounter (Signed)
30, no RF Needs OV

## 2011-11-08 ENCOUNTER — Telehealth: Payer: Self-pay | Admitting: Internal Medicine

## 2011-11-08 NOTE — Telephone Encounter (Signed)
Please advise 

## 2011-11-08 NOTE — Telephone Encounter (Signed)
wants prolia injection last one done in [place] in Dec, needs one here, please review and advise call patient back at [phone]

## 2011-11-09 NOTE — Telephone Encounter (Signed)
Left msg to return phone call.

## 2011-11-09 NOTE — Telephone Encounter (Signed)
Sharon Armstrong, Please arrange prolia injection x 1  Sharon Armstrong , Dr Laury Axon nurse knows the process)

## 2011-11-12 NOTE — Telephone Encounter (Signed)
Spoke with pt, re-submitted prolia form for insurance. Waiting to hear back.

## 2011-11-15 ENCOUNTER — Other Ambulatory Visit: Payer: Self-pay | Admitting: Internal Medicine

## 2011-11-15 MED ORDER — GLUCOSE BLOOD VI STRP: ORAL_STRIP | Status: DC

## 2011-11-15 MED ORDER — ONETOUCH DELICA LANCETS MISC: Status: DC

## 2011-11-15 NOTE — Addendum Note (Signed)
Addended by: Edwena Felty T on: 11/15/2011 03:29 PM   Modules accepted: Orders

## 2011-11-15 NOTE — Telephone Encounter (Signed)
Resent rx

## 2011-11-15 NOTE — Telephone Encounter (Signed)
Refill done.  

## 2011-11-15 NOTE — Telephone Encounter (Signed)
refill Glucose Blood (Strip) glucose blood   1 each by Other route as needed. Use as instructed   Send to [id number] - [place], [place] - [address]

## 2011-11-17 DIAGNOSIS — M5030 Other cervical disc degeneration, unspecified cervical region: Secondary | ICD-10-CM | POA: Diagnosis not present

## 2011-11-26 ENCOUNTER — Telehealth: Payer: Self-pay | Admitting: Internal Medicine

## 2011-11-26 NOTE — Telephone Encounter (Signed)
Pt would like to know when she can get her prolia shot.  Cell [phone]

## 2011-11-26 NOTE — Telephone Encounter (Signed)
Discussed with pt. Ive received a fax from [facility] & they are currently working on the request. Told the pt I will contact her when i hear back.

## 2011-12-03 NOTE — Telephone Encounter (Signed)
Patient called today and stated she has spoken to Medicare and they have not received anything from [place] [facility]. Patient is getting very upset about the wait, please call when your return and update patient on status

## 2011-12-05 NOTE — Telephone Encounter (Signed)
Called [facility] & they confirmed that the pt's prolia has been covered by insurance. Spoke with pt & scheduled her for 7.22.13.  Pt also stated that she forgot to take her insulin one day this week & pt wanted to know for future reference when this happens what should she do? Please advise.

## 2011-12-05 NOTE — Telephone Encounter (Signed)
Discussed with pt

## 2011-12-05 NOTE — Telephone Encounter (Signed)
If she forgets Lantus, go ahead and get the shot if she remembers within 12 hours otherwise just   skip the dose.

## 2011-12-10 ENCOUNTER — Ambulatory Visit (INDEPENDENT_AMBULATORY_CARE_PROVIDER_SITE_OTHER): Payer: Medicare Other | Admitting: *Deleted

## 2011-12-10 DIAGNOSIS — M810 Age-related osteoporosis without current pathological fracture: Secondary | ICD-10-CM | POA: Diagnosis not present

## 2011-12-10 DIAGNOSIS — M899 Disorder of bone, unspecified: Secondary | ICD-10-CM

## 2011-12-10 DIAGNOSIS — M8580 Other specified disorders of bone density and structure, unspecified site: Secondary | ICD-10-CM

## 2011-12-10 MED ORDER — DENOSUMAB 60 MG/ML ~~LOC~~ SOLN
60.0000 mg | Freq: Once | SUBCUTANEOUS | Status: DC
  Administered 2011-12-10: 60 mg via SUBCUTANEOUS

## 2011-12-10 MED ORDER — DENOSUMAB 60 MG/ML ~~LOC~~ SOLN
60.0000 mg | Freq: Once | SUBCUTANEOUS | Status: AC
  Administered 2017-12-27: 60 mg via SUBCUTANEOUS

## 2011-12-17 ENCOUNTER — Ambulatory Visit: Payer: Medicare Other | Admitting: Internal Medicine

## 2011-12-20 ENCOUNTER — Encounter: Payer: Self-pay | Admitting: Internal Medicine

## 2011-12-20 ENCOUNTER — Ambulatory Visit (INDEPENDENT_AMBULATORY_CARE_PROVIDER_SITE_OTHER): Payer: Medicare Other | Admitting: Internal Medicine

## 2011-12-20 VITALS — BP 118/78 | HR 73 | Temp 98.2°F | Wt 159.0 lb

## 2011-12-20 DIAGNOSIS — E119 Type 2 diabetes mellitus without complications: Secondary | ICD-10-CM

## 2011-12-20 DIAGNOSIS — G2581 Restless legs syndrome: Secondary | ICD-10-CM

## 2011-12-20 DIAGNOSIS — R945 Abnormal results of liver function studies: Secondary | ICD-10-CM

## 2011-12-20 DIAGNOSIS — E039 Hypothyroidism, unspecified: Secondary | ICD-10-CM | POA: Diagnosis not present

## 2011-12-20 DIAGNOSIS — G250 Essential tremor: Secondary | ICD-10-CM

## 2011-12-20 DIAGNOSIS — M810 Age-related osteoporosis without current pathological fracture: Secondary | ICD-10-CM

## 2011-12-20 DIAGNOSIS — E785 Hyperlipidemia, unspecified: Secondary | ICD-10-CM | POA: Diagnosis not present

## 2011-12-20 DIAGNOSIS — I10 Essential (primary) hypertension: Secondary | ICD-10-CM

## 2011-12-20 DIAGNOSIS — G252 Other specified forms of tremor: Secondary | ICD-10-CM | POA: Diagnosis not present

## 2011-12-20 DIAGNOSIS — M1990 Unspecified osteoarthritis, unspecified site: Secondary | ICD-10-CM

## 2011-12-20 LAB — BASIC METABOLIC PANEL
BUN: 15 mg/dL (ref 6–23)
Calcium: 9.4 mg/dL (ref 8.4–10.5)
Creatinine, Ser: 0.7 mg/dL (ref 0.4–1.2)
GFR: 95.6 mL/min (ref >60.00)

## 2011-12-20 LAB — HEMOGLOBIN A1C: Hgb A1c MFr Bld: 6.7 % — ABNORMAL HIGH (ref 4.6–6.5)

## 2011-12-20 NOTE — Assessment & Plan Note (Signed)
Reports good compliance with medication, will check a TSH

## 2011-12-20 NOTE — Progress Notes (Signed)
  Subjective:    Patient ID: Sharon Armstrong, female    DOB: 10/31/1940, 71 y.o.   MRN: [id number]  HPI Here to discuss the following issues Diabetes, likes an  A1c done. Last A1c was done in [place], 7.2. Insomnia, she is trying to wean herself off ambien, currently taking 1/3 of Ambien. Thyroid disease, good compliance with medication, needs a TSH. Osteoporosis, see assessment and plan. Hypertension, good medication compliance, not ambulatory BPs but reports that every time she goes to a doctor, her BPs normal.  Past Medical History:  Diabetes mellitus, type II HTN Anxiety Mild aortic valve insufficiency Hyperlipidemia hypothyroidism  elevated LFTS , Fatty liver per CT 2009 Diverticulosis, colon OA cervical spine stenosis-- sees neuro surgery and Dr Ethelene Hal, s/p local injection 4-09 Breast cancer, hx of (DX 12- 2008) - S/P XRT (last 4-09) Urticaria onset 2/ 2011 Positive PPD, hx of, never treated (father died TB)  Past Surgical History: Lumpectomy (2009) - left breast Appendectomy bilateral cataract surgery 2/11 Colon polyps Sinus cyst removed  Social History: Married, lives in [place] half of the time, 2 kids Never Smoked Alcohol use-no Regular exercise-no   Review of Systems No chest pain or shortness of breath Note nausea, vomiting, diarrhea.     Objective:   Physical Exam General -- alert, well-developed, and well-nourished.   Lungs -- normal respiratory effort, no intercostal retractions, no accessory muscle use, and normal breath sounds.   Heart-- normal rate, regular rhythm, no murmur, and no gallop.   DIABETIC FEET EXAM: No lower extremity edema Normal pedal pulses bilaterally Skin and nails are normal without calluses Pinprick examination of the feet-- few patches of decrease sensitivity. Neurologic-- alert & oriented X3 and strength normal in all extremities. Psych-- Cognition and judgment appear intact. Alert and cooperative with normal attention span  and concentration.  not anxious appearing and not depressed appearing.       Assessment & Plan:

## 2011-12-20 NOTE — Assessment & Plan Note (Addendum)
Good compliance with meds, last A1c was on in [place], 6 months ago  was 7.2 according to the patient. She reports she sees the eye doctor regularly. Plan: Labs Has some neuropathy on today's exam, feet care  discuss.

## 2011-12-20 NOTE — Patient Instructions (Signed)
Next prolia  injection is 05-2012

## 2011-12-20 NOTE — Assessment & Plan Note (Signed)
Per Dr. Sharen Hones in [place]

## 2011-12-20 NOTE — Assessment & Plan Note (Signed)
Management per Dr.Ramos. Unfortunately due to to osteoporosis she is unable to get local injections, currently on tramadol.

## 2011-12-20 NOTE — Assessment & Plan Note (Signed)
chronic issue, the patient reports extensive workup before.

## 2011-12-20 NOTE — Assessment & Plan Note (Signed)
Last bone density test 04/2011 at the orthopedic surgical office. Based on those results prolia  was recommended, second injection was 11-2011 in this office. Next shot 05-2012

## 2011-12-20 NOTE — Assessment & Plan Note (Signed)
Apparently not taking Sinemet anymore

## 2011-12-20 NOTE — Assessment & Plan Note (Signed)
She used to be on Sinemet but apparently she's not taking it anymore

## 2011-12-20 NOTE — Assessment & Plan Note (Signed)
Well controlled 

## 2011-12-24 ENCOUNTER — Encounter: Payer: Self-pay | Admitting: *Deleted

## 2011-12-31 ENCOUNTER — Telehealth: Payer: Self-pay | Admitting: Internal Medicine

## 2011-12-31 NOTE — Telephone Encounter (Signed)
Caller: Roger/Spouse; Patient Name: Sharon Armstrong; PCP: Willow Ora; Best Callback Phone Number: [phone] 12/30/11 Spouse/Roger states she sore throat,  sinus pain, chest congestion, hoarseness, non productove cough  Afebrile. All emergent symptoms ruled out per Sore throat or Hoarseness protocol with exception " Hoarseness."  Home care advice given.

## 2012-01-03 DIAGNOSIS — R030 Elevated blood-pressure reading, without diagnosis of hypertension: Secondary | ICD-10-CM | POA: Diagnosis not present

## 2012-01-03 DIAGNOSIS — J069 Acute upper respiratory infection, unspecified: Secondary | ICD-10-CM | POA: Diagnosis not present

## 2012-01-15 DIAGNOSIS — M5137 Other intervertebral disc degeneration, lumbosacral region: Secondary | ICD-10-CM | POA: Diagnosis not present

## 2012-01-15 DIAGNOSIS — M5030 Other cervical disc degeneration, unspecified cervical region: Secondary | ICD-10-CM | POA: Diagnosis not present

## 2012-02-06 ENCOUNTER — Other Ambulatory Visit: Payer: Self-pay | Admitting: Internal Medicine

## 2012-02-06 NOTE — Telephone Encounter (Signed)
Spoke with pt & she states that she has started back taking the med. Sent in rx.

## 2012-02-06 NOTE — Telephone Encounter (Signed)
Last time she was here she said she was not taking carbidopa-levodopa however if she likes a refill okay to restart

## 2012-02-06 NOTE — Telephone Encounter (Signed)
Has this med been discontinued? Please advise.

## 2012-02-11 ENCOUNTER — Telehealth: Payer: Self-pay | Admitting: Internal Medicine

## 2012-02-11 MED ORDER — ZOLPIDEM TARTRATE 10 MG PO TABS: 10.0000 mg | ORAL_TABLET | Freq: Every evening | ORAL | Status: DC | PRN

## 2012-02-11 NOTE — Telephone Encounter (Signed)
Refill: Zolpidem tartrate 10 mg tablet. Take 1 tablet by mouth at bedtime as needed. Last fill 11-04-11

## 2012-02-11 NOTE — Telephone Encounter (Signed)
Refill done.  

## 2012-02-11 NOTE — Telephone Encounter (Signed)
Ok to refill 

## 2012-02-23 ENCOUNTER — Other Ambulatory Visit: Payer: Self-pay | Admitting: Internal Medicine

## 2012-02-25 NOTE — Telephone Encounter (Signed)
Refill done.  

## 2012-05-13 DIAGNOSIS — C50119 Malignant neoplasm of central portion of unspecified female breast: Secondary | ICD-10-CM | POA: Diagnosis not present

## 2012-05-22 DIAGNOSIS — C50119 Malignant neoplasm of central portion of unspecified female breast: Secondary | ICD-10-CM | POA: Diagnosis not present

## 2012-05-23 DIAGNOSIS — C50119 Malignant neoplasm of central portion of unspecified female breast: Secondary | ICD-10-CM | POA: Diagnosis not present

## 2012-06-10 DIAGNOSIS — Z853 Personal history of malignant neoplasm of breast: Secondary | ICD-10-CM | POA: Diagnosis not present

## 2012-06-10 DIAGNOSIS — M810 Age-related osteoporosis without current pathological fracture: Secondary | ICD-10-CM | POA: Diagnosis not present

## 2012-06-11 DIAGNOSIS — E039 Hypothyroidism, unspecified: Secondary | ICD-10-CM | POA: Diagnosis not present

## 2012-06-11 DIAGNOSIS — E119 Type 2 diabetes mellitus without complications: Secondary | ICD-10-CM | POA: Diagnosis not present

## 2012-06-11 DIAGNOSIS — Z23 Encounter for immunization: Secondary | ICD-10-CM | POA: Diagnosis not present

## 2012-06-11 DIAGNOSIS — G2581 Restless legs syndrome: Secondary | ICD-10-CM | POA: Diagnosis not present

## 2012-06-11 DIAGNOSIS — K219 Gastro-esophageal reflux disease without esophagitis: Secondary | ICD-10-CM | POA: Diagnosis not present

## 2012-06-11 DIAGNOSIS — Z0000 Encounter for general adult medical examination without abnormal findings: Secondary | ICD-10-CM | POA: Diagnosis not present

## 2012-07-05 ENCOUNTER — Other Ambulatory Visit: Payer: Self-pay

## 2012-07-11 DIAGNOSIS — E119 Type 2 diabetes mellitus without complications: Secondary | ICD-10-CM | POA: Diagnosis not present

## 2012-07-11 DIAGNOSIS — K7689 Other specified diseases of liver: Secondary | ICD-10-CM | POA: Diagnosis not present

## 2012-07-21 ENCOUNTER — Telehealth: Payer: Self-pay | Admitting: Internal Medicine

## 2012-07-21 NOTE — Telephone Encounter (Signed)
Open in error

## 2012-08-06 ENCOUNTER — Encounter: Payer: Self-pay | Admitting: Internal Medicine

## 2012-08-11 ENCOUNTER — Telehealth: Payer: Self-pay | Admitting: *Deleted

## 2012-08-11 NOTE — Telephone Encounter (Signed)
ambien 10mg  Last OV 8.1.13 Last filled 9.23.13

## 2012-08-12 ENCOUNTER — Encounter: Payer: Self-pay | Admitting: *Deleted

## 2012-08-12 MED ORDER — ZOLPIDEM TARTRATE 10 MG PO TABS: 10.0000 mg | ORAL_TABLET | Freq: Every evening | ORAL | Status: DC | PRN

## 2012-08-12 NOTE — Telephone Encounter (Signed)
Pt made aware rx & controlled substance contract is ready to be picked up at front desk.  

## 2012-08-12 NOTE — Telephone Encounter (Signed)
Done

## 2012-08-20 DIAGNOSIS — Z79899 Other long term (current) drug therapy: Secondary | ICD-10-CM | POA: Diagnosis not present

## 2012-08-29 ENCOUNTER — Telehealth: Payer: Self-pay | Admitting: Internal Medicine

## 2012-08-29 NOTE — Telephone Encounter (Signed)
Per CY-suggest she see her PCP to review status

## 2012-08-29 NOTE — Telephone Encounter (Signed)
Spoke with patient:  States hives have returned x 1 month Had been taking Diphenhydramine 25mg  daily at night-- this was when the hives were first occuring in the morning Then hives began occuring anytime and she felt like she needed something that lasted all day  So started taking fexofenadine 180mg  daily Patient unsure which medication she should be taking and which dosing for these hives Patient last seen 10/06/09 and I have attempted to schedule patient to be seen Patient refuses to make appt states CY and her have talked about this several times and he has done all the testing on her possible and they have not been able to find out what's wrong with her Therefore she sees no reason to come in and be seen  Dr. Maple Hudson please advise on this, thank you!  Last OV Note instructions 10/06/09 Patient Instructions:  1) Please schedule a follow-up appointment in 1 month.  2) Lab  3) Try for a month without enzyme augmented laundry products, antibacterial soaps and etc.  4) Suggest otc nondrowsy antihistamines:  5) Loratadine/ Claritin  6) fexofenadine/ Allegra 60 mg or 180 mg  Allergies  Allergen Reactions  . Famotidine   . Iodine   . Nsaids     REACTION: hallucinations

## 2012-08-29 NOTE — Telephone Encounter (Signed)
Spoke with patient. Aware of recs per CY. Nothing further needed at this time

## 2012-09-18 ENCOUNTER — Encounter: Payer: Self-pay | Admitting: Internal Medicine

## 2012-10-10 ENCOUNTER — Other Ambulatory Visit: Payer: Self-pay | Admitting: Internal Medicine

## 2012-10-10 NOTE — Telephone Encounter (Signed)
Refill done.  

## 2012-10-11 ENCOUNTER — Other Ambulatory Visit: Payer: Self-pay | Admitting: Internal Medicine

## 2012-10-14 ENCOUNTER — Encounter: Payer: Self-pay | Admitting: *Deleted

## 2012-10-14 NOTE — Telephone Encounter (Signed)
Refill done.  Letter mailed to pt to make aware she is due for a yearly office visit.

## 2012-11-05 DIAGNOSIS — M545 Low back pain, unspecified: Secondary | ICD-10-CM | POA: Diagnosis not present

## 2012-11-05 DIAGNOSIS — M542 Cervicalgia: Secondary | ICD-10-CM | POA: Diagnosis not present

## 2012-11-25 DIAGNOSIS — M542 Cervicalgia: Secondary | ICD-10-CM | POA: Diagnosis not present

## 2012-11-25 DIAGNOSIS — M549 Dorsalgia, unspecified: Secondary | ICD-10-CM | POA: Diagnosis not present

## 2012-11-27 ENCOUNTER — Ambulatory Visit (INDEPENDENT_AMBULATORY_CARE_PROVIDER_SITE_OTHER): Payer: Medicare Other | Admitting: Internal Medicine

## 2012-11-27 ENCOUNTER — Other Ambulatory Visit: Payer: Self-pay | Admitting: Neurological Surgery

## 2012-11-27 VITALS — BP 140/68 | HR 73 | Temp 98.2°F | Wt 163.6 lb

## 2012-11-27 DIAGNOSIS — E785 Hyperlipidemia, unspecified: Secondary | ICD-10-CM

## 2012-11-27 DIAGNOSIS — I359 Nonrheumatic aortic valve disorder, unspecified: Secondary | ICD-10-CM

## 2012-11-27 DIAGNOSIS — I10 Essential (primary) hypertension: Secondary | ICD-10-CM | POA: Diagnosis not present

## 2012-11-27 DIAGNOSIS — R079 Chest pain, unspecified: Secondary | ICD-10-CM

## 2012-11-27 DIAGNOSIS — Z0000 Encounter for general adult medical examination without abnormal findings: Secondary | ICD-10-CM

## 2012-11-27 DIAGNOSIS — M810 Age-related osteoporosis without current pathological fracture: Secondary | ICD-10-CM | POA: Diagnosis not present

## 2012-11-27 DIAGNOSIS — M542 Cervicalgia: Secondary | ICD-10-CM

## 2012-11-27 DIAGNOSIS — M1990 Unspecified osteoarthritis, unspecified site: Secondary | ICD-10-CM

## 2012-11-27 DIAGNOSIS — N390 Urinary tract infection, site not specified: Secondary | ICD-10-CM | POA: Diagnosis not present

## 2012-11-27 LAB — POCT URINALYSIS DIPSTICK
Bilirubin, UA: NEGATIVE
Blood, UA: NEGATIVE
Glucose, UA: NEGATIVE
Leukocytes, UA: NEGATIVE
Nitrite, UA: NEGATIVE

## 2012-11-27 MED ORDER — DENOSUMAB 60 MG/ML ~~LOC~~ SOLN
60.0000 mg | Freq: Once | SUBCUTANEOUS | Status: AC
  Administered 2012-11-27: 60 mg via SUBCUTANEOUS

## 2012-11-27 NOTE — Assessment & Plan Note (Signed)
Off arimidex after 5 years.

## 2012-11-27 NOTE — Assessment & Plan Note (Signed)
BPs usually in the 140/60, no change.Check a CMP

## 2012-11-27 NOTE — Progress Notes (Signed)
  Subjective:    Patient ID: Sharon Armstrong, female    DOB: 25-May-1940, 72 y.o.   MRN: [id number]  HPI Routine office visit History of the spinal stenosis, Dr. Yetta Barre is recommended surgery, she's not sure, would like a second opinion. Diabetes, good compliance with insulin and metformin, see assessment Hypertension, ambulatory BPs 140s/ 60s Breast cancer, finally off meds  after 5 years. RLS, symptoms well controlled Hypothyroidism, good compliance with medication Insomnia well-controlled. Mild dysuria for few days, would like her urine checked.   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 No  nausea, vomiting or diarrhea or blood in the urine     Objective:   Physical Exam BP 140/68  Pulse 73  Temp(Src) 98.2 F (36.8 C) (Oral)  Wt 163 lb 9.6 oz (74.208 kg)  BMI 28.07 kg/m2  SpO2 95% General -- alert, well-developed, NAD .   Neck --no thyromegaly Lungs -- normal respiratory effort, no intercostal retractions, no accessory muscle use, and normal breath sounds.   Heart-- normal rate, regular rhythm, no murmur, and no gallop.   Abdomen--soft, non-tender, no distention, no masses.   Extremities-- no pretibial edema bilaterally  Psych-- Cognition and judgment appear intact. Alert and cooperative with normal attention span and concentration.  not anxious appearing and not depressed appearing.        Assessment & Plan:  Today , I spent more than 50 min with the patient, >50% of the time counseling, and addressing multiple medical issues

## 2012-11-27 NOTE — Assessment & Plan Note (Signed)
Compliance with medicines, check a hemoglobin A1c

## 2012-11-27 NOTE — Assessment & Plan Note (Signed)
During her physical exam elsewhere. Patient aware I'm not tracking her colonoscopies, mammograms, immunizations etc.

## 2012-11-27 NOTE — Patient Instructions (Addendum)
Come back fasting: CMP--- dx  hypertension FLP --- dx hyperlipidemia Hemoglobin A1c ---- dx diabetes TSH --- dx hypothyroidism Vitamin D--- dx  Osteoporosis Next visit in 4 months

## 2012-11-27 NOTE — Assessment & Plan Note (Signed)
Reports  Cholesterol has been checked by Dr. Sharen Hones but does not know results. Will check a cholesterol panel

## 2012-11-27 NOTE — Assessment & Plan Note (Signed)
History of spinal stenosis, likes a second opinion in regards surgery, likes to be referred to Dr. Manson Passey in [place] for consideration of a laminoscopy. Will arrange

## 2012-11-27 NOTE — Assessment & Plan Note (Addendum)
prolia injection today. Recommend daily calcium and vitamin D. Bone density test on RTC

## 2012-11-27 NOTE — Assessment & Plan Note (Signed)
Echocardiogram 2011 - Left ventricle: There was mild focal basal hypertrophy of the septum. Systolic function was normal. The estimated ejection fraction was in the range of 60% to 65%. Doppler parameters are consistent with abnormal left ventricular relaxation (grade 1 diastolic dysfunction). - Aortic valve: Mild regurgitation.

## 2012-11-27 NOTE — Assessment & Plan Note (Signed)
Good compliance of medication, check a TSH 

## 2012-11-27 NOTE — Assessment & Plan Note (Addendum)
At the end of the visit, after the prolia injection developed chest pain. Reports the chest pain happens every few months for the last few years. Usually lasts 10  minutes Does not look sweaty, denies nausea. EKG today-- no acute changes, at baseline Chart reviewed,  was seen by cardiology before, last time 2011, had a negative stress test. Plan: cards referral

## 2012-11-27 NOTE — Assessment & Plan Note (Addendum)
UTI? Requests a  Urinalysis ---- Udip (-)

## 2012-11-28 ENCOUNTER — Encounter: Payer: Self-pay | Admitting: Internal Medicine

## 2012-12-01 ENCOUNTER — Ambulatory Visit
Admission: RE | Admit: 2012-12-01 | Discharge: 2012-12-01 | Disposition: A | Discharge status: Home / Self Care | Payer: Medicare Other | Source: Ambulatory Visit | Attending: Neurological Surgery | Admitting: Neurological Surgery

## 2012-12-01 DIAGNOSIS — M542 Cervicalgia: Secondary | ICD-10-CM | POA: Diagnosis not present

## 2012-12-01 DIAGNOSIS — M47812 Spondylosis without myelopathy or radiculopathy, cervical region: Secondary | ICD-10-CM | POA: Diagnosis not present

## 2012-12-15 ENCOUNTER — Other Ambulatory Visit: Payer: Self-pay | Admitting: Neurological Surgery

## 2012-12-15 DIAGNOSIS — M4802 Spinal stenosis, cervical region: Secondary | ICD-10-CM | POA: Diagnosis not present

## 2012-12-16 ENCOUNTER — Other Ambulatory Visit: Payer: Self-pay | Admitting: Internal Medicine

## 2012-12-17 NOTE — Telephone Encounter (Signed)
Ok 30 and 5 RF 

## 2012-12-17 NOTE — Telephone Encounter (Signed)
Refill done per orders.  

## 2012-12-17 NOTE — Telephone Encounter (Signed)
Ok to refill medication? Pt. Takes as needed for RLS Last OV 7.10.14 Last filled 5.24.14 #30 no refills.

## 2013-01-01 ENCOUNTER — Other Ambulatory Visit: Payer: Self-pay | Admitting: Neurological Surgery

## 2013-01-01 ENCOUNTER — Encounter (HOSPITAL_COMMUNITY)
Admission: RE | Admit: 2013-01-01 | Discharge: 2013-01-01 | Disposition: A | Discharge status: Home / Self Care | Payer: Medicare Other | Source: Ambulatory Visit | Attending: Neurological Surgery | Admitting: Neurological Surgery

## 2013-01-01 ENCOUNTER — Encounter (HOSPITAL_COMMUNITY): Payer: Self-pay

## 2013-01-01 DIAGNOSIS — Z01812 Encounter for preprocedural laboratory examination: Secondary | ICD-10-CM | POA: Insufficient documentation

## 2013-01-01 DIAGNOSIS — Z01818 Encounter for other preprocedural examination: Secondary | ICD-10-CM | POA: Insufficient documentation

## 2013-01-01 HISTORY — DX: Other difficulties with micturition: R39.198

## 2013-01-01 HISTORY — DX: Urticaria, unspecified: L50.9

## 2013-01-01 HISTORY — DX: Restless legs syndrome: G25.81

## 2013-01-01 HISTORY — DX: Other constipation: K59.09

## 2013-01-01 LAB — CBC
MCV: 90.4 fL (ref 78.0–100.0)
Platelets: 168 10*3/uL (ref 150–400)
RBC: 4.17 MIL/uL (ref 3.87–5.11)
RDW: 14 % (ref 11.5–15.5)
WBC: 7.1 10*3/uL (ref 4.0–10.5)

## 2013-01-01 LAB — BASIC METABOLIC PANEL
CO2: 25 mEq/L (ref 19–32)
Calcium: 10.1 mg/dL (ref 8.4–10.5)
Creatinine, Ser: 0.65 mg/dL (ref 0.50–1.10)
GFR calc non Af Amer: 87 mL/min — ABNORMAL LOW (ref >90)
Sodium: 138 mEq/L (ref 135–145)

## 2013-01-01 LAB — SURGICAL PCR SCREEN
MRSA, PCR: NEGATIVE
Staphylococcus aureus: NEGATIVE

## 2013-01-01 NOTE — Progress Notes (Addendum)
Patient informed Nurse that she had a stress test with Dr. Clifton James 4 years ago and has not seen him since. Encounters in [facility]. Patient also had a cardiac cath in 2001 but denied having a PCI.

## 2013-01-01 NOTE — Progress Notes (Signed)
During PAT visit patient informed Nurse that she thought she was having surgery on Cervical three-six. Nurse called Erie Noe at Dr. Yetta Barre office and she stated that cervical three was missing off consent form. Patient informed that consent form would be signed the DOS. Patient verbalized understanding.

## 2013-01-01 NOTE — Pre-Procedure Instructions (Signed)
Sharon Armstrong  01/01/2013   Your procedure is scheduled on:  Thursday January 08, 2013.  Report to [name] at 8:45 AM.  Call this number if you have problems the morning of surgery: [phone]   Remember:   Do not eat food or drink liquids after midnight.   Take these medicines the morning of surgery with A SIP OF WATER: Tramadol if needed for pain  Do NOT take any diabetic medications the morning of your surgery  Discontinue aspirin and herbal medications 7 days before surgery    Do not wear jewelry, make-up or nail polish.  Do not wear lotions, powders, or perfumes. You may wear deodorant.  Men may shave face and neck.  Do not bring valuables to the hospital.  [name] is not responsible for any belongings or valuables.  Contacts, dentures or bridgework may not be worn into surgery.  Leave suitcase in the car. After surgery it may be brought to your room.  For patients admitted to the hospital, checkout time is 11:00 AM the day of discharge.   Patients discharged the day of surgery will not be allowed to drive home.  Name and phone number of your driver:    Special Instructions: Shower using CHG 2 nights before surgery and the night before surgery.  If you shower the day of surgery use CHG.  Use special wash - you have one bottle of CHG for all showers.  You should use approximately 1/3 of the bottle for each shower.   Please read over the following fact sheets that you were given: Pain Booklet, Coughing and Deep Breathing, MRSA Information and Surgical Site Infection Prevention

## 2013-01-02 ENCOUNTER — Encounter: Payer: Self-pay | Admitting: Gastroenterology

## 2013-01-02 LAB — HEPATIC FUNCTION PANEL
ALT: 76 U/L — ABNORMAL HIGH (ref 0–35)
AST: 81 U/L — ABNORMAL HIGH (ref 0–37)
Bilirubin, Direct: <0.1 mg/dL (ref 0.0–0.3)
Total Protein: 7.4 g/dL (ref 6.0–8.3)

## 2013-01-02 NOTE — Progress Notes (Addendum)
Anesthesia Chart Review:  Patient is a 72 year old female scheduled for C3-4, C4-5, C5-6 ACDF on 01/08/13 by Dr. Yetta Barre.  History includes HTN, non-smoker, HLD, DM2, RLS, breast cancer s/p left breast lumpectomy with radiation '09, hypothyroidism, anxiety, osteoarthritis, positive PPD, cataract extraction, fatty liver with history of elevated LFTs, diverticulosis, mild AR by 09/2009 echo. PCP is Dr. Willow Ora.  EKG on 11/27/12 showed SR, LAFB, voltage criteria for LVH, anteroseptal infarct (age undetermined).  EKG appears stable since at least 12/23/08.  She was seen by cardiologist Dr. Clifton James in May 2011 for evaluation of chest pain.  She had a normal stress nuclear study, EF 78% on 09/29/09. Cardiac cath on 12/16/00 showed no flow limiting CAD, EF 60%.  Echo on 09/29/09 showed: - Left ventricle: There was mild focal basal hypertrophy of the septum. Systolic function was normal. The estimated ejection fraction was in the range of 60% to 65%. Doppler parameters are consistent with abnormal left ventricular relaxation (grade 1 diastolic dysfunction). - Mitral valve: Trivial regurgitation. - Aortic valve: Mild regurgitation. - Tricuspid valve: Trivial regurgitation.  CXR on 01/01/13 showed no active disease.  Preoperative labs noted.  HFP added due to history of elevated LFTs with results showing Alk Phos 36, AST 81, ALT 76.  (AST range 50-82 and ALT 39-94 since 08/2006 -  12/2011.) PLT count 168K.  A1C on 12/20/11 was 6.7, TSH 0.73.  She has had some fluctuation in her AST/ALT but most commonly her numbers have been in the 70's since 2008, so I will not plan to repeat preoperatively.  I'll add a PT/PTT on the day of surgery since AST/ALT are just over 2X normal.  Her last office visit with Dr. Drue Novel mentioned cardiology referral due to chest pain during Prolia injection for osteoporosis.  Notes indicate she has had chest pain every few months for last few years without associated symptoms of diaphoresis or nausea.   She had prior cardiology evaluation with negative stress test in 2011.  I have sent Dr. Drue Novel a staff message regarding her upcoming surgery.  I will follow-up once I receive additional input.  Erie Noe at Dr. Yetta Barre' office updated.  Velna Ochs [facility]/Anesthesiology Phone [phone] 01/02/2013 12:34 PM  Addendum: 01/05/13 12:36 PM I have yet to hear from Dr. Drue Novel.  I did review above with anesthesiologist Dr. Sampson Goon.  By notes, patient's chest pain was associated with an injection and prior episodes had occurred for "years" with fairly unremarkable cardiac work-up except mild AR just over three years ago. Her EKG has been stable since.  Unless she was having new or progressive chest pain symptoms, Dr. Sampson Goon did not feel she would necessarily require cardiology evaluation preoperatively.  She will be evaluated by her assigned anesthesiologist on the day of surgery.

## 2013-01-06 ENCOUNTER — Telehealth: Payer: Self-pay | Admitting: Internal Medicine

## 2013-01-06 NOTE — Telephone Encounter (Signed)
Please advise 

## 2013-01-06 NOTE — Telephone Encounter (Signed)
I declined to prescribe estrogen giving history of breast cancer. She needs to discuss with urology or her doctor in [place] who does her female care

## 2013-01-06 NOTE — Telephone Encounter (Signed)
Patient is calling to request that Dr. Drue Novel give her a prescription for Estrase 0.1mg . She says that her Urologist gave her samples of it two years ago and she does not want to have to go back their for another visit for more samples. Please advise. She uses [facility].

## 2013-01-07 ENCOUNTER — Institutional Professional Consult (permissible substitution): Payer: Medicare Other | Admitting: Cardiology

## 2013-01-07 MED ORDER — CEFAZOLIN SODIUM-DEXTROSE 2-3 GM-% IV SOLR
2.0000 g | INTRAVENOUS | Status: AC
  Administered 2013-01-08: 2 g via INTRAVENOUS
  Filled 2013-01-07: qty 50

## 2013-01-07 NOTE — Telephone Encounter (Signed)
lmovm to return call  °

## 2013-01-07 NOTE — Telephone Encounter (Signed)
Discussed with patient, verbalized understanding. States she will follow up with Urology and call if further concern.

## 2013-01-08 ENCOUNTER — Inpatient Hospital Stay (HOSPITAL_COMMUNITY): Payer: Medicare Other | Admitting: Anesthesiology

## 2013-01-08 ENCOUNTER — Encounter (HOSPITAL_COMMUNITY): Payer: Self-pay | Admitting: *Deleted

## 2013-01-08 ENCOUNTER — Inpatient Hospital Stay (HOSPITAL_COMMUNITY): Payer: Medicare Other

## 2013-01-08 ENCOUNTER — Encounter (HOSPITAL_COMMUNITY): Payer: Self-pay | Admitting: Vascular Surgery

## 2013-01-08 ENCOUNTER — Inpatient Hospital Stay (HOSPITAL_COMMUNITY)
Admission: RE | Admit: 2013-01-08 | Discharge: 2013-01-09 | DRG: 473 | Disposition: A | Discharge status: Home / Self Care | Payer: Medicare Other | Source: Ambulatory Visit | Attending: Neurological Surgery | Admitting: Neurological Surgery

## 2013-01-08 ENCOUNTER — Encounter (HOSPITAL_COMMUNITY)
Admission: RE | Disposition: A | Discharge status: Home / Self Care | Payer: Self-pay | Source: Ambulatory Visit | Attending: Neurological Surgery

## 2013-01-08 DIAGNOSIS — Z9849 Cataract extraction status, unspecified eye: Secondary | ICD-10-CM

## 2013-01-08 DIAGNOSIS — Z794 Long term (current) use of insulin: Secondary | ICD-10-CM

## 2013-01-08 DIAGNOSIS — G2581 Restless legs syndrome: Secondary | ICD-10-CM | POA: Diagnosis present

## 2013-01-08 DIAGNOSIS — E039 Hypothyroidism, unspecified: Secondary | ICD-10-CM | POA: Diagnosis present

## 2013-01-08 DIAGNOSIS — Z79899 Other long term (current) drug therapy: Secondary | ICD-10-CM

## 2013-01-08 DIAGNOSIS — F411 Generalized anxiety disorder: Secondary | ICD-10-CM | POA: Diagnosis present

## 2013-01-08 DIAGNOSIS — I10 Essential (primary) hypertension: Secondary | ICD-10-CM | POA: Diagnosis present

## 2013-01-08 DIAGNOSIS — K5730 Diverticulosis of large intestine without perforation or abscess without bleeding: Secondary | ICD-10-CM | POA: Diagnosis present

## 2013-01-08 DIAGNOSIS — E785 Hyperlipidemia, unspecified: Secondary | ICD-10-CM | POA: Diagnosis present

## 2013-01-08 DIAGNOSIS — IMO0002 Reserved for concepts with insufficient information to code with codable children: Secondary | ICD-10-CM | POA: Diagnosis not present

## 2013-01-08 DIAGNOSIS — K5900 Constipation, unspecified: Secondary | ICD-10-CM | POA: Diagnosis present

## 2013-01-08 DIAGNOSIS — Z8249 Family history of ischemic heart disease and other diseases of the circulatory system: Secondary | ICD-10-CM | POA: Diagnosis not present

## 2013-01-08 DIAGNOSIS — Z801 Family history of malignant neoplasm of trachea, bronchus and lung: Secondary | ICD-10-CM | POA: Diagnosis not present

## 2013-01-08 DIAGNOSIS — M8580 Other specified disorders of bone density and structure, unspecified site: Secondary | ICD-10-CM

## 2013-01-08 DIAGNOSIS — Z853 Personal history of malignant neoplasm of breast: Secondary | ICD-10-CM | POA: Diagnosis not present

## 2013-01-08 DIAGNOSIS — M4712 Other spondylosis with myelopathy, cervical region: Principal | ICD-10-CM | POA: Diagnosis present

## 2013-01-08 DIAGNOSIS — Z981 Arthrodesis status: Secondary | ICD-10-CM

## 2013-01-08 DIAGNOSIS — M5090 Cervical disc disorder, unspecified, unspecified cervical region: Secondary | ICD-10-CM | POA: Diagnosis not present

## 2013-01-08 DIAGNOSIS — Z923 Personal history of irradiation: Secondary | ICD-10-CM | POA: Diagnosis not present

## 2013-01-08 DIAGNOSIS — Z9089 Acquired absence of other organs: Secondary | ICD-10-CM

## 2013-01-08 DIAGNOSIS — E119 Type 2 diabetes mellitus without complications: Secondary | ICD-10-CM | POA: Diagnosis present

## 2013-01-08 DIAGNOSIS — M47812 Spondylosis without myelopathy or radiculopathy, cervical region: Secondary | ICD-10-CM | POA: Diagnosis not present

## 2013-01-08 DIAGNOSIS — M4802 Spinal stenosis, cervical region: Secondary | ICD-10-CM | POA: Diagnosis not present

## 2013-01-08 HISTORY — PX: ANTERIOR CERVICAL DECOMP/DISCECTOMY FUSION: SHX1161

## 2013-01-08 LAB — GLUCOSE, CAPILLARY
Glucose-Capillary: 115 mg/dL — ABNORMAL HIGH (ref 70–99)
Glucose-Capillary: 164 mg/dL — ABNORMAL HIGH (ref 70–99)

## 2013-01-08 LAB — PROTIME-INR: INR: 1.15 (ref 0.00–1.49)

## 2013-01-08 LAB — APTT: aPTT: 30 seconds (ref 24–37)

## 2013-01-08 SURGERY — ANTERIOR CERVICAL DECOMPRESSION/DISCECTOMY FUSION 3 LEVELS
Anesthesia: General | Site: Neck | Wound class: Clean

## 2013-01-08 MED ORDER — MEPERIDINE HCL 25 MG/ML IJ SOLN: 6.2500 mg | INTRAMUSCULAR | Status: DC | PRN

## 2013-01-08 MED ORDER — GLYCOPYRROLATE 0.2 MG/ML IJ SOLN
INTRAMUSCULAR | Status: DC | PRN
  Administered 2013-01-08: .6 mg via INTRAVENOUS

## 2013-01-08 MED ORDER — SENNA 8.6 MG PO TABS
1.0000 | ORAL_TABLET | Freq: Two times a day (BID) | ORAL | Status: DC
  Administered 2013-01-08 – 2013-01-09 (×2): 8.6 mg via ORAL
  Filled 2013-01-08 (×3): qty 1

## 2013-01-08 MED ORDER — SODIUM CHLORIDE 0.9 % IJ SOLN: 3.0000 mL | INTRAMUSCULAR | Status: DC | PRN

## 2013-01-08 MED ORDER — PHENOL 1.4 % MT LIQD
1.0000 | OROMUCOSAL | Status: DC | PRN
  Administered 2013-01-08: 1 via OROMUCOSAL
  Filled 2013-01-08: qty 177

## 2013-01-08 MED ORDER — ROCURONIUM BROMIDE 100 MG/10ML IV SOLN
INTRAVENOUS | Status: DC | PRN
  Administered 2013-01-08 (×2): 10 mg via INTRAVENOUS
  Administered 2013-01-08: 50 mg via INTRAVENOUS
  Administered 2013-01-08: 20 mg via INTRAVENOUS

## 2013-01-08 MED ORDER — DENOSUMAB 60 MG/ML ~~LOC~~ SOLN: 60.0000 mg | Freq: Once | SUBCUTANEOUS | Status: DC

## 2013-01-08 MED ORDER — ONDANSETRON HCL 4 MG/2ML IJ SOLN: 4.0000 mg | INTRAMUSCULAR | Status: DC | PRN

## 2013-01-08 MED ORDER — CARBIDOPA-LEVODOPA 25-100 MG PO TABS
0.5000 | ORAL_TABLET | Freq: Three times a day (TID) | ORAL | Status: DC
  Administered 2013-01-08: 22:00:00 via ORAL
  Administered 2013-01-08 – 2013-01-09 (×2): 0.5 via ORAL
  Filled 2013-01-08 (×5): qty 0.5

## 2013-01-08 MED ORDER — LISINOPRIL 5 MG PO TABS
5.0000 mg | ORAL_TABLET | Freq: Every day | ORAL | Status: DC
  Administered 2013-01-08 – 2013-01-09 (×2): 5 mg via ORAL
  Filled 2013-01-08 (×2): qty 1

## 2013-01-08 MED ORDER — LEVOTHYROXINE SODIUM 125 MCG PO TABS
125.0000 ug | ORAL_TABLET | Freq: Every day | ORAL | Status: DC
  Administered 2013-01-09: 125 ug via ORAL
  Filled 2013-01-08 (×2): qty 1

## 2013-01-08 MED ORDER — OXYCODONE-ACETAMINOPHEN 5-325 MG PO TABS
1.0000 | ORAL_TABLET | ORAL | Status: DC | PRN
  Administered 2013-01-08: 2 via ORAL
  Administered 2013-01-08: 1 via ORAL
  Administered 2013-01-09: 2 via ORAL
  Filled 2013-01-08 (×2): qty 2
  Filled 2013-01-08: qty 1

## 2013-01-08 MED ORDER — THROMBIN 20000 UNITS EX SOLR
CUTANEOUS | Status: DC | PRN
  Administered 2013-01-08: 13:00:00 via TOPICAL

## 2013-01-08 MED ORDER — LACTATED RINGERS IV SOLN
INTRAVENOUS | Status: DC
  Administered 2013-01-08 (×3): via INTRAVENOUS

## 2013-01-08 MED ORDER — METFORMIN HCL 500 MG PO TABS
500.0000 mg | ORAL_TABLET | Freq: Three times a day (TID) | ORAL | Status: DC
  Administered 2013-01-08 – 2013-01-09 (×2): 500 mg via ORAL
  Filled 2013-01-08 (×5): qty 1

## 2013-01-08 MED ORDER — PROPOFOL 10 MG/ML IV BOLUS
INTRAVENOUS | Status: DC | PRN
  Administered 2013-01-08: 100 mg via INTRAVENOUS

## 2013-01-08 MED ORDER — SODIUM CHLORIDE 0.9 % IV SOLN: 250.0000 mL | INTRAVENOUS | Status: DC

## 2013-01-08 MED ORDER — BACLOFEN 5 MG HALF TABLET
5.0000 mg | ORAL_TABLET | Freq: Three times a day (TID) | ORAL | Status: DC
  Administered 2013-01-08 – 2013-01-09 (×3): 5 mg via ORAL
  Filled 2013-01-08 (×5): qty 1

## 2013-01-08 MED ORDER — BUPIVACAINE HCL (PF) 0.25 % IJ SOLN
INTRAMUSCULAR | Status: DC | PRN
  Administered 2013-01-08: 4 mL

## 2013-01-08 MED ORDER — POTASSIUM CHLORIDE IN NACL 20-0.9 MEQ/L-% IV SOLN
INTRAVENOUS | Status: DC
  Filled 2013-01-08 (×3): qty 1000

## 2013-01-08 MED ORDER — SODIUM CHLORIDE 0.9 % IJ SOLN
3.0000 mL | Freq: Two times a day (BID) | INTRAMUSCULAR | Status: DC
  Administered 2013-01-08 – 2013-01-09 (×2): 3 mL via INTRAVENOUS

## 2013-01-08 MED ORDER — MIDAZOLAM HCL 2 MG/2ML IJ SOLN
0.5000 mg | Freq: Once | INTRAMUSCULAR | Status: AC | PRN
Start: 2013-01-08 — End: 2013-01-08
  Administered 2013-01-08 (×2): 1 mg via INTRAVENOUS

## 2013-01-08 MED ORDER — FENTANYL CITRATE 0.05 MG/ML IJ SOLN
INTRAMUSCULAR | Status: DC | PRN
  Administered 2013-01-08: 50 ug via INTRAVENOUS
  Administered 2013-01-08: 100 ug via INTRAVENOUS
  Administered 2013-01-08: 250 ug via INTRAVENOUS

## 2013-01-08 MED ORDER — GELATIN ABSORBABLE MT POWD
OROMUCOSAL | Status: DC | PRN
  Administered 2013-01-08: 13:00:00 via TOPICAL

## 2013-01-08 MED ORDER — MORPHINE SULFATE 2 MG/ML IJ SOLN
1.0000 mg | INTRAMUSCULAR | Status: DC | PRN
  Administered 2013-01-08 (×2): 2 mg via INTRAVENOUS
  Administered 2013-01-09: 4 mg via INTRAVENOUS
  Filled 2013-01-08 (×2): qty 1
  Filled 2013-01-08: qty 2

## 2013-01-08 MED ORDER — ZOLPIDEM TARTRATE 5 MG PO TABS: 5.0000 mg | ORAL_TABLET | Freq: Every evening | ORAL | Status: DC | PRN

## 2013-01-08 MED ORDER — ONDANSETRON HCL 4 MG/2ML IJ SOLN
INTRAMUSCULAR | Status: DC | PRN
  Administered 2013-01-08: 4 mg via INTRAVENOUS

## 2013-01-08 MED ORDER — ACETAMINOPHEN 650 MG RE SUPP: 650.0000 mg | RECTAL | Status: DC | PRN

## 2013-01-08 MED ORDER — TRIAMTERENE-HCTZ 37.5-25 MG PO TABS
1.0000 | ORAL_TABLET | Freq: Every day | ORAL | Status: DC
  Administered 2013-01-08 – 2013-01-09 (×2): 1 via ORAL
  Filled 2013-01-08 (×2): qty 1

## 2013-01-08 MED ORDER — 0.9 % SODIUM CHLORIDE (POUR BTL) OPTIME
TOPICAL | Status: DC | PRN
  Administered 2013-01-08: 1000 mL

## 2013-01-08 MED ORDER — SODIUM CHLORIDE 0.9 % IR SOLN
Status: DC | PRN
  Administered 2013-01-08: 13:00:00

## 2013-01-08 MED ORDER — METHOCARBAMOL 100 MG/ML IJ SOLN: 500.0000 mg | Freq: Four times a day (QID) | INTRAMUSCULAR | Status: DC | PRN

## 2013-01-08 MED ORDER — PROMETHAZINE HCL 25 MG/ML IJ SOLN: 6.2500 mg | INTRAMUSCULAR | Status: DC | PRN

## 2013-01-08 MED ORDER — HYDROMORPHONE HCL PF 1 MG/ML IJ SOLN: 0.2500 mg | INTRAMUSCULAR | Status: DC | PRN

## 2013-01-08 MED ORDER — METHOCARBAMOL 500 MG PO TABS
500.0000 mg | ORAL_TABLET | Freq: Four times a day (QID) | ORAL | Status: DC | PRN
  Administered 2013-01-08 – 2013-01-09 (×3): 500 mg via ORAL
  Filled 2013-01-08 (×3): qty 1

## 2013-01-08 MED ORDER — ALPRAZOLAM 0.5 MG PO TABS: 0.5000 mg | ORAL_TABLET | Freq: Every evening | ORAL | Status: DC | PRN

## 2013-01-08 MED ORDER — LIDOCAINE HCL (CARDIAC) 20 MG/ML IV SOLN
INTRAVENOUS | Status: DC | PRN
  Administered 2013-01-08: 30 mg via INTRAVENOUS

## 2013-01-08 MED ORDER — TRAMADOL HCL 50 MG PO TABS: 50.0000 mg | ORAL_TABLET | Freq: Four times a day (QID) | ORAL | Status: DC | PRN

## 2013-01-08 MED ORDER — ACETAMINOPHEN 325 MG PO TABS: 650.0000 mg | ORAL_TABLET | ORAL | Status: DC | PRN

## 2013-01-08 MED ORDER — OXYCODONE HCL 5 MG/5ML PO SOLN: 5.0000 mg | Freq: Once | ORAL | Status: DC | PRN

## 2013-01-08 MED ORDER — OXYCODONE HCL 5 MG PO TABS: 5.0000 mg | ORAL_TABLET | Freq: Once | ORAL | Status: DC | PRN

## 2013-01-08 MED ORDER — MENTHOL 3 MG MT LOZG
1.0000 | LOZENGE | OROMUCOSAL | Status: DC | PRN
  Filled 2013-01-08: qty 9

## 2013-01-08 MED ORDER — NEOSTIGMINE METHYLSULFATE 1 MG/ML IJ SOLN
INTRAMUSCULAR | Status: DC | PRN
  Administered 2013-01-08: 5 mg via INTRAVENOUS

## 2013-01-08 MED ORDER — CEFAZOLIN SODIUM 1-5 GM-% IV SOLN
1.0000 g | Freq: Three times a day (TID) | INTRAVENOUS | Status: AC
  Administered 2013-01-08 – 2013-01-09 (×2): 1 g via INTRAVENOUS
  Filled 2013-01-08 (×2): qty 50

## 2013-01-08 SURGICAL SUPPLY — 58 items
BAG DECANTER FOR FLEXI CONT (MISCELLANEOUS) ×2 IMPLANT
BENZOIN TINCTURE PRP APPL 2/3 (GAUZE/BANDAGES/DRESSINGS) ×2 IMPLANT
BUR MATCHSTICK NEURO 3.0 LAGG (BURR) ×2 IMPLANT
CAGE SMALL 7X13X15 (Cage) ×6 IMPLANT
CANISTER SUCTION 2500CC (MISCELLANEOUS) ×2 IMPLANT
CHLORAPREP W/TINT 26ML (MISCELLANEOUS) ×2 IMPLANT
CLOTH BEACON ORANGE TIMEOUT ST (SAFETY) ×2 IMPLANT
CONT SPEC 4OZ CLIKSEAL STRL BL (MISCELLANEOUS) ×2 IMPLANT
DRAIN CHANNEL 7F 3/4 FLAT (WOUND CARE) ×2 IMPLANT
DRAPE C-ARM 42X72 X-RAY (DRAPES) ×4 IMPLANT
DRAPE EENT ADH APERT 15X15 STR (DRAPES) ×2 IMPLANT
DRAPE LAPAROTOMY 100X72 PEDS (DRAPES) ×2 IMPLANT
DRAPE MICROSCOPE ZEISS OPMI (DRAPES) ×2 IMPLANT
DRAPE POUCH INSTRU U-SHP 10X18 (DRAPES) ×2 IMPLANT
DRESSING TELFA 8X3 (GAUZE/BANDAGES/DRESSINGS) ×2 IMPLANT
DRILL BIT HELIX (BIT) ×2 IMPLANT
DRSG OPSITE 4X5.5 SM (GAUZE/BANDAGES/DRESSINGS) ×2 IMPLANT
DRSG OPSITE POSTOP 3X4 (GAUZE/BANDAGES/DRESSINGS) ×2 IMPLANT
DURAPREP 6ML APPLICATOR 50/CS (WOUND CARE) IMPLANT
ELECT COATED BLADE 2.86 ST (ELECTRODE) ×2 IMPLANT
ELECT REM PT RETURN 9FT ADLT (ELECTROSURGICAL) ×2
ELECTRODE REM PT RTRN 9FT ADLT (ELECTROSURGICAL) ×1 IMPLANT
EVACUATOR SILICONE 100CC (DRAIN) ×2 IMPLANT
GAUZE SPONGE 4X4 16PLY XRAY LF (GAUZE/BANDAGES/DRESSINGS) IMPLANT
GLOVE BIO SURGEON STRL SZ8 (GLOVE) ×2 IMPLANT
GLOVE BIOGEL PI IND STRL 7.0 (GLOVE) ×2 IMPLANT
GLOVE BIOGEL PI INDICATOR 7.0 (GLOVE) ×2
GLOVE ECLIPSE 7.5 STRL STRAW (GLOVE) ×2 IMPLANT
GLOVE INDICATOR 7.5 STRL GRN (GLOVE) ×2 IMPLANT
GLOVE SS BIOGEL STRL SZ 6.5 (GLOVE) ×2 IMPLANT
GLOVE SUPERSENSE BIOGEL SZ 6.5 (GLOVE) ×2
GOWN BRE IMP SLV AUR LG STRL (GOWN DISPOSABLE) ×4 IMPLANT
GOWN BRE IMP SLV AUR XL STRL (GOWN DISPOSABLE) ×2 IMPLANT
GOWN STRL REIN 2XL LVL4 (GOWN DISPOSABLE) IMPLANT
HEAD HALTER (SOFTGOODS) IMPLANT
HEMOSTAT POWDER KIT SURGIFOAM (HEMOSTASIS) ×2 IMPLANT
KIT BASIN OR (CUSTOM PROCEDURE TRAY) ×2 IMPLANT
KIT ROOM TURNOVER OR (KITS) ×2 IMPLANT
NEEDLE HYPO 25X1 1.5 SAFETY (NEEDLE) ×2 IMPLANT
NEEDLE SPNL 20GX3.5 QUINCKE YW (NEEDLE) ×2 IMPLANT
NS IRRIG 1000ML POUR BTL (IV SOLUTION) ×2 IMPLANT
PACK LAMINECTOMY NEURO (CUSTOM PROCEDURE TRAY) ×2 IMPLANT
PAD ARMBOARD 7.5X6 YLW CONV (MISCELLANEOUS) ×2 IMPLANT
PLATE HELIX R 58MM 3 LVL (Plate) ×2 IMPLANT
PUTTY 2.5ML ACTIFUSE ABX (Putty) ×2 IMPLANT
RUBBERBAND STERILE (MISCELLANEOUS) ×4 IMPLANT
SCREW 4.0X13 (Screw) ×8 IMPLANT
SCREW 4.0X13MM (Screw) ×8 IMPLANT
SPONGE INTESTINAL PEANUT (DISPOSABLE) ×2 IMPLANT
SPONGE SURGIFOAM ABS GEL 100 (HEMOSTASIS) ×2 IMPLANT
STRIP CLOSURE SKIN 1/2X4 (GAUZE/BANDAGES/DRESSINGS) ×2 IMPLANT
SUT VIC AB 3-0 SH 8-18 (SUTURE) ×4 IMPLANT
SYR 20ML ECCENTRIC (SYRINGE) IMPLANT
TOWEL OR 17X24 6PK STRL BLUE (TOWEL DISPOSABLE) ×2 IMPLANT
TOWEL OR 17X26 10 PK STRL BLUE (TOWEL DISPOSABLE) ×2 IMPLANT
TRAP SPECIMEN MUCOUS 40CC (MISCELLANEOUS) ×2 IMPLANT
TRAY FOLEY CATH 14FR (SET/KITS/TRAYS/PACK) ×2 IMPLANT
WATER STERILE IRR 1000ML POUR (IV SOLUTION) ×2 IMPLANT

## 2013-01-08 NOTE — Transfer of Care (Signed)
Immediate Anesthesia Transfer of Care Note  Patient: Ece Cumberland  Procedure(s) Performed: Procedure(s) with comments: Cervical three-four, four-five, five-six anterior cervical decompression with fusion plating and bonegraft (N/A) - Cervical three-four, four-five, five-six anterior cervical decompression with fusion plating and bonegraft  Patient Location: PACU  Anesthesia Type:General  Level of Consciousness: awake and sedated  Airway & Oxygen Therapy: Patient Spontanous Breathing and Patient connected to face mask oxygen  Post-op Assessment: Report given to PACU RN, Post -op Vital signs reviewed and stable, Patient moving all extremities and Patient moving all extremities X 4  Post vital signs: Reviewed and stable  Complications: No apparent anesthesia complications

## 2013-01-08 NOTE — Op Note (Signed)
01/08/2013  3:03 PM  PATIENT:  Coolidge Breeze  72 y.o. 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 Decompressive anterior cervical discectomy C3-4 C4-5 C5-6 2. Anterior cervical arthrodesis C3-4 C4-5 C5-6 utilizing 7 mm peek interbody cages packed with local autograft and morcellized allograft 3. Anterior cervical plating C3-C6 inclusive utilizing a helix plate,,  SURGEON:  Marikay Alar, MD  ASSISTANTS: Dr. Gerlene Fee   ANESTHESIA:   General  EBL: 200 ml  Total I/O In: 2000 [I.V.:2000] Out: 325 [Urine:125; Blood:200]  BLOOD ADMINISTERED:none  DRAINS: 7 flat JP   SPECIMEN:  No Specimen  INDICATION FOR PROCEDURE: This patient presented with a long history of neck pain with numbness in her hands and pain her left arm. She tried medical management without relief. She had an MRI which showed spondylosis with stenosis at C3-4 C4-5 and C5-6 with cord compression .  recommended ACDF with plating at C3-4 C4-5 and C5-6. Patient understood the risks, benefits, and alternatives and potential outcomes and wished to proceed.  PROCEDURE DETAILS: Patient was brought to the operating room placed under general endotracheal anesthesia. Patient was placed in the supine position on the operating room table. The neck was prepped with Duraprep and draped in a sterile fashion.   Three cc of local anesthesia was injected and a transverse incision was made on the right side of the neck.  Dissection was carried down thru the subcutaneous tissue and the platysma was  elevated, opened, and undermined with Metzenbaum scissors.  Dissection was then carried out thru an avascular plane leaving the sternocleidomastoid carotid artery and jugular vein laterally and the trachea and esophagus medially. The ventral aspect of the vertebral column was identified and a localizing x-ray was taken.  The C4-5 level was identified. The longus colli muscles were then elevated and the retractor was placed expose C3-4 C4-5 and C5-6. Anterior osteophytes were removed from the C3-4 and C5-6 disc spaces. The annulus was incised and the disc space entered. The exact same decompression was done at all 3 levels.  Discectomy was performed with micro-curettes and pituitary rongeurs. I then used the high-speed drill to drill the endplates down to the level of the posterior longitudinal ligament. The drill shavings were saved in a mucous trap for later arthrodesis. The operating microscope was draped and brought into the field provided additional magnification, illumination and visualization. Discectomy was continued posteriorly thru the disc space. Posterior longitudinal ligament was opened with a nerve hook, and then removed along with disc herniation and osteophytes, decompressing the spinal canal and thecal sac. We then continued to remove osteophytic overgrowth and disc material decompressing the neural foramina and exiting nerve roots bilaterally. The scope was angled up and down to help decompress and undercut the vertebral bodies. Once the decompression was completed we could pass a nerve hook circumferentially to assure adequate decompression in the midline and in the neural foramina at all 3 levels. So by both visualization and palpation we felt we had an adequate decompression of the neural elements. We then measured the height of the intravertebral disc space and selected a 7 millimeter Peek interbody cage packed with autograft and morcellized allograft. It was then gently positioned in the intravertebral disc space at each level and countersunk. I then used a helix plate and placed  variable angle screws into the vertebral bodies of C3-C6 inclusive and locked them into position.  The wound was irrigated with bacitracin solution, checked for hemostasis which was established and confirmed. Once meticulous hemostasis  was achieved, I placed a 7 flat JP drain and we then proceeded with closure. The platysma was closed with interrupted 3-0 undyed Vicryl suture, the subcuticular layer was closed with interrupted 3-0 undyed Vicryl suture. The skin edges were approximated with steristrips. The drapes were removed. A sterile dressing was applied. The patient was then awakened from general anesthesia and transferred to the recovery room in stable condition. At the end of the procedure all sponge, needle and instrument counts were correct.   PLAN OF CARE: Admit to inpatient   PATIENT DISPOSITION:  PACU - hemodynamically stable.   Delay start of Pharmacological VTE agent (>24hrs) due to surgical blood loss or risk of bleeding:  yes

## 2013-01-08 NOTE — Anesthesia Procedure Notes (Signed)
Procedure Name: Intubation Date/Time: 01/08/2013 11:59 AM Performed by: Coralee Rud Pre-anesthesia Checklist: Patient identified, Emergency Drugs available, Suction available and Patient being monitored Patient Re-evaluated:Patient Re-evaluated prior to inductionOxygen Delivery Method: Circle system utilized Preoxygenation: Pre-oxygenation with 100% oxygen Intubation Type: IV induction Ventilation: Mask ventilation without difficulty Laryngoscope Size: Miller and 3 Grade View: Grade I Tube type: Oral Tube size: 7.5 mm Number of attempts: 1 Airway Equipment and Method: Stylet Placement Confirmation: ETT inserted through vocal cords under direct vision,  positive ETCO2 and breath sounds checked- equal and bilateral Secured at: 21 cm Tube secured with: Tape Dental Injury: Teeth and Oropharynx as per pre-operative assessment

## 2013-01-08 NOTE — H&P (Signed)
Subjective:   Patient is a 72 y.o. 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 The patient first presented to me with complaints of neck pain. Onset of symptoms was several years ago. The pain is described as aching and occurs all day. The pain is rated moderate, and is located   Across the neck and radiates to the left arm numbness and tingling in the hands. The symptoms have been progressive. Symptoms are exacerbated by extending head backwards, and are relieved by rest.  Previous work up includes MRI of cervical spine, results: spinal stenosis.  Past Medical History  Diagnosis Date  . Diabetes mellitus   . Hypertension   . Anxiety   . Aortic valve insufficiency     mild  . Hyperlipemia   . Hypothyroidism   . Elevated liver function tests     fatty liver per CT 2009  . Diverticulosis of colon   . Osteoarthritis   . Cervical stenosis of spine     sees neuro surgery and dr.ramons s/p local injection 4/09  . Breast cancer 04/2007    s/p XRT (last 4/09)  . Urticaria 06/2009  . 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 Adenomatous polyps 12/28/1998  . Hemorrhoids   . Restless leg syndrome   . Difficulty in urination     pt uses cath at times  . Chronic constipation   . Full body hives     if patient does not take Fexofenadine    Past Surgical History  Procedure Laterality Date  . Breast lumpectomy  2009    left breast  . Appendectomy    . Cataract extraction, bilateral  2/11  . Sinus cyst removed    . Eye surgery    . Cardiac catheterization  2001    Allergies  Allergen Reactions  . Famotidine     unknown  . Iodine     unknwon  . Nsaids     REACTION: hallucinations    History  Substance Use Topics  . Smoking status: Never Smoker   . Smokeless tobacco: Not on file  . Alcohol Use: Yes     Comment: rare    Family History  Problem Relation Age of Onset  . Coronary artery disease Neg Hx   .  Hypertension Mother   . Diabetes Neg Hx   . Stroke Neg Hx   . Colon cancer Neg Hx   . Breast cancer Neg Hx   . Lung cancer Mother   . Tuberculosis Father     died at 31   Prior to Admission medications   Medication Sig Start Date End Date Taking? Authorizing Provider  baclofen (LIORESAL) 10 MG tablet Take 5 mg by mouth daily as needed. For leg cramps   Yes Historical Provider, MD  carbidopa-levodopa (SINEMET IR) 25-100 MG per tablet  12/16/12  Yes Wanda Plump, MD  fexofenadine (ALLEGRA) 180 MG tablet Take 180 mg by mouth daily.   Yes Historical Provider, MD  Insulin Glargine (LANTUS SOLOSTAR) 100 UNIT/ML SOPN Inject 27 Units as directed at bedtime.  10/10/12  Yes Wanda Plump, MD  levothyroxine (SYNTHROID, LEVOTHROID) 125 MCG tablet Take 125 mcg by mouth daily.     Yes Historical Provider, MD  lidocaine (LIDODERM) 5 % Place 1 patch onto the skin daily as needed. Remove & Discard patch within 12 hours or as directed by MD. For pain   Yes Historical Provider,  MD  lisinopril (PRINIVIL,ZESTRIL) 5 MG tablet Take 5 mg by mouth daily.    Yes Historical Provider, MD  Melatonin 10 MG CAPS Take 1 tablet by mouth every other day.   Yes Historical Provider, MD  meloxicam (MOBIC) 7.5 MG tablet Take 7.5 mg by mouth daily as needed for pain. 11/14/10  Yes Wanda Plump, MD  metFORMIN (GLUCOPHAGE) 500 MG tablet Take 500 mg by mouth 3 (three) times daily.     Yes Historical Provider, MD  OVER THE COUNTER MEDICATION Take 1 capsule by mouth daily. Milk thristil   Yes Historical Provider, MD  traMADol (ULTRAM) 50 MG tablet TAKE 1 TABLET BY MOUTH 4 TIMES A DAY AS NEEDED FOR PAIN 12/13/10  Yes Wanda Plump, MD  triamterene-hydrochlorothiazide (MAXZIDE) 75-50 MG per tablet Take 0.5 tablets by mouth daily.     Yes Historical Provider, MD  zolpidem (AMBIEN) 10 MG tablet Take 10 mg by mouth at bedtime as needed for sleep. 08/12/12  Yes Wanda Plump, MD  ALPRAZolam Prudy Feeler) 0.5 MG tablet Take 0.5 mg by mouth at bedtime as needed for  anxiety.    Historical Provider, MD  glucose blood (ONE TOUCH TEST STRIPS) test strip Check once daily. 11/15/11   Wanda Plump, MD  [facility] DELICA LANCETS MISC Check once daily. 11/15/11   Wanda Plump, MD     Review of Systems  Positive ROS: neg  All other systems have been reviewed and were otherwise negative with the exception of those mentioned in the HPI and as above.  Objective: Vital signs in last 24 hours: Temp:  [97.7 F (36.5 C)] 97.7 F (36.5 C) (08/21 0900) Pulse Rate:  [78] 78 (08/21 0900) Resp:  [18] 18 (08/21 0900) BP: (156)/(65) 156/65 mmHg (08/21 0900) SpO2:  [95 %] 95 % (08/21 0900)  General Appearance: Alert, cooperative, no distress, appears stated age Head: Normocephalic, without obvious abnormality, atraumatic Eyes: PERRL, conjunctiva/corneas clear, EOM's intact      Neck: Supple, symmetrical, trachea midline, Back: Symmetric, no curvature, ROM normal, no CVA tenderness Lungs:  respirations unlabored Heart: Regular rate and rhythm Abdomen: Soft, non-tender Extremities: Extremities normal, atraumatic, no cyanosis or edema Pulses: 2+ and symmetric all extremities Skin: Skin color, texture, turgor normal, no rashes or lesions  NEUROLOGIC:  Mental status: Alert and oriented x4, no aphasia, good attention span, fund of knowledge and memory  Motor Exam - grossly normal Sensory Exam - grossly normal Reflexes: 1+ Coordination - grossly normal Gait - grossly normal Balance - grossly normal Cranial Nerves: I: smell Not tested  II: visual acuity  OS: nl    OD: nl  II: visual fields Full to confrontation  II: pupils Equal, round, reactive to light  III,VII: ptosis None  III,IV,VI: extraocular muscles  Full ROM  V: mastication Normal  V: facial light touch sensation  Normal  V,VII: corneal reflex  Present  VII: facial muscle function - upper  Normal  VII: facial muscle function - lower Normal  VIII: hearing Not tested  IX: soft palate elevation  Normal   IX,X: gag reflex Present  XI: trapezius strength  5/5  XI: sternocleidomastoid strength 5/5  XI: neck flexion strength  5/5  XII: tongue strength  Normal    Data Review Lab Results  Component Value Date   WBC 7.1 01/01/2013   HGB 13.6 01/01/2013   HCT 37.7 01/01/2013   MCV 90.4 01/01/2013   PLT 168 01/01/2013   Lab Results  Component Value Date  NA 138 01/01/2013   K 4.2 01/01/2013   CL 101 01/01/2013   CO2 25 01/01/2013   BUN 19 01/01/2013   CREATININE 0.65 01/01/2013   GLUCOSE 183* 01/01/2013   Lab Results  Component Value Date   INR 1.15 01/08/2013    Assessment:   Cervical neck pain with herniated nucleus pulposus/ spondylosis/ stenosis at C3-4 C4-5 and C5-6. Patient has failed conservative therapy. Planned surgery : ACDF C3-4 C4-5 C5-6  Plan:   I explained the condition and procedure to the patient and answered any questions.  Patient wishes to proceed with procedure as planned. Understands risks/ benefits/ and expected or typical outcomes.  Michele Kerlin S 01/08/2013 11:32 AM

## 2013-01-08 NOTE — Anesthesia Preprocedure Evaluation (Addendum)
Anesthesia Evaluation  Patient identified by MRN, date of birth, ID band Patient awake    Reviewed: Allergy & Precautions, H&P , NPO status , Patient's Chart, lab work & pertinent test results  History of Anesthesia Complications Negative for: history of anesthetic complications  Airway Mallampati: II TM Distance: >3 FB Neck ROM: Full    Dental  (+) Teeth Intact, Dental Advisory Given and Missing   Pulmonary neg pulmonary ROS,  breath sounds clear to auscultation  Pulmonary exam normal       Cardiovascular hypertension, Pt. on medications Rhythm:Regular Rate:Normal  '11 ECHO: normal LVF, EF 60-65%, grade 1 diastolic dysfunction, valves OK '11 Stress test: no ischemia, normal perfusion, EF 78% '02 cath: no coronary flow limitations   Neuro/Psych  Headaches, Anxiety Depression Chronic back pain: tramadol    GI/Hepatic Neg liver ROS, GERD-  Controlled,  Endo/Other  diabetes (glu 141), Well Controlled, Type 2, Oral Hypoglycemic AgentsHypothyroidism   Renal/GU negative Renal ROS     Musculoskeletal   Abdominal (+) + obese,   Peds  Hematology   Anesthesia Other Findings   Reproductive/Obstetrics                          Anesthesia Physical Anesthesia Plan  ASA: III  Anesthesia Plan: General   Post-op Pain Management:    Induction: Intravenous  Airway Management Planned: Oral ETT  Additional Equipment:   Intra-op Plan:   Post-operative Plan: Extubation in OR  Informed Consent: I have reviewed the patients History and Physical, chart, labs and discussed the procedure including the risks, benefits and alternatives for the proposed anesthesia with the patient or authorized representative who has indicated his/her understanding and acceptance.   Dental advisory given  Plan Discussed with: CRNA and Surgeon  Anesthesia Plan Comments: (Plan routine monitors, GETA)        Anesthesia  Quick Evaluation

## 2013-01-08 NOTE — Progress Notes (Signed)
UR COMPLETED  

## 2013-01-08 NOTE — Anesthesia Postprocedure Evaluation (Signed)
  Anesthesia Post-op Note  Patient: Sharon Armstrong  Procedure(s) Performed: Procedure(s) with comments: Cervical three-four, four-five, five-six anterior cervical decompression with fusion plating and bonegraft (N/A) - Cervical three-four, four-five, five-six anterior cervical decompression with fusion plating and bonegraft  Patient Location: PACU  Anesthesia Type:General  Level of Consciousness: awake, alert , oriented and patient cooperative  Airway and Oxygen Therapy: Patient Spontanous Breathing and Patient connected to nasal cannula oxygen  Post-op Pain: mild  Post-op Assessment: Post-op Vital signs reviewed, Patient's Cardiovascular Status Stable, Respiratory Function Stable, Patent Airway, No signs of Nausea or vomiting and Pain level controlled  Post-op Vital Signs: Reviewed and stable  Complications: No apparent anesthesia complications

## 2013-01-09 LAB — GLUCOSE, CAPILLARY: Glucose-Capillary: 180 mg/dL — ABNORMAL HIGH (ref 70–99)

## 2013-01-09 MED ORDER — OXYCODONE-ACETAMINOPHEN 5-325 MG PO TABS: 1.0000 | ORAL_TABLET | Freq: Four times a day (QID) | ORAL | Status: DC | PRN

## 2013-01-09 NOTE — Progress Notes (Signed)
Pt given D/C instructions with Rx, verbal understanding given. Pt D/C'd home via wheelchair @ 1230 per MD order. Rema Fendt, RN

## 2013-01-09 NOTE — Plan of Care (Signed)
Problem: Consults Goal: Diagnosis - Spinal Surgery Outcome: Completed/Met Date Met:  01/09/13 Cervical Spine Fusion     

## 2013-01-09 NOTE — Plan of Care (Signed)
Problem: Consults Goal: Diagnosis - Spinal Surgery Cervical Spine Fusion     

## 2013-01-09 NOTE — Discharge Summary (Signed)
Physician Discharge Summary  Patient ID: Sharon Armstrong MRN: [id number] DOB/AGE: 10/09/1940 72 y.o.  Admit date: 01/08/2013 Discharge date: 01/09/2013  Admission Diagnoses: cervical stenosis    Discharge Diagnoses: same   Discharged Condition: good  Hospital Course: The patient was admitted on 01/08/2013 and taken to the operating room where the patient underwent ACDF C3-4 C4-5 C5-6. The patient tolerated the procedure well and was taken to the recovery room and then to the floor in stable condition. The hospital course was routine. There were no complications. The wound remained clean dry and intact. Pt had appropriate neck soreness. No complaints of arm pain or new N/T/W. The patient remained afebrile with stable vital signs, and tolerated a regular diet. The patient continued to increase activities, and pain was well controlled with oral pain medications. Her drain was removed prior to discharge.  Consults: None  Significant Diagnostic Studies:  Results for orders placed during the hospital encounter of 01/08/13  PROTIME-INR      Result Value Range   Prothrombin Time 14.5  11.6 - 15.2 seconds   INR 1.15  0.00 - 1.49  APTT      Result Value Range   aPTT 30  24 - 37 seconds  GLUCOSE, CAPILLARY      Result Value Range   Glucose-Capillary 141 (*) 70 - 99 mg/dL  GLUCOSE, CAPILLARY      Result Value Range   Glucose-Capillary 115 (*) 70 - 99 mg/dL  GLUCOSE, CAPILLARY      Result Value Range   Glucose-Capillary 137 (*) 70 - 99 mg/dL   Comment 1 Notify RN     Comment 2 Documented in Chart    GLUCOSE, CAPILLARY      Result Value Range   Glucose-Capillary 164 (*) 70 - 99 mg/dL  NO BLOOD PRODUCTS      Result Value Range   Transfuse no blood products       Value: TRANSFUSE NO BLOOD PRODUCTS, VERIFIED BY DALTMAN RN    Dg Chest 2 View  01/01/2013   *RADIOLOGY REPORT*  Clinical Data: Preop for cervical spine surgery  CHEST - 2 VIEW  Comparison: 4/18/ 11  Findings:  Cardiomediastinal silhouette is stable.  Surgical clips in the left axilla.  No acute infiltrate or pleural effusion.  No pulmonary edema.  Bony thorax is unremarkable.  IMPRESSION: No active disease.   Original Report Authenticated By: Natasha Mead, M.D.   Dg Cervical Spine 1 View  01/08/2013   *RADIOLOGY REPORT*  Clinical Data: Cervical fusion  DG C-ARM 1-60 MIN,DG CERVICAL SPINE - 1 VIEW  Comparison: MR 12/01/2012  Findings: Single lateral intraoperative fluoroscopic spot image documents changes of anterior instrumented fusion C3-C6, with graft in the intervening interspaces. Hardware projects in expected location.  Alignment preserved.  Cervicothoracic junction not well visualized.  IMPRESSION:  ACDF C3-C6.   Original Report Authenticated By: D. Andria Rhein, MD   Dg C-arm 1-60 Min  01/08/2013   *RADIOLOGY REPORT*  Clinical Data: Cervical fusion  DG C-ARM 1-60 MIN,DG CERVICAL SPINE - 1 VIEW  Comparison: MR 12/01/2012  Findings: Single lateral intraoperative fluoroscopic spot image documents changes of anterior instrumented fusion C3-C6, with graft in the intervening interspaces. Hardware projects in expected location.  Alignment preserved.  Cervicothoracic junction not well visualized.  IMPRESSION:  ACDF C3-C6.   Original Report Authenticated By: D. Andria Rhein, MD    Antibiotics:  Anti-infectives   Start     Dose/Rate Route Frequency Ordered Stop   01/08/13 1645  ceFAZolin (ANCEF) IVPB 1 g/50 mL premix     1 g 100 mL/hr over 30 Minutes Intravenous Every 8 hours 01/08/13 1628 01/09/13 0033   01/08/13 1233  bacitracin 50,000 Units in sodium chloride irrigation 0.9 % 500 mL irrigation  Status:  Discontinued       As needed 01/08/13 1233 01/08/13 1456   01/08/13 0600  ceFAZolin (ANCEF) IVPB 2 g/50 mL premix     2 g 100 mL/hr over 30 Minutes Intravenous On call to O.R. 01/07/13 1254 01/08/13 1205      Discharge Exam: Blood pressure 154/52, pulse 72, temperature 98.9 F (37.2 C), temperature  source Oral, resp. rate 18, SpO2 99.00%. Neurologic: Grossly normal Incision CDI  Discharge Medications:     Medication List    STOP taking these medications       meloxicam 7.5 MG tablet  Commonly known as:  MOBIC      TAKE these medications       ALPRAZolam 0.5 MG tablet  Commonly known as:  XANAX  Take 0.5 mg by mouth at bedtime as needed for anxiety.     baclofen 10 MG tablet  Commonly known as:  LIORESAL  Take 5 mg by mouth daily as needed. For leg cramps     carbidopa-levodopa 25-100 MG per tablet  Commonly known as:  SINEMET IR     fexofenadine 180 MG tablet  Commonly known as:  ALLEGRA  Take 180 mg by mouth daily.     glucose blood test strip  Commonly known as:  ONE TOUCH TEST STRIPS  Check once daily.     LANTUS SOLOSTAR 100 UNIT/ML Sopn  Generic drug:  Insulin Glargine  Inject 27 Units as directed at bedtime.     levothyroxine 125 MCG tablet  Commonly known as:  SYNTHROID, LEVOTHROID  Take 125 mcg by mouth daily.     lidocaine 5 %  Commonly known as:  LIDODERM  Place 1 patch onto the skin daily as needed. Remove & Discard patch within 12 hours or as directed by MD. For pain     lisinopril 5 MG tablet  Commonly known as:  PRINIVIL,ZESTRIL  Take 5 mg by mouth daily.     Melatonin 10 MG Caps  Take 1 tablet by mouth every other day.     metFORMIN 500 MG tablet  Commonly known as:  GLUCOPHAGE  Take 500 mg by mouth 3 (three) times daily.     ONETOUCH DELICA LANCETS Misc  Check once daily.     OVER THE COUNTER MEDICATION  Take 1 capsule by mouth daily. Milk thristil     oxyCODONE-acetaminophen 5-325 MG per tablet  Commonly known as:  PERCOCET/ROXICET  Take 1-2 tablets by mouth every 6 (six) hours as needed for pain.     traMADol 50 MG tablet  Commonly known as:  ULTRAM  TAKE 1 TABLET BY MOUTH 4 TIMES A DAY AS NEEDED FOR PAIN     triamterene-hydrochlorothiazide 75-50 MG per tablet  Commonly known as:  MAXZIDE  Take 0.5 tablets by mouth  daily.     zolpidem 10 MG tablet  Commonly known as:  AMBIEN  Take 10 mg by mouth at bedtime as needed for sleep.        Disposition: home   Final Dx: ACDF C3-4 C4-5 C5-6      Discharge Orders   Future Orders Complete By Expires   Call MD for:  difficulty breathing, headache or visual disturbances  As directed  Call MD for:  persistant nausea and vomiting  As directed    Call MD for:  redness, tenderness, or signs of infection (pain, swelling, redness, odor or green/yellow discharge around incision site)  As directed    Call MD for:  severe uncontrolled pain  As directed    Call MD for:  temperature >100.4  As directed    Diet - low sodium heart healthy  As directed    Discharge instructions  As directed    Comments:     No driving, no lifting more than 8 lbs, may shower   Increase activity slowly  As directed       Follow-up Information   Follow up with Theon Sobotka S, MD. Schedule an appointment as soon as possible for a visit in 2 weeks.   Specialty:  Neurosurgery   Contact information:   [address] [place] [address] [phone]        Signed: Tia Alert 01/09/2013, 7:45 AM

## 2013-01-12 ENCOUNTER — Encounter (HOSPITAL_COMMUNITY): Payer: Self-pay | Admitting: Neurological Surgery

## 2013-02-23 DIAGNOSIS — M47812 Spondylosis without myelopathy or radiculopathy, cervical region: Secondary | ICD-10-CM | POA: Diagnosis not present

## 2013-03-05 DIAGNOSIS — Z23 Encounter for immunization: Secondary | ICD-10-CM | POA: Diagnosis not present

## 2013-03-18 ENCOUNTER — Other Ambulatory Visit: Payer: Self-pay | Admitting: Internal Medicine

## 2013-03-19 ENCOUNTER — Telehealth: Payer: Self-pay | Admitting: *Deleted

## 2013-03-19 MED ORDER — ZOLPIDEM TARTRATE 10 MG PO TABS: 10.0000 mg | ORAL_TABLET | Freq: Every evening | ORAL | Status: DC | PRN

## 2013-03-19 NOTE — Telephone Encounter (Signed)
rx refill request- ambien 10 mg  Last ov- 11/27/12 Last refilled- 08/12/12 UDS low - 03/ 28/14  Please advise- DJR

## 2013-03-19 NOTE — Telephone Encounter (Signed)
Done

## 2013-03-19 NOTE — Telephone Encounter (Deleted)
rx refill request- ambien 10 mg  Last ov- 11/27/12 Last refilled- 08/12/12 UDS low - 03/ 28/14  Please advise- DJR  

## 2013-03-19 NOTE — Addendum Note (Signed)
Addended by: Willow Ora E on: 03/19/2013 01:30 PM   Modules accepted: Orders

## 2013-03-26 ENCOUNTER — Other Ambulatory Visit: Payer: Self-pay

## 2013-03-26 ENCOUNTER — Other Ambulatory Visit: Payer: Self-pay | Admitting: Internal Medicine

## 2013-03-31 ENCOUNTER — Other Ambulatory Visit: Payer: Self-pay | Admitting: Internal Medicine

## 2013-04-01 ENCOUNTER — Telehealth: Payer: Self-pay | Admitting: *Deleted

## 2013-04-01 NOTE — Telephone Encounter (Signed)
error 

## 2013-04-13 DIAGNOSIS — IMO0002 Reserved for concepts with insufficient information to code with codable children: Secondary | ICD-10-CM | POA: Diagnosis not present

## 2013-04-13 DIAGNOSIS — M545 Low back pain, unspecified: Secondary | ICD-10-CM | POA: Diagnosis not present

## 2013-04-14 DIAGNOSIS — C50919 Malignant neoplasm of unspecified site of unspecified female breast: Secondary | ICD-10-CM | POA: Diagnosis not present

## 2013-04-14 DIAGNOSIS — M545 Low back pain, unspecified: Secondary | ICD-10-CM | POA: Diagnosis not present

## 2013-04-14 DIAGNOSIS — IMO0002 Reserved for concepts with insufficient information to code with codable children: Secondary | ICD-10-CM | POA: Diagnosis not present

## 2013-04-14 DIAGNOSIS — M549 Dorsalgia, unspecified: Secondary | ICD-10-CM | POA: Diagnosis not present

## 2013-04-14 DIAGNOSIS — M5137 Other intervertebral disc degeneration, lumbosacral region: Secondary | ICD-10-CM | POA: Diagnosis not present

## 2013-05-01 DIAGNOSIS — M545 Low back pain, unspecified: Secondary | ICD-10-CM | POA: Diagnosis not present

## 2013-05-01 DIAGNOSIS — IMO0002 Reserved for concepts with insufficient information to code with codable children: Secondary | ICD-10-CM | POA: Diagnosis not present

## 2013-05-04 DIAGNOSIS — Z79899 Other long term (current) drug therapy: Secondary | ICD-10-CM | POA: Diagnosis not present

## 2013-05-27 ENCOUNTER — Ambulatory Visit: Payer: Medicare Other | Admitting: Physician Assistant

## 2013-05-27 DIAGNOSIS — R300 Dysuria: Secondary | ICD-10-CM | POA: Diagnosis not present

## 2013-06-01 ENCOUNTER — Telehealth: Payer: Self-pay | Admitting: Internal Medicine

## 2013-06-01 NOTE — Telephone Encounter (Signed)
Patient needs a refill on Insulin Glargine (LANTUS SOLOSTAR) 100 UNIT/ML SOPN sent to [facility] in [place]. Please advise.  Patient also wants to know if she can come in to have her next Prolia injection.

## 2013-06-03 MED ORDER — INSULIN GLARGINE 100 UNIT/ML SOLOSTAR PEN: 27.0000 [IU] | PEN_INJECTOR | Freq: Every day | SUBCUTANEOUS | Status: DC

## 2013-06-03 NOTE — Telephone Encounter (Signed)
Done

## 2013-06-09 ENCOUNTER — Ambulatory Visit: Payer: Medicare Other | Admitting: Internal Medicine

## 2013-06-16 ENCOUNTER — Ambulatory Visit (INDEPENDENT_AMBULATORY_CARE_PROVIDER_SITE_OTHER): Payer: Medicare Other | Admitting: Internal Medicine

## 2013-06-16 ENCOUNTER — Encounter: Payer: Self-pay | Admitting: Internal Medicine

## 2013-06-16 VITALS — BP 178/77 | HR 90 | Temp 98.0°F | Wt 169.0 lb

## 2013-06-16 DIAGNOSIS — E785 Hyperlipidemia, unspecified: Secondary | ICD-10-CM

## 2013-06-16 DIAGNOSIS — E039 Hypothyroidism, unspecified: Secondary | ICD-10-CM | POA: Diagnosis not present

## 2013-06-16 DIAGNOSIS — I10 Essential (primary) hypertension: Secondary | ICD-10-CM

## 2013-06-16 DIAGNOSIS — M948X9 Other specified disorders of cartilage, unspecified sites: Secondary | ICD-10-CM | POA: Diagnosis not present

## 2013-06-16 DIAGNOSIS — G4700 Insomnia, unspecified: Secondary | ICD-10-CM | POA: Insufficient documentation

## 2013-06-16 DIAGNOSIS — G2581 Restless legs syndrome: Secondary | ICD-10-CM

## 2013-06-16 DIAGNOSIS — E119 Type 2 diabetes mellitus without complications: Secondary | ICD-10-CM | POA: Diagnosis not present

## 2013-06-16 DIAGNOSIS — F411 Generalized anxiety disorder: Secondary | ICD-10-CM

## 2013-06-16 MED ORDER — DENOSUMAB 60 MG/ML ~~LOC~~ SOLN
60.0000 mg | Freq: Once | SUBCUTANEOUS | Status: AC
  Administered 2013-06-16: 60 mg via SUBCUTANEOUS

## 2013-06-16 MED ORDER — ZOSTER VACCINE LIVE 19400 UNT/0.65ML ~~LOC~~ SOLR
0.6500 mL | Freq: Once | SUBCUTANEOUS | Status: DC
Start: 2013-06-16 — End: 2013-11-03

## 2013-06-16 MED ORDER — METFORMIN HCL 500 MG PO TABS: 500.0000 mg | ORAL_TABLET | Freq: Three times a day (TID) | ORAL | Status: DC

## 2013-06-16 MED ORDER — CARBIDOPA-LEVODOPA 25-100 MG PO TABS: ORAL_TABLET | ORAL | Status: DC

## 2013-06-16 MED ORDER — LISINOPRIL 10 MG PO TABS: 10.0000 mg | ORAL_TABLET | Freq: Every day | ORAL | Status: DC

## 2013-06-16 MED ORDER — LEVOTHYROXINE SODIUM 125 MCG PO TABS: 125.0000 ug | ORAL_TABLET | Freq: Every day | ORAL | Status: DC

## 2013-06-16 NOTE — Assessment & Plan Note (Signed)
Comparison medications, check a TSH

## 2013-06-16 NOTE — Assessment & Plan Note (Signed)
On Ambien, 10 mg half tablet daily

## 2013-06-16 NOTE — Patient Instructions (Signed)
Get your blood work before you leave   Increase lisinopril from 5 mg to 10 mg daily Check the  blood pressure 2 or 3 times a month   be sure it is between 110/60 and 140/85. Ideal blood pressure is 120/80. If it is consistently higher or lower, let me know  Please see Dr. Danise Mina in 4 weeks, your cholesterol , blood pressure and a BMP need  to be checked

## 2013-06-16 NOTE — Assessment & Plan Note (Signed)
Having some family issues, quite distressed and emotional today. She has Xanax but takes it very rarely. Declined SSRIs or similar medications today

## 2013-06-16 NOTE — Assessment & Plan Note (Signed)
Patient is not fasting today, recommend to discuss with Dr. Danise Mina

## 2013-06-16 NOTE — Assessment & Plan Note (Signed)
Refill Sinemet

## 2013-06-16 NOTE — Assessment & Plan Note (Signed)
On Lantus 27 units daily and metformin 500 mg 3 times a day CBGs in the morning range from 126 to 221, in the afternoon usually 159 pre-prandial. Plan: A1c Further advice would results

## 2013-06-16 NOTE — Assessment & Plan Note (Addendum)
BP today slightly elevated at 178/77, ambulatory BPs 143/60. She is quite emotional today and under stress.  Recommend  Increase lisinopril from 5 mg to 10 mg, monitor BPs, BMP today. She is going to [facility] soon, recommend to see PCP over there, Dr. Danise Mina for followup

## 2013-06-16 NOTE — Progress Notes (Signed)
   Subjective:    Patient ID: Sharon Armstrong, female    DOB: 04-29-1941, 73 y.o.   MRN: [id number]  HPI Followup visit, we discussed several issues: Osteoporosis-- on prolia Diabetes, good medication compliance, ambulatory blood sugars have been increased, see assessment and plan. Hypertension--good medication compliance, BP today slightly elevated, ambulatory BPs 143/60 RLS, needs a refill on carbidopa levodopa Synthroid--good medication compliance.   Past Medical History: Diabetes mellitus, type II HTN Anxiety Mild aortic valve insufficiency Hyperlipidemia hypothyroidism   elevated LFTS , Fatty liver per CT 2009 Diverticulosis, colon OA cervical spine stenosis   Breast cancer, hx of (DX 12- 2008) - S/P XRT (last 4-09) Urticaria onset 2/ 2011 Positive PPD, hx of, never treated (father died TB)  Past Surgical History: Lumpectomy (2009) - left breast Appendectomy bilateral cataract surgery 2/11 Colon polyps Sinus cyst removed  Social History: Married, lives in [place] half of the time, 2 kids Never Smoked Alcohol use-no   Review of Systems Denies chest pain or shortness or breath No nausea, vomiting, diarrhea. + + Stress, having some family issues. Recently treated at the urgent care   for a UTI, status post Bactrim and nitrofurantoin, improving    Objective:   Physical Exam BP 178/77  Pulse 90  Temp(Src) 98 F (36.7 C)  Wt 169 lb (76.658 kg)  SpO2 97% General -- alert, well-developed, NAD.   Lungs -- normal respiratory effort, no intercostal retractions, no accessory muscle use, and normal breath sounds.  Heart-- normal rate, regular rhythm, no murmur.   Extremities-- no pretibial edema bilaterally  Psych-- Cognition and judgment appear intact. Cooperative with normal attention span and concentration. Very emotional, tearful .      Assessment & Plan:  zostavax-- rx printed at pt request

## 2013-06-16 NOTE — Progress Notes (Signed)
Pre visit review using our clinic review tool, if applicable. No additional management support is needed unless otherwise documented below in the visit note. 

## 2013-06-17 ENCOUNTER — Encounter: Payer: Self-pay | Admitting: Internal Medicine

## 2013-06-17 DIAGNOSIS — Z79899 Other long term (current) drug therapy: Secondary | ICD-10-CM | POA: Diagnosis not present

## 2013-06-17 LAB — BASIC METABOLIC PANEL
BUN: 19 mg/dL (ref 6–23)
CHLORIDE: 103 meq/L (ref 96–112)
CO2: 27 mEq/L (ref 19–32)
CREATININE: 0.8 mg/dL (ref 0.4–1.2)
Calcium: 9.8 mg/dL (ref 8.4–10.5)
GFR: 79.48 mL/min (ref >60.00)
GLUCOSE: 250 mg/dL — AB (ref 70–99)
POTASSIUM: 4.1 meq/L (ref 3.5–5.1)
Sodium: 138 mEq/L (ref 135–145)

## 2013-06-17 LAB — TSH: TSH: 0.58 u[IU]/mL (ref 0.35–5.50)

## 2013-06-17 LAB — HEMOGLOBIN A1C: Hgb A1c MFr Bld: 8.3 % — ABNORMAL HIGH (ref 4.6–6.5)

## 2013-06-17 LAB — AST: AST: 66 U/L — ABNORMAL HIGH (ref 0–37)

## 2013-06-17 LAB — ALT: ALT: 72 U/L — ABNORMAL HIGH (ref 0–35)

## 2013-06-19 ENCOUNTER — Telehealth: Payer: Self-pay

## 2013-06-19 NOTE — Telephone Encounter (Signed)
Relevant patient education assigned to patient using Emmi. ° °

## 2013-06-24 ENCOUNTER — Telehealth: Payer: Self-pay

## 2013-06-24 ENCOUNTER — Telehealth: Payer: Self-pay | Admitting: Internal Medicine

## 2013-06-24 NOTE — Telephone Encounter (Signed)
Relevant patient education assigned to patient using Emmi. ° °

## 2013-06-24 NOTE — Telephone Encounter (Signed)
UDS: 06/17/2013 Neg for Gabapentin Positive for Ambien and Tramadol Low risk per Dr Larose Kells

## 2013-07-10 DIAGNOSIS — I10 Essential (primary) hypertension: Secondary | ICD-10-CM | POA: Diagnosis not present

## 2013-07-10 DIAGNOSIS — F411 Generalized anxiety disorder: Secondary | ICD-10-CM | POA: Diagnosis not present

## 2013-07-10 DIAGNOSIS — E119 Type 2 diabetes mellitus without complications: Secondary | ICD-10-CM | POA: Diagnosis not present

## 2013-07-10 DIAGNOSIS — G4700 Insomnia, unspecified: Secondary | ICD-10-CM | POA: Diagnosis not present

## 2013-07-27 DIAGNOSIS — F411 Generalized anxiety disorder: Secondary | ICD-10-CM | POA: Diagnosis not present

## 2013-07-27 DIAGNOSIS — E119 Type 2 diabetes mellitus without complications: Secondary | ICD-10-CM | POA: Diagnosis not present

## 2013-07-27 DIAGNOSIS — E039 Hypothyroidism, unspecified: Secondary | ICD-10-CM | POA: Diagnosis not present

## 2013-07-27 DIAGNOSIS — I10 Essential (primary) hypertension: Secondary | ICD-10-CM | POA: Diagnosis not present

## 2013-07-27 LAB — HEPATIC FUNCTION PANEL: BILIRUBIN, TOTAL: 0.6 mg/dL

## 2013-07-29 DIAGNOSIS — Z853 Personal history of malignant neoplasm of breast: Secondary | ICD-10-CM | POA: Diagnosis not present

## 2013-07-29 DIAGNOSIS — C50119 Malignant neoplasm of central portion of unspecified female breast: Secondary | ICD-10-CM | POA: Diagnosis not present

## 2013-07-31 DIAGNOSIS — E119 Type 2 diabetes mellitus without complications: Secondary | ICD-10-CM | POA: Diagnosis not present

## 2013-07-31 DIAGNOSIS — F411 Generalized anxiety disorder: Secondary | ICD-10-CM | POA: Diagnosis not present

## 2013-07-31 DIAGNOSIS — E039 Hypothyroidism, unspecified: Secondary | ICD-10-CM | POA: Diagnosis not present

## 2013-07-31 DIAGNOSIS — I10 Essential (primary) hypertension: Secondary | ICD-10-CM | POA: Diagnosis not present

## 2013-08-10 ENCOUNTER — Encounter: Payer: Self-pay | Admitting: Gastroenterology

## 2013-08-19 ENCOUNTER — Encounter: Payer: Self-pay | Admitting: Internal Medicine

## 2013-08-21 DIAGNOSIS — H26499 Other secondary cataract, unspecified eye: Secondary | ICD-10-CM | POA: Diagnosis not present

## 2013-09-14 DIAGNOSIS — Z6827 Body mass index (BMI) 27.0-27.9, adult: Secondary | ICD-10-CM | POA: Diagnosis not present

## 2013-09-14 DIAGNOSIS — M47812 Spondylosis without myelopathy or radiculopathy, cervical region: Secondary | ICD-10-CM | POA: Diagnosis not present

## 2013-09-14 DIAGNOSIS — M4802 Spinal stenosis, cervical region: Secondary | ICD-10-CM | POA: Diagnosis not present

## 2013-10-13 ENCOUNTER — Other Ambulatory Visit: Payer: Self-pay | Admitting: Internal Medicine

## 2013-10-14 ENCOUNTER — Telehealth: Payer: Self-pay | Admitting: *Deleted

## 2013-10-14 MED ORDER — ZOLPIDEM TARTRATE 10 MG PO TABS: 10.0000 mg | ORAL_TABLET | Freq: Every evening | ORAL | Status: DC | PRN

## 2013-10-14 NOTE — Telephone Encounter (Signed)
Done

## 2013-10-14 NOTE — Telephone Encounter (Signed)
ambien 10 mg  Last OV- 06/16/13  Last refilled- 03/19/13 # 30 / 4 rf  UDS-

## 2013-10-14 NOTE — Telephone Encounter (Signed)
rx sent to [facility].

## 2013-10-15 ENCOUNTER — Telehealth: Payer: Self-pay | Admitting: *Deleted

## 2013-10-15 ENCOUNTER — Other Ambulatory Visit: Payer: Self-pay | Admitting: Internal Medicine

## 2013-10-15 NOTE — Telephone Encounter (Signed)
error 

## 2013-10-30 ENCOUNTER — Other Ambulatory Visit: Payer: Self-pay | Admitting: Internal Medicine

## 2013-11-03 ENCOUNTER — Emergency Department (HOSPITAL_COMMUNITY): Payer: Medicare Other

## 2013-11-03 ENCOUNTER — Encounter (HOSPITAL_COMMUNITY): Payer: Self-pay | Admitting: Emergency Medicine

## 2013-11-03 ENCOUNTER — Emergency Department (HOSPITAL_COMMUNITY)
Admission: EM | Admit: 2013-11-03 | Discharge: 2013-11-03 | Disposition: A | Discharge status: Home / Self Care | Payer: Medicare Other | Attending: Emergency Medicine | Admitting: Emergency Medicine

## 2013-11-03 DIAGNOSIS — K5900 Constipation, unspecified: Secondary | ICD-10-CM | POA: Diagnosis not present

## 2013-11-03 DIAGNOSIS — R7611 Nonspecific reaction to tuberculin skin test without active tuberculosis: Secondary | ICD-10-CM | POA: Diagnosis not present

## 2013-11-03 DIAGNOSIS — S0993XA Unspecified injury of face, initial encounter: Secondary | ICD-10-CM | POA: Diagnosis not present

## 2013-11-03 DIAGNOSIS — G2581 Restless legs syndrome: Secondary | ICD-10-CM | POA: Insufficient documentation

## 2013-11-03 DIAGNOSIS — I10 Essential (primary) hypertension: Secondary | ICD-10-CM | POA: Insufficient documentation

## 2013-11-03 DIAGNOSIS — S199XXA Unspecified injury of neck, initial encounter: Secondary | ICD-10-CM | POA: Diagnosis not present

## 2013-11-03 DIAGNOSIS — R51 Headache: Secondary | ICD-10-CM | POA: Insufficient documentation

## 2013-11-03 DIAGNOSIS — E039 Hypothyroidism, unspecified: Secondary | ICD-10-CM | POA: Diagnosis not present

## 2013-11-03 DIAGNOSIS — D369 Benign neoplasm, unspecified site: Secondary | ICD-10-CM | POA: Diagnosis not present

## 2013-11-03 DIAGNOSIS — F411 Generalized anxiety disorder: Secondary | ICD-10-CM | POA: Insufficient documentation

## 2013-11-03 DIAGNOSIS — R3989 Other symptoms and signs involving the genitourinary system: Secondary | ICD-10-CM | POA: Diagnosis not present

## 2013-11-03 DIAGNOSIS — S0990XA Unspecified injury of head, initial encounter: Secondary | ICD-10-CM | POA: Diagnosis not present

## 2013-11-03 DIAGNOSIS — Z888 Allergy status to other drugs, medicaments and biological substances status: Secondary | ICD-10-CM | POA: Insufficient documentation

## 2013-11-03 DIAGNOSIS — Z794 Long term (current) use of insulin: Secondary | ICD-10-CM | POA: Diagnosis not present

## 2013-11-03 DIAGNOSIS — M1990 Unspecified osteoarthritis, unspecified site: Secondary | ICD-10-CM | POA: Insufficient documentation

## 2013-11-03 DIAGNOSIS — L508 Other urticaria: Secondary | ICD-10-CM | POA: Insufficient documentation

## 2013-11-03 DIAGNOSIS — R7989 Other specified abnormal findings of blood chemistry: Secondary | ICD-10-CM | POA: Diagnosis not present

## 2013-11-03 DIAGNOSIS — I359 Nonrheumatic aortic valve disorder, unspecified: Secondary | ICD-10-CM | POA: Diagnosis not present

## 2013-11-03 DIAGNOSIS — L509 Urticaria, unspecified: Secondary | ICD-10-CM | POA: Insufficient documentation

## 2013-11-03 DIAGNOSIS — E119 Type 2 diabetes mellitus without complications: Secondary | ICD-10-CM | POA: Insufficient documentation

## 2013-11-03 DIAGNOSIS — K5730 Diverticulosis of large intestine without perforation or abscess without bleeding: Secondary | ICD-10-CM | POA: Diagnosis not present

## 2013-11-03 DIAGNOSIS — Z79899 Other long term (current) drug therapy: Secondary | ICD-10-CM | POA: Insufficient documentation

## 2013-11-03 DIAGNOSIS — M542 Cervicalgia: Secondary | ICD-10-CM | POA: Diagnosis not present

## 2013-11-03 DIAGNOSIS — K649 Unspecified hemorrhoids: Secondary | ICD-10-CM | POA: Insufficient documentation

## 2013-11-03 DIAGNOSIS — R519 Headache, unspecified: Secondary | ICD-10-CM

## 2013-11-03 DIAGNOSIS — Z853 Personal history of malignant neoplasm of breast: Secondary | ICD-10-CM | POA: Diagnosis not present

## 2013-11-03 DIAGNOSIS — E785 Hyperlipidemia, unspecified: Secondary | ICD-10-CM | POA: Diagnosis not present

## 2013-11-03 DIAGNOSIS — M4802 Spinal stenosis, cervical region: Secondary | ICD-10-CM | POA: Diagnosis not present

## 2013-11-03 NOTE — ED Notes (Signed)
Pt A&OX4, ambulatory at d/c with steady gait, NAD. Declined wheelchair at d/c.

## 2013-11-03 NOTE — ED Notes (Addendum)
She hit back of her head on car door on 6/9. Since then shes had headaches, neck pain and nausea. She denies LOC. She is A&Ox4.

## 2013-11-03 NOTE — ED Provider Notes (Signed)
CSN: [id number]     Arrival date & time 11/03/13  1541 History   First MD Initiated Contact with Patient 11/03/13 1809     Chief Complaint  Patient presents with  . Neck Pain     (Consider location/radiation/quality/duration/timing/severity/associated sxs/prior Treatment) Patient is a 73 y.o. female presenting with neck pain. The history is provided by the patient.  Neck Pain  She complains of head and neck pain since 10/27/2013, when she hit her head, while getting into a car. Since then she has had ongoing pain in her head and neck. The pain is worse at night when she is trying to sleep. She does not have any paresthesias. She feels that she has less movement capability of the fingers of the hands. She has not had any trouble walking. She denies nausea, vomiting, cough, shortness of breath, chest pain, or back pain. She had cervical decompression surgery done, 10 months ago. There are no other known modifying factors.  Past Medical History  Diagnosis Date  . Diabetes mellitus   . Hypertension   . Anxiety   . Aortic valve insufficiency     mild  . Hyperlipemia   . Hypothyroidism   . Elevated liver function tests     fatty liver per CT 2009  . Diverticulosis of colon   . Osteoarthritis   . Cervical stenosis of spine     sees neuro surgery and dr.ramons s/p local injection 4/09  . Breast cancer 04/2007    s/p XRT (last 4/09)  . Urticaria 06/2009  . 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 Adenomatous polyps 1998-12-10  . Hemorrhoids   . Restless leg syndrome   . Difficulty in urination     pt uses cath at times  . Chronic constipation   . Full body hives     if patient does not take Fexofenadine   Past Surgical History  Procedure Laterality Date  . Breast lumpectomy  2009    left breast  . Appendectomy    . Cataract extraction, bilateral  2/11  . Sinus cyst removed    . Eye surgery    . Cardiac catheterization  2001  . Anterior cervical decomp/discectomy fusion  N/A 01/08/2013    Procedure: Cervical three-four, four-five, five-six anterior cervical decompression with fusion plating and bonegraft;  Surgeon: Eustace Moore, MD;  Location: [facility];  Service: Neurosurgery;  Laterality: N/A;  Cervical three-four, four-five, five-six anterior cervical decompression with fusion plating and bonegraft   Family History  Problem Relation Age of Onset  . Coronary artery disease Neg Hx   . Hypertension Mother   . Diabetes Neg Hx   . Stroke Neg Hx   . Colon cancer Neg Hx   . Breast cancer Neg Hx   . Lung cancer Mother   . Tuberculosis Father     died at 77   History  Substance Use Topics  . Smoking status: Never Smoker   . Smokeless tobacco: Not on file  . Alcohol Use: Yes     Comment: rare   OB History   Grav Para Term Preterm Abortions TAB SAB Ect Mult Living                 Review of Systems  Musculoskeletal: Positive for neck pain.  All other systems reviewed and are negative.     Allergies  Famotidine; Iodine; and Nsaids  Home Medications   Prior to Admission medications  Medication Sig Start Date End Date Taking? Authorizing Provider  baclofen (LIORESAL) 10 MG tablet Take 5 mg by mouth daily as needed. For leg cramps   Yes Historical Provider, MD  calcium-vitamin D (OSCAL WITH D) 500-200 MG-UNIT per tablet Take 1 tablet by mouth.   Yes Historical Provider, MD  carbidopa-levodopa (SINEMET IR) 25-100 MG per tablet TAKE 1 TABLET BY MOUTH AT BEDTIME AS NEEDED FOR RESTLESS LEG SYNDROME 06/16/13  Yes Colon Branch, MD  fexofenadine (ALLEGRA) 180 MG tablet Take 180 mg by mouth daily.   Yes Historical Provider, MD  glucose blood (ONE TOUCH TEST STRIPS) test strip Check once daily. 11/15/11  Yes Colon Branch, MD  Insulin Glargine (LANTUS) 100 UNIT/ML Solostar Pen Inject 32 Units into the skin daily. 06/03/13  Yes Colon Branch, MD  levothyroxine (SYNTHROID, LEVOTHROID) 125 MCG tablet Take 1 tablet (125 mcg total) by mouth daily. 06/16/13  Yes Colon Branch, MD  lisinopril (PRINIVIL,ZESTRIL) 10 MG tablet TAKE 1 TABLET (10 MG TOTAL) BY MOUTH DAILY.   Yes Colon Branch, MD  metFORMIN (GLUCOPHAGE) 500 MG tablet Take 1,000 mg by mouth 2 (two) times daily with a meal. 06/16/13  Yes Colon Branch, MD  [facility] LANCETS MISC Check once daily. 11/15/11  Yes Colon Branch, MD  traMADol (ULTRAM) 50 MG tablet TAKE 1 TABLET BY MOUTH 4 TIMES A DAY AS NEEDED FOR PAIN 12/13/10  Yes Colon Branch, MD  triamterene-hydrochlorothiazide (MAXZIDE) 75-50 MG per tablet Take 0.5 tablets by mouth daily.     Yes Historical Provider, MD  zolpidem (AMBIEN) 10 MG tablet Take 5 mg by mouth at bedtime as needed for sleep. 10/14/13  Yes Colon Branch, MD   BP 150/72  Pulse 75  Temp(Src) 98.1 F (36.7 C) (Oral)  Resp 16  Ht 5\' 4"  (1.626 m)  Wt 160 lb (72.576 kg)  BMI 27.45 kg/m2  SpO2 98% Physical Exam  Nursing note and vitals reviewed. Constitutional: She is oriented to person, place, and time. She appears well-developed and well-nourished.  HENT:  Head: Normocephalic and atraumatic.  There are no other contusions, abrasions, or lacerations of the scalp.  Eyes: Conjunctivae and EOM are normal. Pupils are equal, round, and reactive to light.  Neck: Normal range of motion and phonation normal. Neck supple.  Cardiovascular: Normal rate, regular rhythm and intact distal pulses.   Pulmonary/Chest: Effort normal and breath sounds normal. She exhibits no tenderness.  Abdominal: Soft. She exhibits no distension. There is no tenderness. There is no guarding.  Musculoskeletal: Normal range of motion. She exhibits no edema and no tenderness.  The neck is nontender to palpation.  Neurological: She is alert and oriented to person, place, and time. She exhibits normal muscle tone.  No dysarthria, aphasia or nystagmus  Skin: Skin is warm and dry.  Psychiatric: She has a normal mood and affect. Her behavior is normal. Judgment and thought content normal.    ED Course  Procedures  (including critical care time) Medications - No data to display  Patient Vitals for the past 24 hrs:  BP Temp Temp src Pulse Resp SpO2 Height Weight  11/03/13 1914 150/72 mmHg - - 75 - 98 % - -  11/03/13 1840 133/62 mmHg 98.1 F (36.7 C) Oral 73 16 100 % - -  11/03/13 1830 119/79 mmHg - - 70 - 99 % - -  11/03/13 1815 123/78 mmHg - - 74 - 100 % - -  11/03/13 1800  136/68 mmHg - - - - - - -  11/03/13 1602 145/64 mmHg 97.8 F (36.6 C) Oral 81 20 99 % 5\' 4"  (1.626 m) 160 lb (72.576 kg)    6:22 PM Reevaluation with update and discussion. After initial assessment and treatment, an updated evaluation reveals no further c/o. Findings discussed with pt and husband. All questions answered.. Victor Review Labs Reviewed - No data to display  Imaging Review Ct Head Wo Contrast  11/03/2013   CLINICAL DATA:  Pain after head trauma.  EXAM: CT HEAD WITHOUT CONTRAST  CT CERVICAL SPINE WITHOUT CONTRAST  TECHNIQUE: Multidetector CT imaging of the head and cervical spine was performed following the standard protocol without intravenous contrast. Multiplanar CT image reconstructions of the cervical spine were also generated.  COMPARISON:  11/04/2007 head CT  FINDINGS: CT HEAD FINDINGS  Skull and Sinuses:No fracture or destructive process.  Orbits: Bilateral cataract resection.  Brain: No evidence of acute abnormality, such as acute infarction, hemorrhage, hydrocephalus, or mass lesion/mass effect. Generalized cerebral volume loss.  CT CERVICAL SPINE FINDINGS  Status post C3-4, C4-5, and C5-6 anterior cervical discectomy with ventral plate and screw fixation. There is complete interbody osseous fusion. No definitive progression of adjacent level degenerative disc disease at C6-7. No evidence of hardware fracture or displacement.  No osseous fracture or traumatic malalignment. No prevertebral edema or gross cervical canal hematoma.  IMPRESSION: 1. No evidence of acute intracranial or cervical spine  injury. 2. C3-C6 ACDF. No adverse features. There has been completed osseous interbody fusion. 3. Generalized brain atrophy.   Electronically Signed   By: Jorje Guild M.D.   On: 11/03/2013 19:13   Ct Cervical Spine Wo Contrast  11/03/2013   CLINICAL DATA:  Pain after head trauma.  EXAM: CT HEAD WITHOUT CONTRAST  CT CERVICAL SPINE WITHOUT CONTRAST  TECHNIQUE: Multidetector CT imaging of the head and cervical spine was performed following the standard protocol without intravenous contrast. Multiplanar CT image reconstructions of the cervical spine were also generated.  COMPARISON:  11/04/2007 head CT  FINDINGS: CT HEAD FINDINGS  Skull and Sinuses:No fracture or destructive process.  Orbits: Bilateral cataract resection.  Brain: No evidence of acute abnormality, such as acute infarction, hemorrhage, hydrocephalus, or mass lesion/mass effect. Generalized cerebral volume loss.  CT CERVICAL SPINE FINDINGS  Status post C3-4, C4-5, and C5-6 anterior cervical discectomy with ventral plate and screw fixation. There is complete interbody osseous fusion. No definitive progression of adjacent level degenerative disc disease at C6-7. No evidence of hardware fracture or displacement.  No osseous fracture or traumatic malalignment. No prevertebral edema or gross cervical canal hematoma.  IMPRESSION: 1. No evidence of acute intracranial or cervical spine injury. 2. C3-C6 ACDF. No adverse features. There has been completed osseous interbody fusion. 3. Generalized brain atrophy.   Electronically Signed   By: Jorje Guild M.D.   On: 11/03/2013 19:13     EKG Interpretation None      MDM   Final diagnoses:  Headache  Neck pain    Nonspecific neck pain. Doubt hardware failure, or significant injury.   Nursing Notes Reviewed/ Care Coordinated Applicable Imaging Reviewed Interpretation of Laboratory Data incorporated into ED treatment  The patient appears reasonably screened and/or stabilized for discharge  and I doubt any other medical condition or other [facility] requiring further screening, evaluation, or treatment in the ED at this time prior to discharge.  Plan: Home Medications- Tylenol, heat; Home Treatments- rest; return here if  the recommended treatment, does not improve the symptoms; Recommended follow up- PCP prn    Richarda Blade, MD 11/04/13 1212

## 2013-11-03 NOTE — Discharge Instructions (Signed)
Headache and Arthritis Headaches and arthritis are common problems. This causes an interest in the possible role of arthritis in causing headaches. Several major forms of arthritis exist. Two of the most common types are:  Rheumatoid arthritis.  Osteoarthritis. Rheumatoid arthritis may begin at any age. It is a condition in which the body attacks some of its own tissues, thinking they do not belong. This leads to destruction of the bony areas around the joints. This condition may afflict any of the body's joints. It usually produces a deformity of the joint. The hands and fingers no longer appear straight but often appear angled towards one side. In some cases, the spine may be involved. Most often it is the vertebrae of the neck (cervicalspine). The areas of the neck most commonly afflicted by rheumatoid arthritis are the first and second cervical vertebrae. Curiously, rheumatoid arthritis, though it often produces severe deformities, is not always painful.  The more common form of arthritis is osteoarthritis. It is a wear-and-tear form of arthritis. It usually does not produce deformity of the joints or destruction of the bony tissues. Rather the ligaments weaken. They may be calcified due to the body's attempt to heal the damage. The larger joints of the body and those joints that take the most stress and strain are the most often affected. In the neck region this osteoarthritis usually involves the fifth, sixth and seventh vertebrae. This is because the effects of posture produce the most fatigue on them. Osteoarthritis is often more painful than rheumatoid arthritis.  During workups for arthritis, a test evaluating inflammation, (the sedimentation rate) often is performed. In rheumatoid arthritis, this test will usually be elevated. Other tests for inflammation may also be elevated. In patients with osteoarthritis, x-rays of the neck or jaw joints will show changes from "lipping" of the vertebrae. This  is caused by calcium deposits in the ligaments. Or they may show narrowing of the space between the vertebrae, or spur formation (from calcium deposits). If severe, it may cause obstruction of the holes where the nerves pass from the spine to the body. In rheumatoid arthritis, dislocation of vertebrae may occur in the upper neck. CT scan and MRI in patients with osteoarthritis may show bulging of the discs that cushion the vertebrae. In the most severe cases, herniation of the discs may occur.  Headaches, felt as a pain in the neck, may be caused by arthritis if the first, second or third vertebrae are involved. This condition is due to the nerves that supply the scalp only originating from this area of the spine. Neck pain itself, whether alone or coupled with headaches, can involve any portion of the neck. If the jaw is involved, the symptoms are similar to those of Temporomandibular Joint Syndrome (TMJ).  The progressive severity of rheumatoid arthritis may be slowed by a variety of potent medications. In osteoarthritis, its progression is not usually hindered by medication. The following may be helpful in slowing the advancement of the disorder:  Lifestyle adjustment.  Exercise.  Rest.  Weight loss. Medications, such as the nonsteroidal anti-inflammatory agents (NSAIDs), are useful. They may reduce the pain and improve the reduced motion which occurs in joints afflicted by arthritis. From some studies, the use of acetaminophen appears to be as effective in controlling the pain of arthritis as the NSAIDs. Physical modalities may also be useful for arthritis. They include:  Heat.  Massage.  Exercise. But physical therapy must be prescribed by a caregiver, just as most medications for  arthritis.  Document Released: 07/28/2003 Document Revised: 07/30/2011 Document Reviewed: 12/25/2007 [facility] Patient Information 2014 [place], [place].

## 2013-11-05 ENCOUNTER — Telehealth: Payer: Self-pay | Admitting: Internal Medicine

## 2013-11-05 NOTE — Telephone Encounter (Signed)
Caller name: Kana Reimann Relation to pt: patient Call back number: [phone] Pharmacy:  Reason for call: patient called and stated that it was time for her Prolia injection on 12/14/2013.

## 2013-11-06 NOTE — Telephone Encounter (Signed)
prolia reverify form faxed 11/06/13 .

## 2013-11-13 DIAGNOSIS — M545 Low back pain, unspecified: Secondary | ICD-10-CM | POA: Diagnosis not present

## 2013-11-13 DIAGNOSIS — M47817 Spondylosis without myelopathy or radiculopathy, lumbosacral region: Secondary | ICD-10-CM | POA: Diagnosis not present

## 2013-11-23 DIAGNOSIS — I10 Essential (primary) hypertension: Secondary | ICD-10-CM | POA: Diagnosis not present

## 2013-11-23 DIAGNOSIS — Z6827 Body mass index (BMI) 27.0-27.9, adult: Secondary | ICD-10-CM | POA: Diagnosis not present

## 2013-11-23 DIAGNOSIS — M542 Cervicalgia: Secondary | ICD-10-CM | POA: Diagnosis not present

## 2013-11-23 NOTE — Telephone Encounter (Addendum)
Benefit forms received and approved 11/10/2013. LM for patient to return call. After scheduling can order injection.

## 2013-11-27 NOTE — Telephone Encounter (Signed)
Pt notified of her prolia summary report for coverage 0$ out of pocket - pt agrees to proceed with procedure. pt scheduled 12/08/13 appointment @ DR.PAZ .

## 2013-12-07 ENCOUNTER — Telehealth: Payer: Self-pay

## 2013-12-07 DIAGNOSIS — M47812 Spondylosis without myelopathy or radiculopathy, cervical region: Secondary | ICD-10-CM | POA: Diagnosis not present

## 2013-12-07 DIAGNOSIS — Z6827 Body mass index (BMI) 27.0-27.9, adult: Secondary | ICD-10-CM | POA: Diagnosis not present

## 2013-12-07 DIAGNOSIS — E119 Type 2 diabetes mellitus without complications: Secondary | ICD-10-CM

## 2013-12-07 DIAGNOSIS — E785 Hyperlipidemia, unspecified: Secondary | ICD-10-CM

## 2013-12-07 DIAGNOSIS — S0990XA Unspecified injury of head, initial encounter: Secondary | ICD-10-CM | POA: Diagnosis not present

## 2013-12-07 NOTE — Telephone Encounter (Signed)
Diabetic bundle  Patient has an appt tomorrow (12-08-13). BP will get rechecked then.   Lipid and A1C ordered

## 2013-12-08 ENCOUNTER — Encounter: Payer: Self-pay | Admitting: Internal Medicine

## 2013-12-08 ENCOUNTER — Ambulatory Visit (INDEPENDENT_AMBULATORY_CARE_PROVIDER_SITE_OTHER): Payer: Medicare Other | Admitting: Internal Medicine

## 2013-12-08 VITALS — BP 120/83 | HR 66 | Temp 98.2°F | Ht 64.1 in | Wt 161.0 lb

## 2013-12-08 DIAGNOSIS — M810 Age-related osteoporosis without current pathological fracture: Secondary | ICD-10-CM | POA: Diagnosis not present

## 2013-12-08 DIAGNOSIS — E039 Hypothyroidism, unspecified: Secondary | ICD-10-CM | POA: Diagnosis not present

## 2013-12-08 DIAGNOSIS — E785 Hyperlipidemia, unspecified: Secondary | ICD-10-CM

## 2013-12-08 DIAGNOSIS — E119 Type 2 diabetes mellitus without complications: Secondary | ICD-10-CM

## 2013-12-08 DIAGNOSIS — I10 Essential (primary) hypertension: Secondary | ICD-10-CM

## 2013-12-08 DIAGNOSIS — R252 Cramp and spasm: Secondary | ICD-10-CM

## 2013-12-08 DIAGNOSIS — Z0000 Encounter for general adult medical examination without abnormal findings: Secondary | ICD-10-CM

## 2013-12-08 DIAGNOSIS — R945 Abnormal results of liver function studies: Secondary | ICD-10-CM

## 2013-12-08 MED ORDER — DENOSUMAB 60 MG/ML ~~LOC~~ SOLN
60.0000 mg | Freq: Once | SUBCUTANEOUS | Status: AC
  Administered 2013-12-08: 60 mg via SUBCUTANEOUS

## 2013-12-08 MED ORDER — BACLOFEN 10 MG PO TABS: 5.0000 mg | ORAL_TABLET | Freq: Every day | ORAL | Status: DC | PRN

## 2013-12-08 MED ORDER — MELOXICAM 7.5 MG PO TABS: 7.5000 mg | ORAL_TABLET | Freq: Every day | ORAL | Status: DC

## 2013-12-08 MED ORDER — LISINOPRIL 10 MG PO TABS: ORAL_TABLET | ORAL | Status: DC

## 2013-12-08 NOTE — Assessment & Plan Note (Signed)
monitor LFTs, see previous entries

## 2013-12-08 NOTE — Progress Notes (Signed)
Pre visit review using our clinic review tool, if applicable. No additional management support is needed unless otherwise documented below in the visit note. 

## 2013-12-08 NOTE — Patient Instructions (Signed)
Get your blood work before you leave     Next visit is for routine check up : 3 months  

## 2013-12-08 NOTE — Assessment & Plan Note (Addendum)
On prolia  Last DEXA ~ 2-3 years ago, will get a DEXA

## 2013-12-08 NOTE — Assessment & Plan Note (Signed)
Good med compliance, reports TSH few months ago not on target but no change was made to her rx. Plan-- labs

## 2013-12-08 NOTE — Progress Notes (Signed)
Subjective:    Patient ID: Sharon Armstrong, female    DOB: 05-21-1941, 73 y.o.   MRN: [id number]  DOS:  12/08/2013 Type of visit - description: ROV, part of her medical care is delivered by Dr Danise Mina in [place] History: DM-- good med compliance, amb CBGs in AM ~ 149, no PM readings HTN-- good med compliance , no amb bp  readings Leg cramps-- needs rx for lioseral RLS-- had to increase synemet dose to 1.5 tabs Had a head injury, ER notes reviewed, see a/p    ROS  No  CP, SOB No palpitations Denies  nausea, vomiting diarrhea, blood in the stools No low sugar readinds or symptoms     Past Medical History  Diagnosis Date  . Diabetes mellitus   . Hypertension   . Anxiety   . Aortic valve insufficiency     mild  . Hyperlipemia   . Hypothyroidism   . Elevated liver function tests     fatty liver per CT 2009  . Diverticulosis of colon   . Osteoarthritis   . Cervical stenosis of spine     sees neuro surgery and dr.ramons s/p local injection 4/09  . Breast cancer 04/2007    s/p XRT (last 4/09)  . Urticaria 06/2009  . 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 Adenomatous polyps 1998-12-11  . Hemorrhoids   . Restless leg syndrome   . Difficulty in urination     pt uses cath at times  . Chronic constipation   . Full body hives     if patient does not take Fexofenadine    Past Surgical History  Procedure Laterality Date  . Breast lumpectomy  2009    left breast  . Appendectomy    . Cataract extraction, bilateral  2/11  . Sinus cyst removed    . Eye surgery    . Cardiac catheterization  2001  . Anterior cervical decomp/discectomy fusion N/A 01/08/2013    Procedure: Cervical three-four, four-five, five-six anterior cervical decompression with fusion plating and bonegraft;  Surgeon: Eustace Moore, MD;  Location: [place] [facility];  Service: Neurosurgery;  Laterality: N/A;  Cervical three-four, four-five, five-six anterior cervical decompression with fusion plating and  bonegraft    History   Social History  . Marital Status: Married    Spouse Name: N/A    Number of Children: 2  . Years of Education: N/A   Occupational History  . custom decorator    Social History Main Topics  . Smoking status: Never Smoker   . Smokeless tobacco: Not on file  . Alcohol Use: Yes     Comment: rare  . Drug Use: No  . Sexual Activity: Not on file   Other Topics Concern  . Not on file   Social History Narrative   Lives in [place] half of the time            Medication List       This list is accurate as of: 12/08/13  6:17 PM.  Always use your most recent med list.               baclofen 10 MG tablet  Commonly known as:  LIORESAL  Take 0.5 tablets (5 mg total) by mouth daily as needed. For leg cramps     calcium-vitamin D 500-200 MG-UNIT per tablet  Commonly known as:  OSCAL WITH D  Take 1 tablet by mouth.     carbidopa-levodopa  25-100 MG per tablet  Commonly known as:  SINEMET IR  TAKE 1.5  TABLET BY MOUTH AT BEDTIME AS NEEDED FOR RESTLESS LEG SYNDROME     fexofenadine 180 MG tablet  Commonly known as:  ALLEGRA  Take 180 mg by mouth daily.     glucose blood test strip  Commonly known as:  ONE TOUCH TEST STRIPS  Check once daily.     Insulin Glargine 100 UNIT/ML Solostar Pen  Commonly known as:  LANTUS  Inject 32 Units into the skin daily.     levothyroxine 125 MCG tablet  Commonly known as:  SYNTHROID, LEVOTHROID  Take 1 tablet (125 mcg total) by mouth daily.     lisinopril 10 MG tablet  Commonly known as:  PRINIVIL,ZESTRIL  TAKE 1 TABLET (10 MG TOTAL) BY MOUTH DAILY.     meloxicam 7.5 MG tablet  Commonly known as:  MOBIC  Take 1 tablet (7.5 mg total) by mouth daily.     metFORMIN 500 MG tablet  Commonly known as:  GLUCOPHAGE  Take 1,000 mg by mouth 2 (two) times daily with a meal.     ONETOUCH DELICA LANCETS Misc  Check once daily.     traMADol 50 MG tablet  Commonly known as:  ULTRAM  TAKE 1 TABLET BY MOUTH 4 TIMES A  DAY AS NEEDED FOR PAIN     triamterene-hydrochlorothiazide 75-50 MG per tablet  Commonly known as:  MAXZIDE  Take 0.5 tablets by mouth daily.     zolpidem 10 MG tablet  Commonly known as:  AMBIEN  Take 5 mg by mouth at bedtime as needed for sleep.           Objective:   Physical Exam BP 120/83  Pulse 66  Temp(Src) 98.2 F (36.8 C)  Ht 5' 4.1" (1.628 m)  Wt 161 lb (73.029 kg)  BMI 27.55 kg/m2  SpO2 96% General -- alert, well-developed, NAD.    HEENT-- Not pale.  Lungs -- normal respiratory effort, no intercostal retractions, no accessory muscle use, and normal breath sounds.  Heart-- normal rate, regular rhythm, no murmur.  Abdomen-- Not distended, good bowel sounds,soft, non-tender. Extremities-- no pretibial edema bilaterally  Neurologic--  alert & oriented X3. Speech normal, gait appropriate for age, strength symmetric and appropriate for age.  Psych-- Cognition and judgment appear intact. Cooperative with normal attention span and concentration. No anxious or depressed appearing.      Assessment & Plan:

## 2013-12-08 NOTE — Assessment & Plan Note (Signed)
Reports a long history of leg cramps, previously had a specialist in [place] prescribed baclofen (Dr Alvira Monday)  which she uses prn, request a rx

## 2013-12-08 NOTE — Assessment & Plan Note (Addendum)
Good med compliance, not well controlled per last available a1C (i don't know the a1c in [place]) Labs Asked pt to get latest a1c

## 2013-12-08 NOTE — Assessment & Plan Note (Addendum)
States Dr Danise Mina checked  a chol panel

## 2013-12-08 NOTE — Assessment & Plan Note (Signed)
Seems well controled, labs , RF lisinopril

## 2013-12-08 NOTE — Assessment & Plan Note (Signed)
I again told pt I'm not tracking her colonoscopies, mammograms, immunizations etc. Since she is not doing her CPX w/ me, my concern is that I won't be able to alert her if she is due for a test or lab or shot; she is aware of my concerns

## 2013-12-09 ENCOUNTER — Telehealth: Payer: Self-pay | Admitting: Internal Medicine

## 2013-12-09 LAB — COMPREHENSIVE METABOLIC PANEL
ALBUMIN: 3.7 g/dL (ref 3.5–5.2)
ALK PHOS: 36 U/L — AB (ref 39–117)
ALT: 68 U/L — ABNORMAL HIGH (ref 0–35)
AST: 51 U/L — AB (ref 0–37)
BILIRUBIN TOTAL: 0.6 mg/dL (ref 0.2–1.2)
BUN: 21 mg/dL (ref 6–23)
CO2: 27 mEq/L (ref 19–32)
Calcium: 9.8 mg/dL (ref 8.4–10.5)
Chloride: 104 mEq/L (ref 96–112)
Creatinine, Ser: 0.7 mg/dL (ref 0.4–1.2)
GFR: 87.27 mL/min (ref >60.00)
Glucose, Bld: 82 mg/dL (ref 70–99)
Potassium: 4 mEq/L (ref 3.5–5.1)
SODIUM: 141 meq/L (ref 135–145)
TOTAL PROTEIN: 7.3 g/dL (ref 6.0–8.3)

## 2013-12-09 LAB — HEMOGLOBIN A1C: HEMOGLOBIN A1C: 8.2 % — AB (ref 4.6–6.5)

## 2013-12-09 LAB — TSH: TSH: 0.16 u[IU]/mL — ABNORMAL LOW (ref 0.35–4.50)

## 2013-12-09 NOTE — Telephone Encounter (Signed)
Relevant patient education assigned to patient using Emmi. ° °

## 2013-12-10 ENCOUNTER — Other Ambulatory Visit: Payer: Self-pay | Admitting: Internal Medicine

## 2013-12-10 DIAGNOSIS — M5030 Other cervical disc degeneration, unspecified cervical region: Secondary | ICD-10-CM | POA: Diagnosis not present

## 2013-12-10 DIAGNOSIS — S0990XA Unspecified injury of head, initial encounter: Secondary | ICD-10-CM | POA: Diagnosis not present

## 2013-12-10 DIAGNOSIS — R51 Headache: Secondary | ICD-10-CM | POA: Diagnosis not present

## 2013-12-11 MED ORDER — LEVOTHYROXINE SODIUM 100 MCG PO TABS: 100.0000 ug | ORAL_TABLET | Freq: Every day | ORAL | Status: DC

## 2013-12-11 NOTE — Addendum Note (Signed)
Addended by: Peggyann Shoals on: 12/11/2013 11:24 AM   Modules accepted: Orders, Medications

## 2013-12-15 ENCOUNTER — Telehealth: Payer: Self-pay | Admitting: Internal Medicine

## 2013-12-15 DIAGNOSIS — N390 Urinary tract infection, site not specified: Secondary | ICD-10-CM

## 2013-12-15 NOTE — Telephone Encounter (Signed)
If she is able to give [place] a UA with c&s I do not need to see her but that would be best to make sure we are treating th right bacteria [place] then I will prescribe an antibiotic

## 2013-12-15 NOTE — Telephone Encounter (Signed)
Caller name: Vannessa Relation to pt: Call back Fordyce:  Reason for call:  Pt is going to [place] and is getting a UTI.  Pt wants to know if she can get something called in.  Please advise

## 2013-12-16 ENCOUNTER — Other Ambulatory Visit (INDEPENDENT_AMBULATORY_CARE_PROVIDER_SITE_OTHER): Payer: Medicare Other

## 2013-12-16 DIAGNOSIS — N390 Urinary tract infection, site not specified: Secondary | ICD-10-CM | POA: Diagnosis not present

## 2013-12-16 LAB — POCT URINALYSIS DIPSTICK
BILIRUBIN UA: NEGATIVE
GLUCOSE UA: NEGATIVE
Ketones, UA: NEGATIVE
Leukocytes, UA: NEGATIVE
Nitrite, UA: NEGATIVE
Protein, UA: NEGATIVE
RBC UA: NEGATIVE
Spec Grav, UA: 1.025
UROBILINOGEN UA: 0.2
pH, UA: 5

## 2013-12-16 NOTE — Telephone Encounter (Signed)
Pt provided UA .advised pt that we are going to wait for the results before providing rx. Pt states that levofloxacin 500 mg only works for her UTI's.

## 2013-12-16 NOTE — Telephone Encounter (Signed)
Orders entered

## 2013-12-16 NOTE — Telephone Encounter (Signed)
Pt called back and will give a urine specimen. Do you need to enter an order??

## 2013-12-17 ENCOUNTER — Other Ambulatory Visit: Payer: Medicare Other

## 2013-12-17 DIAGNOSIS — N390 Urinary tract infection, site not specified: Secondary | ICD-10-CM | POA: Diagnosis not present

## 2013-12-17 NOTE — Addendum Note (Signed)
Addended by: Modena Morrow D on: 12/17/2013 02:43 PM   Modules accepted: Orders

## 2013-12-20 LAB — URINE CULTURE

## 2013-12-21 ENCOUNTER — Telehealth: Payer: Self-pay | Admitting: Internal Medicine

## 2013-12-21 NOTE — Telephone Encounter (Signed)
Caller name:Cledith Relation to pt: Call back number:[phone]   Reason for call:  Pt called back.

## 2013-12-22 MED ORDER — LEVOFLOXACIN 500 MG PO TABS
500.0000 mg | ORAL_TABLET | Freq: Every day | ORAL | Status: DC
Start: 2013-12-22 — End: 2014-08-25

## 2013-12-22 NOTE — Telephone Encounter (Signed)
Pt advised that the antibiotic was sent today.

## 2013-12-22 NOTE — Addendum Note (Signed)
Addended by: Peggyann Shoals on: 12/22/2013 08:16 AM   Modules accepted: Orders

## 2014-01-20 ENCOUNTER — Telehealth: Payer: Self-pay | Admitting: Internal Medicine

## 2014-01-20 NOTE — Telephone Encounter (Signed)
We recently increased her insulin dose, please ask patient how her sugars are running.

## 2014-01-20 NOTE — Telephone Encounter (Signed)
LMOM for Pt to return call.  

## 2014-01-26 NOTE — Telephone Encounter (Signed)
MyChart message sent to Pt regarding her blood sugars.

## 2014-02-09 ENCOUNTER — Ambulatory Visit
Admission: RE | Admit: 2014-02-09 | Discharge: 2014-02-09 | Disposition: A | Discharge status: Home / Self Care | Payer: Medicare Other | Source: Ambulatory Visit | Attending: Internal Medicine | Admitting: Internal Medicine

## 2014-02-09 DIAGNOSIS — M899 Disorder of bone, unspecified: Secondary | ICD-10-CM | POA: Diagnosis not present

## 2014-02-09 DIAGNOSIS — M949 Disorder of cartilage, unspecified: Secondary | ICD-10-CM | POA: Diagnosis not present

## 2014-02-09 DIAGNOSIS — Z780 Asymptomatic menopausal state: Secondary | ICD-10-CM | POA: Diagnosis not present

## 2014-02-09 DIAGNOSIS — M810 Age-related osteoporosis without current pathological fracture: Secondary | ICD-10-CM

## 2014-02-09 LAB — HM DEXA SCAN

## 2014-02-12 ENCOUNTER — Encounter: Payer: Self-pay | Admitting: Internal Medicine

## 2014-02-17 DIAGNOSIS — M545 Low back pain, unspecified: Secondary | ICD-10-CM | POA: Diagnosis not present

## 2014-03-01 ENCOUNTER — Other Ambulatory Visit: Payer: Self-pay

## 2014-03-01 MED ORDER — CARBIDOPA-LEVODOPA 25-100 MG PO TABS: 1.0000 | ORAL_TABLET | Freq: Every evening | ORAL | Status: DC | PRN

## 2014-03-09 ENCOUNTER — Other Ambulatory Visit: Payer: Self-pay

## 2014-03-10 ENCOUNTER — Other Ambulatory Visit: Payer: Self-pay

## 2014-03-10 MED ORDER — INSULIN GLARGINE 100 UNIT/ML SOLOSTAR PEN: PEN_INJECTOR | SUBCUTANEOUS | Status: DC

## 2014-04-02 ENCOUNTER — Other Ambulatory Visit: Payer: Self-pay

## 2014-04-02 ENCOUNTER — Telehealth: Payer: Self-pay | Admitting: Internal Medicine

## 2014-04-02 MED ORDER — CARBIDOPA-LEVODOPA 25-100 MG PO TABS: 1.0000 | ORAL_TABLET | Freq: Every evening | ORAL | Status: DC | PRN

## 2014-04-02 NOTE — Telephone Encounter (Signed)
Schedule office visit for next week, I cannot handle this over the phone, she is due for labs and office visit

## 2014-04-02 NOTE — Telephone Encounter (Signed)
Blood sugars having been running high... Wondering if she has a bad batch of insulin. Mornings have been around 200s and after lunch in 300s. On going for the last week or more. Please advise

## 2014-04-02 NOTE — Telephone Encounter (Signed)
Caller name: Trenyce Relation to pt: self Call back number:   [phone] Pharmacy: [facility] [place], [place]  Reason for call:   Patient states that she is in [place] and does not see her dr down there for another 1 1/2 weeks and wants to know if Dr. Larose Kells would refill   carbidopa-levodopa  To last her until then.

## 2014-04-02 NOTE — Telephone Encounter (Signed)
Refilled for # 30 days, no refills.

## 2014-04-02 NOTE — Telephone Encounter (Signed)
Please advise 

## 2014-04-05 NOTE — Telephone Encounter (Signed)
Spoke with Pts husband, she is currently in [place] for 6 months, and will see her Dr there. Pts husband did state that she seemed to no longer be having the blood sugar issues.

## 2014-04-07 DIAGNOSIS — E119 Type 2 diabetes mellitus without complications: Secondary | ICD-10-CM | POA: Diagnosis not present

## 2014-04-07 DIAGNOSIS — E039 Hypothyroidism, unspecified: Secondary | ICD-10-CM | POA: Diagnosis not present

## 2014-04-07 DIAGNOSIS — I10 Essential (primary) hypertension: Secondary | ICD-10-CM | POA: Diagnosis not present

## 2014-04-07 LAB — TSH: TSH: 3.09 u[IU]/mL (ref <=5.90)

## 2014-04-12 DIAGNOSIS — Z23 Encounter for immunization: Secondary | ICD-10-CM | POA: Diagnosis not present

## 2014-04-12 DIAGNOSIS — I340 Nonrheumatic mitral (valve) insufficiency: Secondary | ICD-10-CM | POA: Diagnosis not present

## 2014-04-12 DIAGNOSIS — I10 Essential (primary) hypertension: Secondary | ICD-10-CM | POA: Diagnosis not present

## 2014-04-12 DIAGNOSIS — K219 Gastro-esophageal reflux disease without esophagitis: Secondary | ICD-10-CM | POA: Diagnosis not present

## 2014-04-12 DIAGNOSIS — G2581 Restless legs syndrome: Secondary | ICD-10-CM | POA: Diagnosis not present

## 2014-04-12 DIAGNOSIS — I349 Nonrheumatic mitral valve disorder, unspecified: Secondary | ICD-10-CM | POA: Diagnosis not present

## 2014-04-12 DIAGNOSIS — Z0000 Encounter for general adult medical examination without abnormal findings: Secondary | ICD-10-CM | POA: Diagnosis not present

## 2014-04-12 DIAGNOSIS — K760 Fatty (change of) liver, not elsewhere classified: Secondary | ICD-10-CM | POA: Diagnosis not present

## 2014-05-28 DIAGNOSIS — C50412 Malignant neoplasm of upper-outer quadrant of left female breast: Secondary | ICD-10-CM | POA: Diagnosis not present

## 2014-05-28 DIAGNOSIS — Z170 Estrogen receptor positive status [ER+]: Secondary | ICD-10-CM | POA: Diagnosis not present

## 2014-05-28 DIAGNOSIS — G2581 Restless legs syndrome: Secondary | ICD-10-CM | POA: Diagnosis not present

## 2014-06-03 DIAGNOSIS — M810 Age-related osteoporosis without current pathological fracture: Secondary | ICD-10-CM | POA: Diagnosis not present

## 2014-06-03 DIAGNOSIS — C50412 Malignant neoplasm of upper-outer quadrant of left female breast: Secondary | ICD-10-CM | POA: Diagnosis not present

## 2014-06-03 DIAGNOSIS — Z170 Estrogen receptor positive status [ER+]: Secondary | ICD-10-CM | POA: Diagnosis not present

## 2014-07-07 DIAGNOSIS — G2581 Restless legs syndrome: Secondary | ICD-10-CM | POA: Diagnosis not present

## 2014-07-28 DIAGNOSIS — Z888 Allergy status to other drugs, medicaments and biological substances status: Secondary | ICD-10-CM | POA: Diagnosis not present

## 2014-07-28 DIAGNOSIS — M545 Low back pain: Secondary | ICD-10-CM | POA: Diagnosis not present

## 2014-07-28 DIAGNOSIS — M4306 Spondylolysis, lumbar region: Secondary | ICD-10-CM | POA: Diagnosis not present

## 2014-07-28 DIAGNOSIS — M5136 Other intervertebral disc degeneration, lumbar region: Secondary | ICD-10-CM | POA: Diagnosis not present

## 2014-07-28 DIAGNOSIS — E119 Type 2 diabetes mellitus without complications: Secondary | ICD-10-CM | POA: Diagnosis not present

## 2014-07-28 DIAGNOSIS — Z794 Long term (current) use of insulin: Secondary | ICD-10-CM | POA: Diagnosis not present

## 2014-07-28 DIAGNOSIS — M47817 Spondylosis without myelopathy or radiculopathy, lumbosacral region: Secondary | ICD-10-CM | POA: Diagnosis not present

## 2014-07-29 DIAGNOSIS — M545 Low back pain: Secondary | ICD-10-CM | POA: Diagnosis not present

## 2014-07-29 DIAGNOSIS — M47817 Spondylosis without myelopathy or radiculopathy, lumbosacral region: Secondary | ICD-10-CM | POA: Diagnosis not present

## 2014-07-29 DIAGNOSIS — M5136 Other intervertebral disc degeneration, lumbar region: Secondary | ICD-10-CM | POA: Diagnosis not present

## 2014-07-30 ENCOUNTER — Encounter: Payer: Self-pay | Admitting: Gastroenterology

## 2014-08-03 DIAGNOSIS — E119 Type 2 diabetes mellitus without complications: Secondary | ICD-10-CM | POA: Diagnosis not present

## 2014-08-03 DIAGNOSIS — K760 Fatty (change of) liver, not elsewhere classified: Secondary | ICD-10-CM | POA: Diagnosis not present

## 2014-08-03 LAB — LIPID PANEL
CHOLESTEROL: 200 mg/dL (ref 0–200)
Cholesterol: 200 mg/dL (ref 0–200)
HDL: 47 mg/dL (ref 35–70)
LDL Cholesterol: 123 mg/dL
Triglycerides: 149 mg/dL (ref 40–160)
Triglycerides: 149 mg/dL (ref 40–160)

## 2014-08-03 LAB — BASIC METABOLIC PANEL
BUN: 27 mg/dL — AB (ref 4–21)
CREATININE: 0.8 mg/dL (ref <=1.1)
Glucose: 105 mg/dL
Potassium: 4.4 mmol/L (ref 3.4–5.3)
SODIUM: 138 mmol/L (ref 137–147)

## 2014-08-03 LAB — HEMOGLOBIN A1C: Hgb A1c MFr Bld: 8.1 % — AB (ref 4.0–6.0)

## 2014-08-04 DIAGNOSIS — R109 Unspecified abdominal pain: Secondary | ICD-10-CM | POA: Diagnosis not present

## 2014-08-04 DIAGNOSIS — Z853 Personal history of malignant neoplasm of breast: Secondary | ICD-10-CM | POA: Diagnosis not present

## 2014-08-04 DIAGNOSIS — R079 Chest pain, unspecified: Secondary | ICD-10-CM | POA: Diagnosis not present

## 2014-08-04 DIAGNOSIS — R1110 Vomiting, unspecified: Secondary | ICD-10-CM | POA: Diagnosis not present

## 2014-08-04 DIAGNOSIS — Z794 Long term (current) use of insulin: Secondary | ICD-10-CM | POA: Diagnosis not present

## 2014-08-04 DIAGNOSIS — E119 Type 2 diabetes mellitus without complications: Secondary | ICD-10-CM | POA: Diagnosis not present

## 2014-08-04 DIAGNOSIS — R918 Other nonspecific abnormal finding of lung field: Secondary | ICD-10-CM | POA: Diagnosis not present

## 2014-08-06 ENCOUNTER — Telehealth: Payer: Self-pay | Admitting: Internal Medicine

## 2014-08-06 ENCOUNTER — Other Ambulatory Visit: Payer: Self-pay | Admitting: Internal Medicine

## 2014-08-06 ENCOUNTER — Other Ambulatory Visit: Payer: Self-pay

## 2014-08-06 MED ORDER — INSULIN GLARGINE 100 UNIT/ML SOLOSTAR PEN: PEN_INJECTOR | SUBCUTANEOUS | Status: DC

## 2014-08-06 NOTE — Telephone Encounter (Signed)
Informed patient husband and appointment

## 2014-08-06 NOTE — Telephone Encounter (Signed)
Last OV was 12/08/2013, Pt was due for OV in 03/2014, which they live in [facility] during winter months, advised to see Dr. Larose Kells before they left to go to [facility], which they failed to do. Per Dr. Larose Kells, 1 month supply of Lantus ONLY.

## 2014-08-06 NOTE — Telephone Encounter (Signed)
Caller name: roger Relation to pt: husband Call back number: [phone] Pharmacy[facility]. Store# [id number]   P: [phone]  Reason for call:   Requesting lantus refill. Patient is out.

## 2014-08-10 DIAGNOSIS — C50412 Malignant neoplasm of upper-outer quadrant of left female breast: Secondary | ICD-10-CM | POA: Diagnosis not present

## 2014-08-10 DIAGNOSIS — K219 Gastro-esophageal reflux disease without esophagitis: Secondary | ICD-10-CM | POA: Diagnosis not present

## 2014-08-10 DIAGNOSIS — E119 Type 2 diabetes mellitus without complications: Secondary | ICD-10-CM | POA: Diagnosis not present

## 2014-08-10 DIAGNOSIS — G4700 Insomnia, unspecified: Secondary | ICD-10-CM | POA: Diagnosis not present

## 2014-08-10 DIAGNOSIS — Z853 Personal history of malignant neoplasm of breast: Secondary | ICD-10-CM | POA: Diagnosis not present

## 2014-08-13 DIAGNOSIS — H4010X1 Unspecified open-angle glaucoma, mild stage: Secondary | ICD-10-CM | POA: Diagnosis not present

## 2014-08-23 ENCOUNTER — Telehealth: Payer: Self-pay | Admitting: Gastroenterology

## 2014-08-23 NOTE — Telephone Encounter (Signed)
Patient is still in [place].  She is returning here on Wed.  She had an ER visit for abdominal pain a week or so ago in [place].  Her pain has not improved. She reports that they told her she had a very large hiatal hernia.   She will come in and see Nicoletta Ba PA on 08/26/14.  She is advised that she needs to bring a copy of the records from [place] with her.  She states that she has a copy of the ER report, but not the CT.  She is asked to make sure that she has a copy of the CT report and any other imaging or labs with her when she comes for this appt.

## 2014-08-25 ENCOUNTER — Encounter: Payer: Self-pay | Admitting: *Deleted

## 2014-08-25 ENCOUNTER — Other Ambulatory Visit: Payer: Self-pay

## 2014-08-26 ENCOUNTER — Other Ambulatory Visit (INDEPENDENT_AMBULATORY_CARE_PROVIDER_SITE_OTHER): Payer: Medicare Other

## 2014-08-26 ENCOUNTER — Encounter: Payer: Self-pay | Admitting: Physician Assistant

## 2014-08-26 ENCOUNTER — Ambulatory Visit (INDEPENDENT_AMBULATORY_CARE_PROVIDER_SITE_OTHER): Payer: Medicare Other | Admitting: Physician Assistant

## 2014-08-26 VITALS — BP 120/54 | HR 96 | Ht 63.75 in | Wt 161.5 lb

## 2014-08-26 DIAGNOSIS — K858 Other acute pancreatitis without necrosis or infection: Secondary | ICD-10-CM

## 2014-08-26 DIAGNOSIS — R9389 Abnormal findings on diagnostic imaging of other specified body structures: Secondary | ICD-10-CM

## 2014-08-26 DIAGNOSIS — R938 Abnormal findings on diagnostic imaging of other specified body structures: Secondary | ICD-10-CM

## 2014-08-26 LAB — COMPREHENSIVE METABOLIC PANEL
ALT: 59 U/L — ABNORMAL HIGH (ref 0–35)
AST: 54 U/L — AB (ref 0–37)
Albumin: 4.3 g/dL (ref 3.5–5.2)
Alkaline Phosphatase: 32 U/L — ABNORMAL LOW (ref 39–117)
BUN: 23 mg/dL (ref 6–23)
CALCIUM: 10.6 mg/dL — AB (ref 8.4–10.5)
CO2: 28 mEq/L (ref 19–32)
CREATININE: 0.93 mg/dL (ref 0.40–1.20)
Chloride: 102 mEq/L (ref 96–112)
GFR: 62.75 mL/min (ref >60.00)
Glucose, Bld: 107 mg/dL — ABNORMAL HIGH (ref 70–99)
Potassium: 4.5 mEq/L (ref 3.5–5.1)
SODIUM: 139 meq/L (ref 135–145)
Total Bilirubin: 0.6 mg/dL (ref 0.2–1.2)
Total Protein: 8.1 g/dL (ref 6.0–8.3)

## 2014-08-26 LAB — CBC WITH DIFFERENTIAL/PLATELET
Basophils Absolute: 0 10*3/uL (ref 0.0–0.1)
Basophils Relative: 0.1 % (ref 0.0–3.0)
EOS PCT: 0.4 % (ref 0.0–5.0)
Eosinophils Absolute: 0 10*3/uL (ref 0.0–0.7)
HEMATOCRIT: 39.6 % (ref 36.0–46.0)
HEMOGLOBIN: 13.5 g/dL (ref 12.0–15.0)
Lymphocytes Relative: 38.8 % (ref 12.0–46.0)
Lymphs Abs: 2.5 10*3/uL (ref 0.7–4.0)
MCHC: 34.1 g/dL (ref 30.0–36.0)
MCV: 90 fl (ref 78.0–100.0)
MONOS PCT: 10.1 % (ref 3.0–12.0)
Monocytes Absolute: 0.7 10*3/uL (ref 0.1–1.0)
NEUTROS ABS: 3.3 10*3/uL (ref 1.4–7.7)
Neutrophils Relative %: 50.6 % (ref 43.0–77.0)
Platelets: 184 10*3/uL (ref 150.0–400.0)
RBC: 4.4 Mil/uL (ref 3.87–5.11)
RDW: 14.2 % (ref 11.5–15.5)
WBC: 6.5 10*3/uL (ref 4.0–10.5)

## 2014-08-26 LAB — LIPASE: Lipase: 49 U/L (ref 11.0–59.0)

## 2014-08-26 LAB — AMYLASE: Amylase: 34 U/L (ref 27–131)

## 2014-08-26 NOTE — Patient Instructions (Signed)
Please go to the basement level to have your labs drawn.  You have been scheduled for an MRI/MRCP at [facility] on [address]  09-02-2014 . Your appointment time is 8:00 AM. Please arrive at 7:45 am to your appointment time for registration purposes. Please make certain not to have anything to eat or drink 4 hours prior to your test. In addition, if you have any metal in your body, have a pacemaker or defibrillator, please be sure to let your ordering physician know. This test typically takes 45 minutes to 1 hour to complete.  The dye for the MR/MRCP is not iodine based like the CT scan dyes.  You will not have to be premedicated regarding your allergies to Iodine base dyes.

## 2014-08-26 NOTE — Progress Notes (Signed)
Patient ID: Sharon Armstrong, female   DOB: 01-12-1941, 74 y.o.   MRN: [id number]   Subjective:    Patient ID: Sharon Armstrong, female    DOB: [birth year], 74 y.o.   MRN: [id number]  HPI Sharon Armstrong is a very nice 74 year old white female known to Dr. Fuller Plan. She resides in [place] for half a year and in [place] for half the year. She last had colonoscopy in October 2011 had 3-4 small polyps removed at that time which were tubular adenomas and was recommended for 5 year follow-up. She also has mild diverticulosis. She had EGD done in 2006 showing gastritis. Patient comes in today after just having returned from [place] to follow-up after an ER visit for abdominal pain on 08/04/2014. She brought her records with her from that ER visit. She says she presented to the ER because of lower chest and subxiphoid pain radiating into her back associated with nausea and dry heaves. She has been having intermittent similar episodes over the past year which have become more frequent recently and this last episode was worse. She has not had any associated fever or chills with these episodes and says currently she still having an aching discomfort which is been constant over the past week and a half. She does note some increased after by mouth intake especially with fatty or greasy foods. She has not had any further nausea or vomiting. Her appetite has been okay and her weight has been stable. Workup in the emergency room and [place] labs showing a WBC of 9 hemoglobin 14.1 hematocrit of 43.7 LFTs with elevated SGOT of 104 and SGPT 74 and a lipase of 456. She had CT scan of the abdomen and pelvis done at that time they do not think she had oral or IV contrast and CT scan showed no significant abnormality of the chest abdomen or pelvis pancreas was read as normal bile duct is not dilated calcified gallstones. She also had an ER visit a couple days previous to that with low back pain and had been started on prednisone. Sharon Armstrong  Patient tells me that she had undergone some GI workup in about 5 years ago because of concerns for a "shadow" on her pancreas. She actually had an endoscopic ultrasound done and was told that she didn't need any follow-up for 4-5 years. This was done by a Dr. Keane Police  in [place].  Review of Systems Pertinent positive and negative review of systems were noted in the above HPI section.  All other review of systems was otherwise negative.  Outpatient Encounter Prescriptions as of 08/26/2014  Medication Sig  . carbidopa-levodopa (PARCOPA) 25-250 MG per disintegrating tablet Take 1 tablet by mouth at bedtime.  Marland Kitchen estradiol (ESTRACE) 0.1 MG/GM vaginal cream Place 1 Applicatorful vaginally as needed.  . gabapentin (NEURONTIN) 300 MG capsule Take 600 mg by mouth at bedtime.  Marland Kitchen glucose blood (ONE TOUCH TEST STRIPS) test strip Check once daily.  . Insulin Glargine (LANTUS SOLOSTAR) 100 UNIT/ML Solostar Pen Inject 38 units into the skin daily .  . levothyroxine (SYNTHROID, LEVOTHROID) 100 MCG tablet Take 1 tablet (100 mcg total) by mouth daily.  Marland Kitchen lidocaine (LIDODERM) 5 % Place 1 patch onto the skin as needed. Remove & Discard patch within 12 hours or as directed by MD  . lisinopril (PRINIVIL,ZESTRIL) 10 MG tablet TAKE 1 TABLET (10 MG TOTAL) BY MOUTH DAILY.  . meloxicam (MOBIC) 7.5 MG tablet Take 1 tablet (7.5 mg total) by mouth daily. (Patient taking differently: Take 7.5  mg by mouth as needed. )  . metFORMIN (GLUCOPHAGE) 500 MG tablet Take 1,000 mg by mouth 2 (two) times daily with a meal.  . omeprazole (PRILOSEC) 20 MG capsule Take 20 mg by mouth daily.  Sharon Armstrong DELICA LANCETS MISC Check once daily.  . sitaGLIPtin (JANUVIA) 50 MG tablet Take 50 mg by mouth daily.  . traMADol (ULTRAM) 50 MG tablet TAKE 1 TABLET BY MOUTH 4 TIMES A DAY AS NEEDED FOR PAIN  . triamterene-hydrochlorothiazide (MAXZIDE) 75-50 MG per tablet Take 0.5 tablets by mouth daily.    Marland Kitchen zolpidem (AMBIEN) 10 MG tablet Take 5 mg  by mouth at bedtime as needed for sleep.  . [DISCONTINUED] baclofen (LIORESAL) 10 MG tablet Take 0.5 tablets (5 mg total) by mouth daily as needed. For leg cramps  . [DISCONTINUED] calcium-vitamin D (OSCAL WITH D) 500-200 MG-UNIT per tablet Take 1 tablet by mouth.  . [DISCONTINUED] carbidopa-levodopa (SINEMET IR) 25-100 MG per tablet Take 1 tablet by mouth at bedtime as needed (FOR RESTLESS LEG SYNDROME). (Patient not taking: Reported on 08/26/2014)  . [DISCONTINUED] fexofenadine (ALLEGRA) 180 MG tablet Take 180 mg by mouth daily.   Allergies  Allergen Reactions  . Iodine     unknown  . Iohexol   . Ivp Dye [Iodinated Diagnostic Agents]     Rash and hives  . Nsaids     REACTION: hallucinations  . Pepcid [Famotidine]     unknown  . Aciphex [Rabeprazole] Rash  . Nexium [Esomeprazole Magnesium] Rash  . Protonix [Pantoprazole Sodium] Rash   Patient Active Problem List   Diagnosis Date Noted  . Leg cramps 12/08/2013  . Insomnia 06/16/2013  . Annual physical exam 01/25/2011  . UTI (urinary tract infection) 08/30/2010  . Osteoporosis 03/22/2009  . HYPERLIPIDEMIA 03/12/2008  . ANXIETY 11/20/2007  . 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/20/2007  . ADENOCARCINOMA, BREAST 11/05/2007  . CHEST PAIN 11/05/2007  . HYPOTHYROIDISM 11/11/2006  . DIABETES MELLITUS, TYPE II 11/11/2006  . RESTLESS LEG SYNDROME 11/11/2006  . 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  . HYPERTENSION 11/11/2006  . DIVERTICULOSIS, COLON 11/11/2006  . OSTEOARTHRITIS 11/11/2006  . FATIGUE, CHRONIC 11/11/2006  . LIVER FUNCTION TESTS, ABNORMAL 11/11/2006   History   Social History  . Marital Status: Married    Spouse Name: N/A  . Number of Children: 2  . Years of Education: N/A   Occupational History  . custom decorator    Social History Main Topics  . Smoking status: Never Smoker   . Smokeless tobacco: Never Used  . Alcohol Use: 0.0 oz/week    0 Standard drinks or equivalent per week     Comment: rare  . Drug Use: No  .  Sexual Activity: Not on file   Other Topics Concern  . Not on file   Social History Narrative   Lives in [place] half of the time        Ms. Killingsworth's family history includes Brain cancer in her mother; Hypertension in her mother; Lung cancer in her mother; Tuberculosis in her father. There is no history of Coronary artery disease, Diabetes, Stroke, Colon cancer, or Breast cancer.      Objective:    Filed Vitals:   08/26/14 1408  BP: 120/54  Pulse: 96    Physical Exam  well-developed older white female in no acute distress, pleasant accompanied by her husband blood pressure 120/54 pulse 96 height 5 foot 3 weight 161. HEENT; nontraumatic normocephalic EOMI PERRLA sclera anicteric, Supple; no JVD, Cardiovascular ;regular rate and rhythm  with S1-S2 no murmur or gallop, Pulmonary; clear bilaterally, Abdomen; soft nondistended bowel sounds are present she is mild tenderness in the upper abdomen and epigastrium there is no guarding or rebound no palpable mass or hepatosplenomegaly shows have an appendectomy scar, Rectal ;exam not done, Extremities ;no clubbing cyanosis or edema skin warm and dry, Psych; mood and affect appropriate       Assessment & Plan:   #1 74 yo female with acute pancreatitis, mild -etiology not clear- she is improved but still having mild discomfort. Similar less intense episodes over the past year with some recent increase in frequency. Patient reports prior endoscopic ultrasound about 5 years ago done in [place] for some benign abnormality of her pancreas  #2 insulin-dependent diabetes mellitus #3 hypothyroidism #4 chronic low back pain #5 adenomatous colon polyps patient will be due for follow-up later this year #6 history of breast cancer with prior lumpectomy  Plan; Meds were reviewed and no obvious offenders. We will repeat labs today including amylase and lipase Advance advised avoidance of alcohol and low-fat diet Offered any analgesic she has been  using Ultram which she has for her back and this is working well Schedule for MRI of the abdomen with MRCP for further assessment of the pancreas and biliary tree. Further plans pending results of above Caryl Comes Patient signed a release and we will also obtain copies of her previous EUS from the physician in [place].   Modesta Sammons Genia Harold PA-C 08/26/2014   Cc: Colon Branch, MD

## 2014-08-26 NOTE — Progress Notes (Signed)
Reviewed and agree with management plan.  Livy Ross T. Sandrina Heaton, MD FACG 

## 2014-08-30 ENCOUNTER — Other Ambulatory Visit: Payer: Self-pay

## 2014-08-31 ENCOUNTER — Encounter: Payer: Self-pay | Admitting: Internal Medicine

## 2014-08-31 ENCOUNTER — Ambulatory Visit (INDEPENDENT_AMBULATORY_CARE_PROVIDER_SITE_OTHER): Payer: Medicare Other | Admitting: Internal Medicine

## 2014-08-31 VITALS — BP 128/80 | HR 78 | Temp 98.0°F | Ht 64.0 in | Wt 159.5 lb

## 2014-08-31 DIAGNOSIS — E039 Hypothyroidism, unspecified: Secondary | ICD-10-CM

## 2014-08-31 DIAGNOSIS — G4700 Insomnia, unspecified: Secondary | ICD-10-CM

## 2014-08-31 DIAGNOSIS — E119 Type 2 diabetes mellitus without complications: Secondary | ICD-10-CM | POA: Diagnosis not present

## 2014-08-31 DIAGNOSIS — M810 Age-related osteoporosis without current pathological fracture: Secondary | ICD-10-CM | POA: Diagnosis not present

## 2014-08-31 MED ORDER — INSULIN GLARGINE 100 UNIT/ML SOLOSTAR PEN: PEN_INJECTOR | SUBCUTANEOUS | Status: DC

## 2014-08-31 MED ORDER — ZOLPIDEM TARTRATE 10 MG PO TABS: 5.0000 mg | ORAL_TABLET | Freq: Every evening | ORAL | Status: DC | PRN

## 2014-08-31 NOTE — Assessment & Plan Note (Signed)
Osteopenia, last bone density test 01-2014, on Prolia, next DEXA in 2  Years

## 2014-08-31 NOTE — Progress Notes (Signed)
Subjective:    Patient ID: Sharon Armstrong, female    DOB: 1940/06/03, 74 y.o.   MRN: [id number]  DOS:  08/31/2014 Type of visit - description : rov  since the last office visit, she saw her PCP in [place], available records reviewed:  Labs from 04/07/2014 Creatinine 0.7, potassium 4.6 TSH 3.09 A1c 6.8  Labs from 08/03/2014 Cholesterol 200, HDL 47, LDL 123 Potassium 4.4, creatinine 0.8, calcium 10.6 A1c 8.1  TSH 3.09  Had chest pain, workup: Echocardiogram 04/12/2014: EF 70%, mild aortic valve stenosis, trivial  mitral regurgitation, trivial tricuspid regurgitation.  Had severe back pain, 07-2014, CT lumbar without contrast showed DJD. Status post steroids, feeling better Apparently also had a CT chest, results?  Also, she recently saw GI: Diagnosed with mild pancreatitis few days ago, MRI of the abdomen pending, GI note reviewed  Diabetes, good compliance of medication, ambulatory CBGs improving lately Pain control, currently on tramadol She was taking gabapentin for edema, it causes side effects, edema. Self discontinued. Hypothyroidism, good medication compliance.   Review of Systems   Past Medical History  Diagnosis Date  . Diabetes mellitus   . Hypertension   . Anxiety   . Aortic valve insufficiency     mild  . Hyperlipemia   . Hypothyroidism   . Elevated liver function tests     fatty liver per CT 2009  . Diverticulosis of colon   . Osteoarthritis   . Cervical stenosis of spine     sees neuro surgery and dr.ramons s/p local injection 4/09  . Breast cancer 04/2007    s/p XRT (last 4/09)  . Urticaria 06/2009  . 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 Adenomatous polyps 12/23/1998  . Hemorrhoids   . Restless leg syndrome   . Difficulty in urination     pt uses cath at times  . Chronic constipation   . Full body hives     if patient does not take Fexofenadine  . Colon polyps 02/2010    Tubular  Adenomas                                     .  Insomnia   . Mitral valve regurgitation   . GERD (gastroesophageal reflux disease)   . Fatty liver   . Cervical myelopathy     Past Surgical History  Procedure Laterality Date  . Breast lumpectomy Left 2009    left breast  . Appendectomy    . Cataract extraction, bilateral  2/11  . Sinus cyst removed    . Eye surgery    . Cardiac catheterization  2001  . Anterior cervical decomp/discectomy fusion N/A 01/08/2013    Procedure: Cervical three-four, four-five, five-six anterior cervical decompression with fusion plating and bonegraft;  Surgeon: Eustace Moore, MD;  Location: [facility];  Service: Neurosurgery;  Laterality: N/A;  Cervical three-four, four-five, five-six anterior cervical decompression with fusion plating and bonegraft  . Ganglion cyst excision Right     wrist  . Uterine fibroid surgery      History   Social History  . Marital Status: Married    Spouse Name: N/A  . Number of Children: 2  . Years of Education: N/A   Occupational History  . custom decorator    Social History Main Topics  . Smoking status: Never Smoker   . Smokeless tobacco: Never Used  .  Alcohol Use: 0.0 oz/week    0 Standard drinks or equivalent per week     Comment: rare  . Drug Use: No  . Sexual Activity: Not on file   Other Topics Concern  . Not on file   Social History Narrative   Lives in [place] half of the time            Medication List       This list is accurate as of: 08/31/14  2:34 PM.  Always use your most recent med list.               carbidopa-levodopa 25-250 MG per disintegrating tablet  Commonly known as:  PARCOPA  Take 1 tablet by mouth at bedtime.     estradiol 0.1 MG/GM vaginal cream  Commonly known as:  ESTRACE  Place 1 Applicatorful vaginally as needed.     gabapentin 300 MG capsule  Commonly known as:  NEURONTIN  Take 600 mg by mouth at bedtime.     glucose blood test strip  Commonly known as:  ONE TOUCH TEST STRIPS  Check once daily.      Insulin Glargine 100 UNIT/ML Solostar Pen  Commonly known as:  LANTUS SOLOSTAR  Inject 38 units into the skin daily .     levothyroxine 100 MCG tablet  Commonly known as:  SYNTHROID, LEVOTHROID  Take 1 tablet (100 mcg total) by mouth daily.     lidocaine 5 %  Commonly known as:  LIDODERM  Place 1 patch onto the skin as needed. Remove & Discard patch within 12 hours or as directed by MD     lisinopril 10 MG tablet  Commonly known as:  PRINIVIL,ZESTRIL  TAKE 1 TABLET (10 MG TOTAL) BY MOUTH DAILY.     meloxicam 7.5 MG tablet  Commonly known as:  MOBIC  Take 1 tablet (7.5 mg total) by mouth daily.     metFORMIN 500 MG tablet  Commonly known as:  GLUCOPHAGE  Take 1,000 mg by mouth 2 (two) times daily with a meal.     omeprazole 20 MG capsule  Commonly known as:  PRILOSEC  Take 20 mg by mouth daily.     ONETOUCH DELICA LANCETS Misc  Check once daily.     sitaGLIPtin 50 MG tablet  Commonly known as:  JANUVIA  Take 50 mg by mouth daily.     traMADol 50 MG tablet  Commonly known as:  ULTRAM  TAKE 1 TABLET BY MOUTH 4 TIMES A DAY AS NEEDED FOR PAIN     triamterene-hydrochlorothiazide 75-50 MG per tablet  Commonly known as:  MAXZIDE  Take 0.5 tablets by mouth daily.     zolpidem 10 MG tablet  Commonly known as:  AMBIEN  Take 5 mg by mouth at bedtime as needed for sleep.           Objective:   Physical Exam BP 128/80 mmHg  Pulse 78  Temp(Src) 98 F (36.7 C) (Oral)  Ht 5\' 4"  (1.626 m)  Wt 159 lb 8 oz (72.349 kg)  BMI 27.36 kg/m2  SpO2 98% General:   Well developed, well nourished . NAD.  HEENT:  Normocephalic . Face symmetric, atraumatic Lungs:  CTA B Normal respiratory effort, no intercostal retractions, no accessory muscle use. Heart: RRR,  no murmur.  Abdomen:  Not distended, soft, non-tender. No rebound or rigidity. No mass,organomegaly Muscle skeletal: no pretibial edema bilaterally  Skin: Not pale. Not jaundice Neurologic:  alert & oriented X3.  Speech normal, gait appropriate for age and unassisted Psych--  Cognition and judgment appear intact.  Cooperative with normal attention span and concentration.  Behavior appropriate. No anxious or depressed appearing.       Assessment & Plan:      Hypothyroidism, Good compliance with medications, last TSH satisfactory  History of pancreatitis, getting better, abdominal exam negative, MRI pending   Today , I spent more than  25  min with the patient: >50% of the time counseling regards   Appropriate diet and also reviewing the chart and labs ordered by other providers

## 2014-08-31 NOTE — Patient Instructions (Signed)
Continue with the same medications Come back fasting in 2 months for a routine visit  Continue watching your diet and exercise

## 2014-08-31 NOTE — Assessment & Plan Note (Signed)
Well controlled on Ambien, refill as neded

## 2014-08-31 NOTE — Assessment & Plan Note (Signed)
Diabetes, Currently on Lantus 38 units, metformin twice a day and Januvia. CBGs in the morning where usually 140-190 but in the last few days has changed her diet and they are now 105-170. At night, CBGs range from 160-200, no recent CBGs. Last A1c elevated, see history of present illness. At this point will recommend to continue the same medicines and follow-up in 2 months, I think  Her improved diet (due to GERD)  will have a large impact on her CBGs. We also  Agreed to refer her to a nutritionist

## 2014-09-02 ENCOUNTER — Other Ambulatory Visit: Payer: Self-pay | Admitting: Physician Assistant

## 2014-09-02 ENCOUNTER — Ambulatory Visit (HOSPITAL_COMMUNITY)
Admission: RE | Admit: 2014-09-02 | Discharge: 2014-09-02 | Disposition: A | Discharge status: Home / Self Care | Payer: Medicare Other | Source: Ambulatory Visit | Attending: Physician Assistant | Admitting: Physician Assistant

## 2014-09-02 DIAGNOSIS — R590 Localized enlarged lymph nodes: Secondary | ICD-10-CM | POA: Diagnosis not present

## 2014-09-02 DIAGNOSIS — K858 Other acute pancreatitis without necrosis or infection: Secondary | ICD-10-CM

## 2014-09-02 DIAGNOSIS — K760 Fatty (change of) liver, not elsewhere classified: Secondary | ICD-10-CM | POA: Insufficient documentation

## 2014-09-02 DIAGNOSIS — K7689 Other specified diseases of liver: Secondary | ICD-10-CM | POA: Diagnosis not present

## 2014-09-02 DIAGNOSIS — K7460 Unspecified cirrhosis of liver: Secondary | ICD-10-CM | POA: Diagnosis not present

## 2014-09-02 DIAGNOSIS — R1010 Upper abdominal pain, unspecified: Secondary | ICD-10-CM | POA: Diagnosis present

## 2014-09-02 DIAGNOSIS — N281 Cyst of kidney, acquired: Secondary | ICD-10-CM | POA: Diagnosis not present

## 2014-09-02 DIAGNOSIS — Z853 Personal history of malignant neoplasm of breast: Secondary | ICD-10-CM | POA: Diagnosis not present

## 2014-09-02 DIAGNOSIS — R9389 Abnormal findings on diagnostic imaging of other specified body structures: Secondary | ICD-10-CM

## 2014-09-02 MED ORDER — GADOBENATE DIMEGLUMINE 529 MG/ML IV SOLN
15.0000 mL | Freq: Once | INTRAVENOUS | Status: AC | PRN
  Administered 2014-09-02: 15 mL via INTRAVENOUS

## 2014-09-08 ENCOUNTER — Encounter: Payer: Self-pay | Admitting: Gastroenterology

## 2014-09-08 ENCOUNTER — Ambulatory Visit (INDEPENDENT_AMBULATORY_CARE_PROVIDER_SITE_OTHER): Payer: Medicare Other | Admitting: Gastroenterology

## 2014-09-08 VITALS — BP 128/60 | HR 80 | Ht 64.0 in | Wt 160.4 lb

## 2014-09-08 DIAGNOSIS — R935 Abnormal findings on diagnostic imaging of other abdominal regions, including retroperitoneum: Secondary | ICD-10-CM

## 2014-09-08 DIAGNOSIS — K219 Gastro-esophageal reflux disease without esophagitis: Secondary | ICD-10-CM | POA: Diagnosis not present

## 2014-09-08 DIAGNOSIS — R0789 Other chest pain: Secondary | ICD-10-CM | POA: Diagnosis not present

## 2014-09-08 DIAGNOSIS — Z86010 Personal history of colonic polyps: Secondary | ICD-10-CM

## 2014-09-08 DIAGNOSIS — K760 Fatty (change of) liver, not elsewhere classified: Secondary | ICD-10-CM

## 2014-09-08 DIAGNOSIS — Z860101 Personal history of adenomatous and serrated colon polyps: Secondary | ICD-10-CM

## 2014-09-08 MED ORDER — OMEPRAZOLE 40 MG PO CPDR: 40.0000 mg | DELAYED_RELEASE_CAPSULE | Freq: Every day | ORAL | Status: DC

## 2014-09-08 NOTE — Patient Instructions (Addendum)
Increase your omeprazole 20 mg tablets to take 2 tablets by mouth daily and you have finished those tablets. A new prescription for omeprazole 40 mg daily was sent to your pharmacy.   You have been scheduled for a colonoscopy. Please follow written instructions given to you at your visit today.  Please pick up your prep supplies at the pharmacy within the next 1-3 days. If you use inhalers (even only as needed), please bring them with you on the day of your procedure. Your physician has requested that you go to www.startemmi.com and enter the access code given to you at your visit today. This web site gives a general overview about your procedure. However, you should still follow specific instructions given to you by our office regarding your preparation for the procedure.   Patient advised to avoid spicy, acidic, citrus, chocolate, mints, fruit and fruit juices.  Limit the intake of caffeine, alcohol and Soda.  Don't exercise too soon after eating.  Don't lie down within 3-4 hours of eating.  Elevate the head of your bed.  You have been given a Low fat diet.   Thank you for choosing me and [facility].  Pricilla Riffle. Dagoberto Ligas., MD., Marval Regal

## 2014-09-08 NOTE — Progress Notes (Signed)
    History of Present Illness: This is a 74 year old female accompanied by her husband. Following her most recent office visit her abdominal pain had abated and her amylase and lipase have returned to normal. She has persistent mild elevation of her AST and ALT. She relates ongoing problems with morning nausea and reflux symptoms. Her reflux symptoms have improved on omeprazole 20 mg daily but they persist. She has constant mid chest pain that radiates through to her back but does not change with meals or with her reflux symptoms. We reviewed findings from her recent MRI.  MRI/MRCP IMPRESSION: 1. 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 There is also diffuse fatty infiltration of the liver. 2. No overt biliary dilatation are filling defect or irregularity in the biliary tree. 3. Dilated side branches from the dorsal pancreatic duct suggesting residua of chronic calcific pancreatitis. No current peripancreatic edema or abnormal pancreatic parenchymal enhancement. 4. Multiple small hepatic cysts. 5. A mildly enlarged peripancreatic lymph node between the pancreatic head in the liver is probably reactive secondary to the underlying cirrhosis.   Current Medications, Allergies, Past Medical History, Past Surgical History, Family History and Social History were reviewed in [employer] record.  Physical Exam: General: Well developed , well nourished, no acute distress Head: Normocephalic and atraumatic Eyes:  sclerae anicteric, EOMI Ears: Normal auditory acuity Mouth: No deformity or lesions Lungs: Clear throughout to auscultation Heart: Regular rate and rhythm; no murmurs, rubs or bruits Abdomen: Soft, non tender and non distended. No masses, hepatosplenomegaly or hernias noted. Normal Bowel sounds Musculoskeletal: Symmetrical with no gross deformities  Pulses:  Normal pulses  noted Extremities: No clubbing, cyanosis, edema or deformities noted Neurological: Alert oriented x 4, grossly nonfocal Psychological:  Alert and cooperative. Normal mood and affect  Assessment and Recommendations:  1. GERD. Increase omeprazole to 40 mg daily and follow all antireflux measures. Call if symptoms not improved.  2. Personal history of adenomatous colon polyps. She is due for surveillance colonoscopy and this is scheduled in May. The risks (including bleeding, perforation, infection, missed lesions, medication reactions and possible hospitalization or surgery if complications occur), benefits, and alternatives to colonoscopy with possible biopsy and possible polypectomy were discussed with the patient and they consent to proceed.   3. Recent acute pancreatitis. Chronic pancreatitis on MRI. Detailed discussion on chronic pancreatitis and acute pancreatitis. No obvious risk factors. Denies alcohol usage.  4. Fatty liver, possible hepatic fibrosis or early cirrhosis. Optimal control of blood sugars and lipids. Long-term low fat diet and weight loss to her optimal body weight is strongly recommended. Monitor LFTs about every 6 months.  5. Mid chest pain radiating to the back. No clear GI cause. Further follow-up with Dr. Larose Kells.  Spent 25 minutes of face-to-face time with the patient. Written 50% of the time was counseling and coordinating care.

## 2014-09-10 ENCOUNTER — Telehealth: Payer: Self-pay | Admitting: Internal Medicine

## 2014-09-10 NOTE — Telephone Encounter (Signed)
LMOM informing Pt to return call.  

## 2014-09-10 NOTE — Telephone Encounter (Signed)
The patient takes Lantus 38 units, metformin and Januvia. A blood sugar of 64 is low, if her blood sugars are consistently low, we need to adjust her Lantus. Continue checking if possible 2 or 3 times a day and let me know. As far as the nutrition, she was already referred to a nutritionist. Recommend to carry orange juice or a package of sugar with her in case she feels her blood sugar drops.

## 2014-09-10 NOTE — Telephone Encounter (Signed)
Please advise 

## 2014-09-10 NOTE — Telephone Encounter (Signed)
Relation to pt: self  Call back number: [phone] / [phone] (pt states try both numbers)   Reason for call:  Pt states she was diagnosed with chronic pancreatitis and would like to discuss diet pt stated her sugar levels were off prior to bed last night. BS 64 (in need of clinical advice)

## 2014-09-10 NOTE — Telephone Encounter (Signed)
Spoke with Pt, informed her of Dr. Paz recommendations. Pt verbalized understanding.  

## 2014-09-16 ENCOUNTER — Telehealth: Payer: Self-pay | Admitting: Gastroenterology

## 2014-09-16 ENCOUNTER — Other Ambulatory Visit: Payer: Self-pay

## 2014-09-16 NOTE — Telephone Encounter (Signed)
Left message for patient to call back  

## 2014-09-17 MED ORDER — ONDANSETRON HCL 4 MG PO TABS: 4.0000 mg | ORAL_TABLET | Freq: Three times a day (TID) | ORAL | Status: DC | PRN

## 2014-09-17 NOTE — Telephone Encounter (Signed)
Patient wants to schedule an office visit to come back to discuss questions she has about her chronic pancreatitis.  Patient is upset that she has to wait to come in until 10/20/14. She was seen in the office on 09/08/14 and was given her diagnosis by Dr. Fuller Plan.  She states that since leaving the office she and her husband have many questions that they want to have answered and don't want to wait until 10/20/14. She is not having any acute pain, nausea, or vomiting. She does report that she has occasional am nausea and vomiting. She and her husband want to discuss with Dr. Fuller Plan her long term prognosis, diet, additional testing or procedure that will need to be done.  I reviewed with her that as long as she is not having any acute symptoms I will put her in with his first appt on 10/20/14.  I explained that Dr. Fuller Plan is not in the office on a daily basis that he rotates between the endoscopy center and the office. She asks to see Nicoletta Ba PA earlier, I explained that Amy is for acute patients and that if she is not having any current problems she should see Dr. Fuller Plan to get all of her questions answered.   I have placed her on the cancellation list and explained that she is welcome to call and check on cancellations.  I have asked that she write down all her questions that she comes up with and bring them with her to the appt for 10/20/14. She became tearful and wanted to know "how long I have".  I explained that this is a chronic condition that may have many months between flares.  She is advised that she should try to follow a low fat diet and that if she has an acute episode of pain or vomiting she should remain on a clear liquid diet until her symptoms resolve and notify the office.  I spoke with Dr. Fuller Plan and he has ordered zofran 4 mg q 8 hours prn nausea and vomiting for her to have on hand for her nausea and vomiting episodes.  She will keep the appt for 10/20/14, she will call back for any additional questions or  concerns.

## 2014-09-24 ENCOUNTER — Encounter: Payer: Medicare Other | Attending: Internal Medicine | Admitting: Dietician

## 2014-09-24 VITALS — Ht 64.0 in | Wt 159.0 lb

## 2014-09-24 DIAGNOSIS — E119 Type 2 diabetes mellitus without complications: Secondary | ICD-10-CM | POA: Diagnosis not present

## 2014-09-24 DIAGNOSIS — Z794 Long term (current) use of insulin: Secondary | ICD-10-CM | POA: Insufficient documentation

## 2014-09-24 DIAGNOSIS — Z713 Dietary counseling and surveillance: Secondary | ICD-10-CM | POA: Diagnosis not present

## 2014-09-24 NOTE — Progress Notes (Signed)
  Medical Nutrition Therapy:  Appt start time: 2202 end time:  5427.   Assessment:  Primary concerns today: Patient is here with her husband.  She would like to learn better nutrition for control of type 2 diabetes and concerns of chronic pancreatitis.  Hx also includes GERD, h/o breast cancer with XRT, hypothyroid, and fatty liver.  Patient takes Milk Thistle for this.  She is concerned about her weight loss in light of chronic pancreatitis.  She has made dietary changes that would result in weight loss.  If this persists patient to discuss with MD.  She is interested in nutritional supplements to add to her diet to improve calorie and protein intake.  169 lbs about 1 month ago and now decreased to 159 lbs.  HgbA1C 8.1% (08/03/14) and reports lower HgbA1C after 1 month trip to [place] in August 2015. Increased walking during that time.    Patient lives with husband.  Both shop and cook.  They are retired and winter in [place].  They lived in [place] in the past.   Preferred Learning Style:   No preference indicated   Learning Readiness:   Ready  Change in progress   MEDICATIONS: see list which includes lantus solostar, glucophage, januvia.   DIETARY INTAKE: Stopped drinking diet soda 24-hr recall:  B ( AM): 1 packet instant oatmeal with blueberries or raspberries and 1% milk or cereal or scrambled egg or omelet with veges and 1 slice toast and fruit Snk ( AM):  Lite yogurt L ( PM):  Leftovers or sandwich or soup with fruit,  Snk ( PM): pretzels D ( PM):  Pork chop with potato, rice, or pasta, vegetable, fruit Snk ( PM): ice cream Beverages:  Water, mineral water, 1% milk, unsweetened tea,   Usual physical activity: none  Estimated energy needs: 1600 calories 180 g carbohydrates 100 g protein 45-55 g fat  Progress Towards Goal(s):  In progress.   Nutritional Diagnosis:  NB-1.1 Food and nutrition-related knowledge deficit As related to diet for diabetes and pancreatitis.  As  evidenced by patient report.    Intervention:  Nutrition Counseling/Education related to diabetes management and chronic pancreatitis.  Patient never uses etoh.  Discussed a carb controlled diet with balance of carbohydrates throughout the day along with a low fat diet.  Patient has a moderate understanding of carbohydrate counting.  Discussed nutrition supplement options to increase calories and protein (Glucerna, Boost Glucose Control as options)  Make low fat food choices.  (Total fat goal <50 grams per day). Aim for 3 Carb Choices per meal (45 grams) +/- 1 either way  Aim for 0-1 Carbs per snack if hungry  Include protein in moderation with your meals and snacks Consider reading food labels for Total Carbohydrate and Fat Grams of foods Consider  increasing your activity level by walking for 30 minutes daily as tolerated Consider checking BG at alternate times per day as directed by MD  Consider taking medication as directed by MD  Teaching Method Utilized:  Visual Auditory Hands on  Handouts given during visit include:  Meal plan card  Snack list  Nutrition therapy for pancreatitis   Coupons for calorie/protein nutritional supplements  Barriers to learning/adherence to lifestyle change: none  Demonstrated degree of understanding via:  Teach Back   Monitoring/Evaluation:  Dietary intake, exercise, label reading, and body weight in 4 week(s).

## 2014-09-24 NOTE — Patient Instructions (Signed)
Make low fat food choices.  (Total fat goal <50 grams per day). Aim for 3 Carb Choices per meal (45 grams) +/- 1 either way  Aim for 0-1 Carbs per snack if hungry  Include protein in moderation with your meals and snacks Consider reading food labels for Total Carbohydrate and Fat Grams of foods Consider  increasing your activity level by walking for 30 minutes daily as tolerated Consider checking BG at alternate times per day as directed by MD  Consider taking medication as directed by MD

## 2014-09-28 ENCOUNTER — Telehealth: Payer: Self-pay | Admitting: *Deleted

## 2014-09-28 ENCOUNTER — Encounter: Payer: Self-pay | Admitting: Internal Medicine

## 2014-09-28 ENCOUNTER — Ambulatory Visit (INDEPENDENT_AMBULATORY_CARE_PROVIDER_SITE_OTHER): Payer: Medicare Other | Admitting: Internal Medicine

## 2014-09-28 VITALS — BP 122/64 | HR 71 | Temp 97.7°F | Ht 64.0 in | Wt 154.4 lb

## 2014-09-28 DIAGNOSIS — K861 Other chronic pancreatitis: Secondary | ICD-10-CM | POA: Diagnosis not present

## 2014-09-28 DIAGNOSIS — E119 Type 2 diabetes mellitus without complications: Secondary | ICD-10-CM | POA: Diagnosis not present

## 2014-09-28 DIAGNOSIS — K760 Fatty (change of) liver, not elsewhere classified: Secondary | ICD-10-CM | POA: Diagnosis not present

## 2014-09-28 LAB — URINALYSIS, ROUTINE W REFLEX MICROSCOPIC
Bilirubin Urine: NEGATIVE
HGB URINE DIPSTICK: NEGATIVE
Ketones, ur: NEGATIVE
LEUKOCYTES UA: NEGATIVE
NITRITE: NEGATIVE
RBC / HPF: NONE SEEN (ref 0–?)
Specific Gravity, Urine: >=1.03 — AB (ref 1.000–1.030)
Total Protein, Urine: NEGATIVE
UROBILINOGEN UA: 0.2 (ref 0.0–1.0)
Urine Glucose: 500 — AB
WBC UA: NONE SEEN (ref 0–?)
pH: 5.5 (ref 5.0–8.0)

## 2014-09-28 NOTE — Progress Notes (Signed)
Pre visit review using our clinic review tool, if applicable. No additional management support is needed unless otherwise documented below in the visit note. 

## 2014-09-28 NOTE — Telephone Encounter (Signed)
Patient states that her blood sugars have been dropping (noted in previous phone notes).  Her CBG was in the 90's last night before bed and 74 fasting this morning.  She states that she does feel shaky and does carry around pills to take if CBG is low.  She reports that she stopped taking her Januvia, but is still taking the 38 units of Lantus.   Appointment scheduled for 1:00 today with Dr. Larose Kells.

## 2014-09-28 NOTE — Telephone Encounter (Signed)
Seen today. 

## 2014-09-28 NOTE — Patient Instructions (Addendum)
Go to the lab for a urine test  Stop januvia  lantus-- take only 32 units  Call with your sugar readings in one week, sooner if consistently less than 95  Next visit in 3 months    Hypoglycemia Hypoglycemia occurs when the glucose in your blood is too low. Glucose is a type of sugar that is your body's main energy source. Hormones, such as insulin and glucagon, control the level of glucose in the blood. Insulin lowers blood glucose and glucagon increases blood glucose. Having too much insulin in your blood stream, or not eating enough food containing sugar, can result in hypoglycemia. Hypoglycemia can happen to people with or without diabetes. It can develop quickly and can be a medical emergency.  CAUSES   Missing or delaying meals.  Not eating enough carbohydrates at meals.  Taking too much diabetes medicine.  Not timing your oral diabetes medicine or insulin doses with meals, snacks, and exercise.  Nausea and vomiting.  Certain medicines.  Severe illnesses, such as hepatitis, kidney disorders, and certain eating disorders.  Increased activity or exercise without eating something extra or adjusting medicines.  Drinking too much alcohol.  A nerve disorder that affects body functions like your heart rate, blood pressure, and digestion (autonomic neuropathy).  A condition where the stomach muscles do not function properly (gastroparesis). Therefore, medicines and food may not absorb properly.  Rarely, a tumor of the pancreas can produce too much insulin. SYMPTOMS   Hunger.  Sweating (diaphoresis).  Change in body temperature.  Shakiness.  Headache.  Anxiety.  Lightheadedness.  Irritability.  Difficulty concentrating.  Dry mouth.  Tingling or numbness in the hands or feet.  Restless sleep or sleep disturbances.  Altered speech and coordination.  Change in mental status.  Seizures or prolonged convulsions.  Combativeness.  Drowsiness  (lethargic).  Weakness.  Increased heart rate or palpitations.  Confusion.  Pale, gray skin color.  Blurred or double vision.  Fainting. DIAGNOSIS  A physical exam and medical history will be performed. Your caregiver may make a diagnosis based on your symptoms. Blood tests and other lab tests may be performed to confirm a diagnosis. Once the diagnosis is made, your caregiver will see if your signs and symptoms go away once your blood glucose is raised.  TREATMENT  Usually, you can easily treat your hypoglycemia when you notice symptoms.  Check your blood glucose. If it is less than 70 mg/dl, take one of the following:   3-4 glucose tablets.    cup juice.    cup regular soda.   1 cup skim milk.   -1 tube of glucose gel.   5-6 hard candies.   Avoid high-fat drinks or food that may delay a rise in blood glucose levels.  Do not take more than the recommended amount of sugary foods, drinks, gel, or tablets. Doing so will cause your blood glucose to go too high.   Wait 10-15 minutes and recheck your blood glucose. If it is still less than 70 mg/dl or below your target range, repeat treatment.   Eat a snack if it is more than 1 hour until your next meal.  There may be a time when your blood glucose may go so low that you are unable to treat yourself at home when you start to notice symptoms. You may need someone to help you. You may even faint or be unable to swallow. If you cannot treat yourself, someone will need to bring you to the hospital.  HOME CARE INSTRUCTIONS  If you have diabetes, follow your diabetes management plan by:  Taking your medicines as directed.  Following your exercise plan.  Following your meal plan. Do not skip meals. Eat on time.  Testing your blood glucose regularly. Check your blood glucose before and after exercise. If you exercise longer or different than usual, be sure to check blood glucose more frequently.  Wearing your  medical alert jewelry that says you have diabetes.  Identify the cause of your hypoglycemia. Then, develop ways to prevent the recurrence of hypoglycemia.  Do not take a hot bath or shower right after an insulin shot.  Always carry treatment with you. Glucose tablets are the easiest to carry.  If you are going to drink alcohol, drink it only with meals.  Tell friends or family members ways to keep you safe during a seizure. This may include removing hard or sharp objects from the area or turning you on your side.  Maintain a healthy weight. SEEK MEDICAL CARE IF:   You are having problems keeping your blood glucose in your target range.  You are having frequent episodes of hypoglycemia.  You feel you might be having side effects from your medicines.  You are not sure why your blood glucose is dropping so low.  You notice a change in vision or a new problem with your vision. SEEK IMMEDIATE MEDICAL CARE IF:   Confusion develops.  A change in mental status occurs.  The inability to swallow develops.  Fainting occurs. Document Released: 05/07/2005 Document Revised: 05/12/2013 Document Reviewed: 09/03/2011 [facility] Patient Information 2015 [place], [place]. This information is not intended to replace advice given to you by your health care provider. Make sure you discuss any questions you have with your health care provider.

## 2014-09-28 NOTE — Progress Notes (Signed)
Subjective:    Patient ID: Sharon Armstrong, female    DOB: 05/06/1941, 74 y.o.   MRN: [id number]  DOS:  09/28/2014 Type of visit - description : acute Interval history: Diabetes, she self discontinue Januvia around 08/11/2014 because she read about potential problems with the pancreas. Otherwise good compliance with diabetes medication, blood sugar is falling in the low side associated with symptoms. In the morning is usually in the low 110s and in the last few days a couple of times have been 88 and 76 At bedtime is a little higher but at least one time has been 94.   Review of Systems Denies nausea, vomiting at this point. No abdominal pain. Does have nausea medication in case she needs it No dysuria, gross hematuria difficulty urinating  Past Medical History  Diagnosis Date  . Diabetes mellitus   . Hypertension   . Anxiety   . Aortic valve insufficiency     mild  . Hyperlipemia   . Hypothyroidism   . Elevated liver function tests     fatty liver per CT 2009  . Diverticulosis of colon   . Osteoarthritis   . Cervical stenosis of spine     sees neuro surgery and dr.ramons s/p local injection 4/09  . Breast cancer 04/2007    s/p XRT (last 4/09)  . Urticaria 06/2009  . 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 Adenomatous polyps Dec 29, 1998  . Hemorrhoids   . Restless leg syndrome   . Difficulty in urination     pt uses cath at times  . Chronic constipation   . Full body hives     if patient does not take Fexofenadine  . Colon polyps 02/2010    Tubular  Adenomas                                     . Insomnia   . Mitral valve regurgitation   . GERD (gastroesophageal reflux disease)   . Fatty liver   . Cervical myelopathy     Past Surgical History  Procedure Laterality Date  . Breast lumpectomy Left 2009    left breast  . Appendectomy    . Cataract extraction, bilateral  2/11  . Sinus cyst removed    . Eye surgery    . Cardiac catheterization  2001  .  Anterior cervical decomp/discectomy fusion N/A 01/08/2013    Procedure: Cervical three-four, four-five, five-six anterior cervical decompression with fusion plating and bonegraft;  Surgeon: Eustace Moore, MD;  Location: [facility];  Service: Neurosurgery;  Laterality: N/A;  Cervical three-four, four-five, five-six anterior cervical decompression with fusion plating and bonegraft  . Ganglion cyst excision Right     wrist  . Uterine fibroid surgery      History   Social History  . Marital Status: Married    Spouse Name: N/A  . Number of Children: 2  . Years of Education: N/A   Occupational History  . custom decorator    Social History Main Topics  . Smoking status: Never Smoker   . Smokeless tobacco: Never Used  . Alcohol Use: 0.0 oz/week    0 Standard drinks or equivalent per week     Comment: rare  . Drug Use: No  . Sexual Activity: Not on file   Other Topics Concern  . Not on file   Social History Narrative  Lives in [place] half of the time            Medication List       This list is accurate as of: 09/28/14 11:59 PM.  Always use your most recent med list.               carbidopa-levodopa 25-250 MG per disintegrating tablet  Commonly known as:  PARCOPA  Take 1 tablet by mouth at bedtime.     estradiol 0.1 MG/GM vaginal cream  Commonly known as:  ESTRACE  Place 1 Applicatorful vaginally as needed.     glucose blood test strip  Commonly known as:  ONE TOUCH TEST STRIPS  Check once daily.     insulin glargine 100 UNIT/ML injection  Commonly known as:  LANTUS  Inject 32 Units into the skin at bedtime.     levothyroxine 100 MCG tablet  Commonly known as:  SYNTHROID, LEVOTHROID  Take 1 tablet (100 mcg total) by mouth daily.     lidocaine 5 %  Commonly known as:  LIDODERM  Place 1 patch onto the skin as needed. Remove & Discard patch within 12 hours or as directed by MD     lisinopril 10 MG tablet  Commonly known as:  PRINIVIL,ZESTRIL  TAKE 1  TABLET (10 MG TOTAL) BY MOUTH DAILY.     metFORMIN 500 MG tablet  Commonly known as:  GLUCOPHAGE  Take 1,000 mg by mouth 2 (two) times daily with a meal.     milk thistle 175 MG tablet  Take 175 mg by mouth daily.     omeprazole 40 MG capsule  Commonly known as:  PRILOSEC  Take 1 capsule (40 mg total) by mouth daily.     ondansetron 4 MG tablet  Commonly known as:  ZOFRAN  Take 1 tablet (4 mg total) by mouth every 8 (eight) hours as needed for nausea or vomiting.     ONETOUCH DELICA LANCETS Misc  Check once daily.     traMADol 50 MG tablet  Commonly known as:  ULTRAM  TAKE 1 TABLET BY MOUTH 4 TIMES A DAY AS NEEDED FOR PAIN     triamterene-hydrochlorothiazide 75-50 MG per tablet  Commonly known as:  MAXZIDE  Take 0.5 tablets by mouth daily.     zolpidem 10 MG tablet  Commonly known as:  AMBIEN  Take 0.5 tablets (5 mg total) by mouth at bedtime as needed for sleep.           Objective:   Physical Exam BP 122/64 mmHg  Pulse 71  Temp(Src) 97.7 F (36.5 C) (Oral)  Ht 5\' 4"  (1.626 m)  Wt 154 lb 6 oz (70.024 kg)  BMI 26.49 kg/m2  SpO2 98% General:   Well developed, well nourished . NAD.  HEENT:  Normocephalic . Face symmetric, atraumatic   Speech normal, gait appropriate for age and unassisted Psych--  Cognition and judgment appear intact.  Cooperative with normal attention span and concentration.  Behavior appropriate. No anxious or depressed appearing.        Assessment & Plan:    Today , I spent more than 25   min with the patient: >50% of the time counseling regards  diabetes plan of care, recent MRI, the meaning of fatty liver. Multiple questions answered to the best of my ability

## 2014-09-29 ENCOUNTER — Encounter: Payer: Self-pay | Admitting: Internal Medicine

## 2014-09-29 DIAGNOSIS — K861 Other chronic pancreatitis: Secondary | ICD-10-CM

## 2014-09-29 HISTORY — DX: Other chronic pancreatitis: K86.1

## 2014-09-29 NOTE — Assessment & Plan Note (Signed)
  Has the diagnosis of chronic pancreatitis, not alcohol related, recently see by GI.  MRI from April 2016:  IMPRESSION: 1. 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 There is also diffuse fatty infiltration of the liver. 2. No overt biliary dilatation are filling defect or irregularity in the biliary tree. 3. Dilated side branches from the dorsal pancreatic duct suggesting residua of chronic calcific pancreatitis. No current peripancreatic edema or abnormal pancreatic parenchymal enhancement. 4. Multiple small hepatic cysts. 5. A mildly enlarged peripancreatic lymph node between the pancreatic head in the liver is probably reactive secondary to the underlying cirrhosis.

## 2014-09-29 NOTE — Assessment & Plan Note (Signed)
Diabetes, Last A1c 8.5, now presents with occasional hypoglycemia. She already discontinue Januvia 07-2014 because he was concerned about her pancreas. Currently on Lantus 38, metformin   twice a day. Her diet has improved significantly. Plan: Decrease Lantus to 32 units Endocrinology referral given  low sugars despite recent elevated A1c understanding that the recent low sugars may be due to a better diet. Follow-up here in 3 months Check a UA See AVS

## 2014-09-29 NOTE — Assessment & Plan Note (Addendum)
History of chronic increase LFTs now diagnosed with fatty fever. MRI also suggested hepatic fibrosis. Extensive discussion with the patient   about what fatty liver disease is and what is early cirrhosis.  MRI abdomen 08/2014: IMPRESSION: 1. 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 There is also diffuse fatty infiltration of the liver. 2. No overt biliary dilatation are filling defect or irregularity in the biliary tree. 3. Dilated side branches from the dorsal pancreatic duct suggesting residua of chronic calcific pancreatitis. No current peripancreatic edema or abnormal pancreatic parenchymal enhancement. 4. Multiple small hepatic cysts. 5. A mildly enlarged peripancreatic lymph node between the pancreatic head in the liver is probably reactive secondary to the underlying cirrhosis.

## 2014-10-04 ENCOUNTER — Telehealth: Payer: Self-pay | Admitting: Gastroenterology

## 2014-10-04 ENCOUNTER — Telehealth: Payer: Self-pay | Admitting: Internal Medicine

## 2014-10-04 MED ORDER — GLUCOSE BLOOD VI STRP: ORAL_STRIP | Status: DC

## 2014-10-04 NOTE — Telephone Encounter (Signed)
Caller: Sharon Armstrong, self Ph#: [phone] [facility]  Pt states she need RX sent for AccuCheck Strips to test 3 times per day in order for insurance to cover her increased testing.

## 2014-10-04 NOTE — Telephone Encounter (Signed)
Called patient to inform her to also hold her diabetic oral and insulin medications the morning of her procedure. Pt verbalized understanding.

## 2014-10-04 NOTE — Telephone Encounter (Signed)
Informed patient that she can use water and Crystal Light Lemonade packets to flavor the water and mix the Miralax in that in place of the Gatorade. Pt verbalized understanding.

## 2014-10-04 NOTE — Telephone Encounter (Signed)
[facility] as requested.

## 2014-10-06 ENCOUNTER — Telehealth: Payer: Self-pay | Admitting: Gastroenterology

## 2014-10-06 NOTE — Telephone Encounter (Signed)
Patient advised that she should not just use sugar free products today while prepping for her procedure.  She wants to know if she can hold her metformin tonight because if her "BS continues to drop I sometimes will have a BS in the 60s"  She is advised that if she takes her BS at bedtime and she is uncomfortable with taking her metformin she can hold it.  She wants to know what she can do if her BS drops tonight. She is advised that she should keep clear liquid juice or soda with sugar in it close and can also suck on hard candy.

## 2014-10-07 ENCOUNTER — Ambulatory Visit (AMBULATORY_SURGERY_CENTER): Payer: Medicare Other | Admitting: Gastroenterology

## 2014-10-07 ENCOUNTER — Encounter: Payer: Self-pay | Admitting: Gastroenterology

## 2014-10-07 ENCOUNTER — Other Ambulatory Visit: Payer: Self-pay | Admitting: Gastroenterology

## 2014-10-07 VITALS — BP 105/57 | HR 62 | Temp 96.9°F | Resp 17 | Ht 64.0 in | Wt 154.0 lb

## 2014-10-07 DIAGNOSIS — D125 Benign neoplasm of sigmoid colon: Secondary | ICD-10-CM

## 2014-10-07 DIAGNOSIS — Z86010 Personal history of colonic polyps: Secondary | ICD-10-CM | POA: Diagnosis not present

## 2014-10-07 DIAGNOSIS — E039 Hypothyroidism, unspecified: Secondary | ICD-10-CM | POA: Diagnosis not present

## 2014-10-07 DIAGNOSIS — K635 Polyp of colon: Secondary | ICD-10-CM | POA: Diagnosis not present

## 2014-10-07 DIAGNOSIS — D124 Benign neoplasm of descending colon: Secondary | ICD-10-CM | POA: Diagnosis not present

## 2014-10-07 DIAGNOSIS — R933 Abnormal findings on diagnostic imaging of other parts of digestive tract: Secondary | ICD-10-CM | POA: Diagnosis not present

## 2014-10-07 DIAGNOSIS — E119 Type 2 diabetes mellitus without complications: Secondary | ICD-10-CM | POA: Diagnosis not present

## 2014-10-07 LAB — HM COLONOSCOPY

## 2014-10-07 LAB — GLUCOSE, CAPILLARY
Glucose-Capillary: 94 mg/dL (ref 65–99)
Glucose-Capillary: 98 mg/dL (ref 65–99)

## 2014-10-07 MED ORDER — SODIUM CHLORIDE 0.9 % IV SOLN: 500.0000 mL | INTRAVENOUS | Status: DC

## 2014-10-07 NOTE — Op Note (Signed)
[place]  [address] [place], [id number]   COLONOSCOPY PROCEDURE REPORT  PATIENT: Sharon Armstrong, Sharon Armstrong  MR#: [id number] BIRTHDATE: 12-05-1940 , 33  yrs. old GENDER: female ENDOSCOPIST: Ladene Artist, MD, [facility] PROCEDURE DATE:  10/07/2014 PROCEDURE:   Colonoscopy, surveillance , Colonoscopy with biopsy, and Colonoscopy with snare polypectomy First Screening Colonoscopy - Avg.  risk and is 50 yrs.  old or older - No.  Prior Negative Screening - Now for repeat screening. N/A  History of Adenoma - Now for follow-up colonoscopy & has been > or = to 3 yrs.  Yes hx of adenoma.  Has been 3 or more years since last colonoscopy.  Polyps removed today? Yes ASA CLASS:   Class III INDICATIONS:Surveillance due to prior colonic neoplasia and PH Colon Adenoma. MEDICATIONS: Monitored anesthesia care and Propofol 200 mg IV DESCRIPTION OF PROCEDURE:   After the risks benefits and alternatives of the procedure were thoroughly explained, informed consent was obtained.  The digital rectal exam revealed no abnormalities of the rectum.   The LB CF-H180AL Loaner [id number] endoscope was introduced through the anus and advanced to the cecum, which was identified by both the appendix and ileocecal valve. No adverse events experienced.   The quality of the prep was good.  (Suprep was used)  The instrument was then slowly withdrawn as the colon was fully examined. Estimated blood loss is zero unless otherwise noted in this procedure report.  COLON FINDINGS: A sessile polyp measuring 6 mm in size was found in the descending colon.  A polypectomy was performed with a cold snare.  The resection was complete, the polyp tissue was completely retrieved and sent to histology.   Two sessile polyps measuring 4 mm in size were found in the sigmoid colon.  Polypectomies were performed with cold forceps.  The resection was complete, the polyp tissue was completely retrieved and sent to histology.    There was moderate diverticulosis noted in the sigmoid colon with associated luminal narrowing and muscular hypertrophy.   The examination was otherwise normal.  Retroflexed views revealed internal Grade I hemorrhoids. The time to cecum = 3.4 Withdrawal time = 11.2   The scope was withdrawn and the procedure completed. COMPLICATIONS: There were no immediate complications.  ENDOSCOPIC IMPRESSION: 1.   Sessile polyp in the descending colon; polypectomy performed with a cold snare 2.   Two sessile polyps in the sigmoid colon; polypectomies performed with cold forceps 3.   Moderate diverticulosis in the sigmoid colon 4.   Grade l internal hemorrhoids  RECOMMENDATIONS: 1.  Await pathology results 2.  High fiber diet with liberal fluid intake. 3.  Repeat Colonoscopy in 5 years.  eSigned:  Ladene Artist, MD, [facility] 10/07/2014 11:02 AM

## 2014-10-07 NOTE — Patient Instructions (Signed)
YOU HAD AN ENDOSCOPIC PROCEDURE TODAY AT [facility]:   Refer to the procedure report that was given to you for any specific questions about what was found during the examination.  If the procedure report does not answer your questions, please call your gastroenterologist to clarify.  If you requested that your care partner not be given the details of your procedure findings, then the procedure report has been included in a sealed envelope for you to review at your convenience later.  YOU SHOULD EXPECT: Some feelings of bloating in the abdomen. Passage of more gas than usual.  Walking can help get rid of the air that was put into your GI tract during the procedure and reduce the bloating. If you had a lower endoscopy (such as a colonoscopy or flexible sigmoidoscopy) you may notice spotting of blood in your stool or on the toilet paper. If you underwent a bowel prep for your procedure, you may not have a normal bowel movement for a few days.  Please Note:  You might notice some irritation and congestion in your nose or some drainage.  This is from the oxygen used during your procedure.  There is no need for concern and it should clear up in a day or so.  SYMPTOMS TO REPORT IMMEDIATELY:   Following lower endoscopy (colonoscopy or flexible sigmoidoscopy):  Excessive amounts of blood in the stool  Significant tenderness or worsening of abdominal pains  Swelling of the abdomen that is new, acute  Fever of 100F or higher  For urgent or emergent issues, a gastroenterologist can be reached at any hour by calling [phone].   DIET: Your first meal following the procedure should be a small meal and then it is ok to progress to your normal diet. Heavy or fried foods are harder to digest and may make you feel nauseous or bloated.  Likewise, meals heavy in dairy and vegetables can increase bloating.  Drink plenty of fluids but you should avoid alcoholic beverages for 24  hours.  ACTIVITY:  You should plan to take it easy for the rest of today and you should NOT DRIVE or use heavy machinery until tomorrow (because of the sedation medicines used during the test).    FOLLOW UP: Our staff will call the number listed on your records the next business day following your procedure to check on you and address any questions or concerns that you may have regarding the information given to you following your procedure. If we do not reach you, we will leave a message.  However, if you are feeling well and you are not experiencing any problems, there is no need to return our call.  We will assume that you have returned to your regular daily activities without incident.  If any biopsies were taken you will be contacted by phone or by letter within the next 1-3 weeks.  Please call us at [phone] if you have not heard about the biopsies in 3 weeks.    SIGNATURES/CONFIDENTIALITY: You and/or your care partner have signed paperwork which will be entered into your electronic medical record.  These signatures attest to the fact that that the information above on your After Visit Summary has been reviewed and is understood.  Full responsibility of the confidentiality of this discharge information lies with you and/or your care-partner.  Polyps, diverticulosis, high fiber diet, hemorrhoids-handouts given  Repeat colonoscopy in 5 years-2021.

## 2014-10-07 NOTE — Progress Notes (Signed)
Report to PACU, RN, vss, BBS= Clear.  

## 2014-10-07 NOTE — Progress Notes (Signed)
Called to room to assist during endoscopic procedure.  Patient ID and intended procedure confirmed with present staff. Received instructions for my participation in the procedure from the performing physician.  

## 2014-10-08 ENCOUNTER — Telehealth: Payer: Self-pay | Admitting: *Deleted

## 2014-10-08 NOTE — Telephone Encounter (Signed)
  Follow up Call-  Call back number 10/07/2014  Post procedure Call Back phone  # [phone]  Permission to leave phone message Yes     Patient questions:  Do you have a fever, pain , or abdominal swelling? No. Pain Score  0 *  Have you tolerated food without any problems? Yes.    Have you been able to return to your normal activities? Yes.    Do you have any questions about your discharge instructions: Diet   No. Medications  No. Follow up visit  No.  Do you have questions or concerns about your Care? No.  Actions: * If pain score is 4 or above: No action needed, pain <4.

## 2014-10-13 ENCOUNTER — Encounter: Payer: Self-pay | Admitting: Gastroenterology

## 2014-10-20 ENCOUNTER — Ambulatory Visit (INDEPENDENT_AMBULATORY_CARE_PROVIDER_SITE_OTHER): Payer: Medicare Other | Admitting: Gastroenterology

## 2014-10-20 ENCOUNTER — Other Ambulatory Visit: Payer: Medicare Other

## 2014-10-20 ENCOUNTER — Encounter: Payer: Self-pay | Admitting: Gastroenterology

## 2014-10-20 VITALS — BP 118/60 | HR 72 | Ht 63.75 in | Wt 155.4 lb

## 2014-10-20 DIAGNOSIS — R112 Nausea with vomiting, unspecified: Secondary | ICD-10-CM | POA: Diagnosis not present

## 2014-10-20 DIAGNOSIS — K861 Other chronic pancreatitis: Secondary | ICD-10-CM

## 2014-10-20 DIAGNOSIS — K219 Gastro-esophageal reflux disease without esophagitis: Secondary | ICD-10-CM | POA: Diagnosis not present

## 2014-10-20 MED ORDER — OMEPRAZOLE 40 MG PO CPDR: 40.0000 mg | DELAYED_RELEASE_CAPSULE | Freq: Two times a day (BID) | ORAL | Status: DC

## 2014-10-20 NOTE — Patient Instructions (Addendum)
We have sent the following medications to your pharmacy for you to pick up at your convenience: omeprazole 40 mg one tablet by mouth twice daily.  Your physician has requested that you go to the basement for the following lab work before leaving today:IgG4.  Thank you for choosing me and [facility].  Pricilla Riffle. Dagoberto Ligas., MD., [facility]

## 2014-10-20 NOTE — Progress Notes (Signed)
    History of Present Illness: This is a 74 year old female returning with her husband. She relates frequent episodes of morning vomiting, occurs about every 2 weeks. Symptoms have improved since changing from omeprazole 20 daily to omeprazole 40 daily. She has multiple questions regarding pancreatitis, hepatic steatosis, possible cirrhosis and colon polyps.  Current Medications, Allergies, Past Medical History, Past Surgical History, Family History and Social History were reviewed in [employer] record.  Physical Exam: General: Well developed , well nourished, no acute distress Head: Normocephalic and atraumatic Eyes:  sclerae anicteric, EOMI Ears: Normal auditory acuity Mouth: No deformity or lesions Lungs: Clear throughout to auscultation Heart: Regular rate and rhythm; no murmurs, rubs or bruits Abdomen: Soft, non tender and non distended. No masses, hepatosplenomegaly or hernias noted. Normal Bowel sounds Musculoskeletal: Symmetrical with no gross deformities  Pulses:  Normal pulses noted Extremities: No clubbing, cyanosis, edema or deformities noted Neurological: Alert oriented x 4, grossly nonfocal Psychological:  Alert and cooperative. Normal mood and affect  Assessment and Recommendations:  1. GERD with frequent morning emesis. Increase omeprazole to 40 mg bid and follow all antireflux measures. Call if symptoms not improved in 6-8 weeks.   2. Personal history of adenomatous colon polyps. Results of last colonoscopy reviewed with patient and questions answered. Surveillance colonoscopy recommended in 5 years.  3. Recent acute pancreatitis, resolved. Chronic pancreatitis by MRI. Detailed discussion on chronic pancreatitis and acute pancreatitis. No obvious risk factors. Denies alcohol usage. Advised to continue to avoid alcohol. Addressed and answered her questions. Obtain IgG4 today.  4. Hepatic steatosis, possible hepatic fibrosis or early cirrhosis.  Optimal control of blood sugars and lipids. Long-term low fat diet and weight loss to her optimal body weight is strongly recommended. Monitor LFTs about every 6 months.   Spent 15 minutes of face-to-face time with the patient. Greater than 50% of the time was counseling and coordinating care.

## 2014-10-21 LAB — IGG 4: IgG, Subclass 4: 19 mg/dL (ref 1–291)

## 2014-10-22 ENCOUNTER — Encounter: Payer: Medicare Other | Attending: Internal Medicine | Admitting: Dietician

## 2014-10-22 VITALS — Wt 155.0 lb

## 2014-10-22 DIAGNOSIS — Z713 Dietary counseling and surveillance: Secondary | ICD-10-CM | POA: Insufficient documentation

## 2014-10-22 DIAGNOSIS — K861 Other chronic pancreatitis: Secondary | ICD-10-CM

## 2014-10-22 DIAGNOSIS — Z794 Long term (current) use of insulin: Secondary | ICD-10-CM | POA: Insufficient documentation

## 2014-10-22 DIAGNOSIS — E119 Type 2 diabetes mellitus without complications: Secondary | ICD-10-CM | POA: Diagnosis not present

## 2014-10-22 NOTE — Patient Instructions (Signed)
Be sure to eat 45-60 grams of carbohydrates at each meal. (3-4 carb choices) Boost Glucose control between meals as needed to maintain weight. Continue to be as active as possible.  Aim for 150 minutes of exercise every week. (spread throughout the week) Continue to limit fat intake less than 50 grams.

## 2014-10-22 NOTE — Progress Notes (Signed)
Medical Nutrition Therapy:  Appt start time: 1324 end time:  4010.   Assessment: 5/6: Primary concerns today: Patient is here with her husband.  She would like to learn better nutrition for control of type 2 diabetes and concerns of chronic pancreatitis.  Hx also includes GERD, h/o breast cancer with XRT, hypothyroid, and fatty liver.  Patient takes Milk Thistle for this.  She is concerned about her weight loss in light of chronic pancreatitis.  She has made dietary changes that would result in weight loss.  If this persists patient to discuss with MD.  She is interested in nutritional supplements to add to her diet to improve calorie and protein intake.  169 lbs about 1 month ago and now decreased to 159 lbs.  HgbA1C 8.1% (08/03/14) and reports lower HgbA1C after 1 month trip to [place] in August 2015. Increased walking during that time.    Patient lives with husband and daughter. Both shop and cook.  They are retired and winter in [place].  They lived in [place] in the past.   10/22/14: Patient still reports vomiting on occasion in the morning before breakfast.  She usually tries to raise the head of the bed and this helps.  She also experiences a tight cramping pain that lasts for minutes at times.  She avoids spicy and high fat foods as well as carbonated beverages which has helped.  Patient continues to have concerns about chronic pancreatitis and fatty liver.  She states that her blood sugars have been much better controlled and has decreased insulin to 32 units every am and stopped taking Januvia.   She no longer snacks except in the afternoon unless blood sugar is low before bed.  On occasion it is 60-70.  Morning CBG's of about 100-120 most often.  She has been drinking a Boost Glucose control in the afternoon and feels better since doing this.    Preferred Learning Style:   No preference indicated   Learning Readiness:   Ready  Change in progress   MEDICATIONS: see list which includes  lantus solostar, glucophage, januvia.   DIETARY INTAKE: Stopped drinking diet soda 24-hr recall:  B (10 AM): 1 cup cooked oatmeal with blueberries or raspberries or plain shredded wheatl or scrambled egg or omelet with veges and 1 slice toast and fruit Snk ( AM): none L (12 PM):  Leftovers or sandwich or soup with fruit,  Snk ( PM): Boost or 1/2 cup yogurt D (4-8PM):  Chicken, with potato, rice, or pasta, vegetable, fruit Snk ( PM): only if blood sugar is low Beverages:  Water, mineral water, Fairlife skim milk, unsweetened tea,   Usual physical activity: none  Estimated energy needs: 1600 calories 180 g carbohydrates 100 g protein 45-55 g fat  Progress Towards Goal(s):  In progress.   Nutritional Diagnosis:  NB-1.1 Food and nutrition-related knowledge deficit As related to diet for diabetes and pancreatitis.  As evidenced by patient report.    Intervention:  Nutrition Counseling/Education related to diabetes management, GERD and chronic pancreatitis.  Patient never uses etoh.    Be sure to eat 45-60 grams of carbohydrates at each meal. (3-4 carb choices) Boost Glucose control or Glucerna between meals as needed to maintain weight. Continue to be as active as possible.  Aim for 150 minutes of exercise every week. (spread throughout the week) Continue to limit fat intake less than 50 grams.  Teaching Method Utilized:  Visual Auditory Hands on  Handouts given during visit include:  Meal  plan card  Snack list  Nutrition therapy for pancreatitis   Coupons for calorie/protein nutritional supplements  Barriers to learning/adherence to lifestyle change: none  Demonstrated degree of understanding via:  Teach Back   Monitoring/Evaluation:  Dietary intake, exercise, label reading, and body weight prn.

## 2014-10-26 ENCOUNTER — Ambulatory Visit (INDEPENDENT_AMBULATORY_CARE_PROVIDER_SITE_OTHER): Payer: Medicare Other | Admitting: Endocrinology

## 2014-10-26 ENCOUNTER — Encounter: Payer: Self-pay | Admitting: Endocrinology

## 2014-10-26 VITALS — BP 116/70 | HR 71 | Temp 97.8°F | Resp 16 | Ht 63.75 in | Wt 155.6 lb

## 2014-10-26 DIAGNOSIS — E1165 Type 2 diabetes mellitus with hyperglycemia: Secondary | ICD-10-CM | POA: Diagnosis not present

## 2014-10-26 DIAGNOSIS — E039 Hypothyroidism, unspecified: Secondary | ICD-10-CM | POA: Diagnosis not present

## 2014-10-26 DIAGNOSIS — IMO0002 Reserved for concepts with insufficient information to code with codable children: Secondary | ICD-10-CM

## 2014-10-26 LAB — TSH: TSH: 3.7 u[IU]/mL (ref 0.35–4.50)

## 2014-10-26 LAB — COMPREHENSIVE METABOLIC PANEL
ALT: 49 U/L — ABNORMAL HIGH (ref 0–35)
AST: 49 U/L — ABNORMAL HIGH (ref 0–37)
Albumin: 4.2 g/dL (ref 3.5–5.2)
Alkaline Phosphatase: 28 U/L — ABNORMAL LOW (ref 39–117)
BUN: 23 mg/dL (ref 6–23)
CALCIUM: 10.1 mg/dL (ref 8.4–10.5)
CHLORIDE: 101 meq/L (ref 96–112)
CO2: 31 mEq/L (ref 19–32)
Creatinine, Ser: 0.78 mg/dL (ref 0.40–1.20)
GFR: 76.84 mL/min (ref >60.00)
GLUCOSE: 81 mg/dL (ref 70–99)
Potassium: 3.9 mEq/L (ref 3.5–5.1)
Sodium: 139 mEq/L (ref 135–145)
TOTAL PROTEIN: 7.6 g/dL (ref 6.0–8.3)
Total Bilirubin: 0.5 mg/dL (ref 0.2–1.2)

## 2014-10-26 LAB — MICROALBUMIN / CREATININE URINE RATIO
Creatinine,U: 187.1 mg/dL
MICROALB UR: 1.8 mg/dL (ref 0.0–1.9)
Microalb Creat Ratio: 1 mg/g (ref 0.0–30.0)

## 2014-10-26 LAB — HEMOGLOBIN A1C: Hgb A1c MFr Bld: 6.3 % (ref 4.6–6.5)

## 2014-10-26 NOTE — Progress Notes (Signed)
Patient ID: Sharon Armstrong, female   DOB: 1940/09/26, 74 y.o.   MRN: [id number]           Reason for Appointment: Consultation for Type 2 Diabetes  Referring physician: Larose Kells  History of Present Illness:          Date of diagnosis of type 2 diabetes mellitus:        Background history: She had been treated for several years with metformin and Amaryl  Her control had been fairly good until about 2012 and she thinks that because of poor control with oral agents she was given Lantus insulin in addition She has been on Lantus and metformin since then with variable control  Recent history:   She is being referred here for persistent A1c readings of over 8% since 05/2013 Her insulin dose has been adjusted based on her blood sugar readings and for the last year has been taking Lantus insulin in the morning She had significantly high blood sugars in early 2016 when she was given steroids for a problem with her vertebral discs; with this he also apparently got acute pancreatitis Subsequently however she was tried on Januvia by a physician in [place] With this her blood sugars started coming down appreciably and because of nocturnal hypoglycemia she stopped taking Januvia  She has been working with the dietitian since 5/16 and has modified her diet Also she has been able to start walking more recently With this her blood sugars have been improving and she has reduced her insulin by 6 units down to 32 units now Currently monitoring blood sugars mostly fasting and she brought a record of her blood sugars that are as indicated below She has also been able to lose weight gradually since last year  INSULIN regimen is described as: 32 units of Lantus in  am     Current blood sugar patterns and problems identified:  Fasting blood sugars are mildly increased and only occasionally below 100, mostly good sugars are in the 120-140 range  Blood sugars recently after meals at night are fairly good although  checking infrequently  Does not check any readings after breakfast and lunch     Oral hypoglycemic drugs the patient is taking EQA:[id number] 1g bid      Side effects from medications have been: None  Compliance with the medical regimen: Good  Hypoglycemia:   none recently  Glucose monitoring:  done 1-2  times a day         Glucometer:    Blood Glucose readings by time of day and averages from meter download:  PREMEAL Breakfast Lunch Dinner Bedtime  Overall   Glucose range: 98-154      Median:        POST-MEAL PC Breakfast PC Lunch PC Dinner  Glucose range:   139-176  Median:      Self-care: The diet that the patient has been following is: tries to limit .     Meal times: Breakfast: Lunch: Dinner:   Typical meal intake: Breakfast is               Dietician visit, most recent:               Exercise: walking 3/7 for 30 min  Weight history: -15  Wt Readings from Last 3 Encounters:  10/26/14 155 lb 9.6 oz (70.58 kg)  10/22/14 155 lb (70.308 kg)  10/20/14 155 lb 6 oz (70.478 kg)    Glycemic control:   Lab Results  Component Value Date   HGBA1C 8.1* 08/03/2014   HGBA1C 8.2* 12/08/2013   HGBA1C 8.3* 06/16/2013   Lab Results  Component Value Date   MICROALBUR 2.8* 12/20/2011   LDLCALC 123 08/03/2014   CREATININE 0.93 08/26/2014         Medication List       This list is accurate as of: 10/26/14 12:18 PM.  Always use your most recent med list.               carbidopa-levodopa 25-250 MG per disintegrating tablet  Commonly known as:  PARCOPA  Take 1 tablet by mouth at bedtime.     estradiol 0.1 MG/GM vaginal cream  Commonly known as:  ESTRACE  Place 1 Applicatorful vaginally as needed.     glucose blood test strip  Check blood sugars no more than TWICE DAILY     insulin glargine 100 UNIT/ML injection  Commonly known as:  LANTUS  Inject 32 Units into the skin daily.     levothyroxine 100 MCG tablet  Commonly known as:  SYNTHROID, LEVOTHROID  Take 1  tablet (100 mcg total) by mouth daily.     lidocaine 5 %  Commonly known as:  LIDODERM  Place 1 patch onto the skin as needed. Remove & Discard patch within 12 hours or as directed by MD     lisinopril 10 MG tablet  Commonly known as:  PRINIVIL,ZESTRIL  TAKE 1 TABLET (10 MG TOTAL) BY MOUTH DAILY.     metFORMIN 500 MG tablet  Commonly known as:  GLUCOPHAGE  Take 1,000 mg by mouth 2 (two) times daily with a meal.     milk thistle 175 MG tablet  Take 175 mg by mouth daily.     omeprazole 40 MG capsule  Commonly known as:  PRILOSEC  Take 1 capsule (40 mg total) by mouth 2 (two) times daily.     ondansetron 4 MG tablet  Commonly known as:  ZOFRAN  Take 1 tablet (4 mg total) by mouth every 8 (eight) hours as needed for nausea or vomiting.     ONETOUCH DELICA LANCETS Misc  Check once daily.     traMADol 50 MG tablet  Commonly known as:  ULTRAM  TAKE 1 TABLET BY MOUTH 4 TIMES A DAY AS NEEDED FOR PAIN     triamterene-hydrochlorothiazide 75-50 MG per tablet  Commonly known as:  MAXZIDE  Take 0.5 tablets by mouth daily.     zolpidem 10 MG tablet  Commonly known as:  AMBIEN  Take 0.5 tablets (5 mg total) by mouth at bedtime as needed for sleep.        Allergies:  Allergies  Allergen Reactions  . Gabapentin Swelling    Edema, legs   . Iodine Hives  . Iohexol   . Nsaids Other (See Comments)    REACTION: hallucinations  . Aciphex [Rabeprazole] Rash  . Ivp Dye [Iodinated Diagnostic Agents] Hives and Rash  . Nexium [Esomeprazole Magnesium] Rash  . Pepcid [Famotidine] Rash  . Protonix [Pantoprazole Sodium] Rash    Past Medical History  Diagnosis Date  . Diabetes mellitus   . Hypertension   . Anxiety   . Aortic valve insufficiency     mild  . Hyperlipemia   . Hypothyroidism   . Elevated liver function tests     fatty liver per CT 2009  . Diverticulosis of colon   . Osteoarthritis   . Cervical stenosis of spine     sees neuro surgery  and dr.ramons s/p local  injection 4/09  . Breast cancer 04/2007    s/p XRT (last 4/09)  . Urticaria 06/2009  . 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 Adenomatous polyps 01-15-99  . Hemorrhoids   . Restless leg syndrome   . Difficulty in urination     pt uses cath at times  . Chronic constipation   . Full body hives     if patient does not take Fexofenadine  . Colon polyps 02/2010    Tubular  Adenomas                                     . Insomnia   . Mitral valve regurgitation   . GERD (gastroesophageal reflux disease)   . Fatty liver   . Cervical myelopathy   . Chronic pancreatitis 09/29/2014    Past Surgical History  Procedure Laterality Date  . Breast lumpectomy Left 2009    left breast  . Appendectomy    . Cataract extraction, bilateral  2/11  . Sinus cyst removed    . Eye surgery    . Cardiac catheterization  2001  . Anterior cervical decomp/discectomy fusion N/A 01/08/2013    Procedure: Cervical three-four, four-five, five-six anterior cervical decompression with fusion plating and bonegraft;  Surgeon: Eustace Moore, MD;  Location: [facility];  Service: Neurosurgery;  Laterality: N/A;  Cervical three-four, four-five, five-six anterior cervical decompression with fusion plating and bonegraft  . Ganglion cyst excision Right     wrist  . Uterine fibroid surgery      Family History  Problem Relation Age of Onset  . Coronary artery disease Neg Hx   . Hypertension Mother   . Diabetes Neg Hx   . Stroke Neg Hx   . Colon cancer Neg Hx   . Breast cancer Neg Hx   . Lung cancer Mother     mets to brain  . Tuberculosis Father     died at 33  . Brain cancer Mother     Social History:  reports that she has never smoked. She has never used smokeless tobacco. She reports that she drinks alcohol. She reports that she does not use illicit drugs.    Review of Systems    Lipid history: Her levels are showing increased LDL, patient has not been on any lipid-lowering drugs possibly because  of liver dysfunction    Lab Results  Component Value Date   CHOL 200 08/03/2014   CHOL 200 08/03/2014   HDL 47 08/03/2014   LDLCALC 123 08/03/2014   TRIG 149 08/03/2014   TRIG 149 08/03/2014   CHOLHDL 3 01/24/2011           Constitutional: no recent weight gain/loss, no complaints of unusual fatigue   Eyes: no history of blurred vision.  Most recent eye exam was in 2/16 and has no known retinopathy  ENT: no nasal congestion, difficulty swallowing  Cardiovascular: no chest pain or tightness on exertion.  No leg swelling.  Hypertension: She is on lisinopril and Maxzide  Respiratory: no cough/shortness of breath  Gastrointestinal: no constipation, diarrhea.  Tends to have periodic nausea possibly from reflux and takes Zofran as needed  No recent abdominal pain.  Had acute pancreatitis in mid March in [place] liver present for 20 years, previously diagnosed at [facility]. Has been recommended milk thistle for this  and she thinks her liver function that improved with this previously  Musculoskeletal: no muscle/joint aches   Urological:   No frequency of urination or  nocturia  Skin: no rash or infections  Neurological: no headaches.  Has slight numbness in the bottom of her feet but no burning, pains or tingling in feet.  Occasionally will have jabbing pain  Psychiatric: no symptoms of depression, does have occasional insomnia  Endocrine: No unusual fatigue, cold intolerance  Long history of thyroid disease: Was told in her teen years that she was hypothyroid and this may have been a sequela to possible radiation treatment in childhood in [place]     Lab Results  Component Value Date   TSH 3.09 04/07/2014      Physical Examination:  BP 116/70 mmHg  Pulse 71  Temp(Src) 97.8 F (36.6 C)  Resp 16  Ht 5' 3.75" (1.619 m)  Wt 155 lb 9.6 oz (70.58 kg)  BMI 26.93 kg/m2  SpO2 96%  GENERAL:  she is averagely built and nourished, no significant obesity HEENT:          Eye exam shows normal external appearance. Fundus exam shows no retinopathy although difficult to visualize on the right.  Oral exam shows normal mucosa .  NECK:   There is no lymphadenopathy Thyroid is not enlarged and no nodules felt.  Carotids are normal to palpation and no bruit heard LUNGS:         Chest is symmetrical. Lungs are clear to auscultation.Marland Kitchen   HEART:         Heart sounds:  S1 and S2 are normal. No S3 or S4.  3/6 ejection murmur at the base heard in sitting position  ABDOMEN:   There is no distention present. Liver and spleen are not palpable. No other mass or tenderness present.   NEUROLOGICAL:   Vibration sense is absent bilaterally in distal first toes. Ankle jerks are 2+ on the left and 1+ on the right.  biceps reflexes are normal         Decreased monofilament sensation on the toes and distal plantar surfaces of the right side MUSCULOSKELETAL:  There is no swelling or deformity of the peripheral joints. Spine is normal to inspection.   EXTREMITIES:     There is no edema. No skin lesions present.Marland Kitchen SKIN:       No rash or lesions of concern.        ASSESSMENT:  Diabetes type 2, uncontrolled    She has only mild obesity and has been requiring basal insulin since about 2012 However she has never been tried on oral hypoglycemic drugs other than metformin and Amaryl and has never tried GLP-1 drugs either More recently with her modifying her diet and starting a walking program she appears to have excellent control at home Her blood sugars after meals are also fairly good despite not taking any mealtime insulin However need to verify that her A1c is adequately controlled and also that blood sugars are not high after her breakfast and lunch meals when she is not monitoring  Complications: She has some signs of neuropathy with absent vibration sense and decreased monofilament sensation on the right.  May have some residual sensory changes from her cervical disc disease  also. Need to have her urine microalbumin checked  HYPERTENSION: Mild and well controlled  Hyperlipidemia: Treatment has apparently been deferred because of abnormal liver functions but she may benefit from a drug like Zetia or WelChol, may also have  additional benefits of using WelChol with her diabetes  Hypothyroidism: Reportedly has long-standing hypothyroidism currently taking 100 g levothyroxine  Fatty liver/cirrhosis: Not on any specific treatment.  May be a candidate for an insulin sensitizer like Actos which will reduce visceral fat.  Will defer to gastroenterologist  History of acute pancreatitis of unclear etiology.  Also has some changes suggestive of chronic pancreatitis but is asymptomatic  PLAN:   Continue same regimen of insulin and metformin for now  Will check A1c today  She will try to check blood sugars more often after meals especially breakfast and lunch to help assess postprandial control  Consider Januvia if blood sugars are not well controlled or A1c is over 7% as this appeared to have benefited her previously and may be able to reduce her insulin requirement significantly.  She had taken this without any side effects and her episode of pancreatitis preceded her treatment.  Reassured her that there is no scientific data indicating this causes pancreatitis  Continue increasing walking  Continue working with dietitian for meal planning; she can continue using boost for diabetics but use half a bottle twice a day for better tolerability Check thyroid levels today Check urine microalbumin today  Patient Instructions  Check blood sugars on waking up ..3  .. times a week Also check blood sugars about 2 hours after a meal and do this after different meals by rotation  Recommended blood sugar levels on waking up is 90-130 and about 2 hours after meal is 140-180 Please bring blood sugar monitor to each visit.     Sharon Armstrong 10/26/2014, 12:18 PM   Note: This  office note was prepared with Dragon voice recognition system technology. Any transcriptional errors that result from this process are unintentional. `

## 2014-10-26 NOTE — Patient Instructions (Signed)
Check blood sugars on waking up ..3  .. times a week Also check blood sugars about 2 hours after a meal and do this after different meals by rotation  Recommended blood sugar levels on waking up is 90-130 and about 2 hours after meal is 140-180 Please bring blood sugar monitor to each visit.  

## 2014-10-29 ENCOUNTER — Telehealth: Payer: Self-pay | Admitting: Gastroenterology

## 2014-10-29 MED ORDER — CIPROFLOXACIN HCL 500 MG PO TABS: 500.0000 mg | ORAL_TABLET | Freq: Two times a day (BID) | ORAL | Status: DC

## 2014-10-29 NOTE — Telephone Encounter (Signed)
Pt has been having LLQ abdominal pain for several days and it is getting worse. States it is tender to touch and it hurts when she walks. Pt states she has had no fever. Pt wonders if she is having a diverticulitis flare. Please advise.

## 2014-10-29 NOTE — Telephone Encounter (Signed)
cipro 500 mg BID x 7 days per Dr. Fuller Plan.  Patient notified that she should go on a clear liquid diet for the next 24-36 hours then can slowly advance her diet to full liquids then progress to a soft diet.  She is asked to call back with any additional questions or concerns if her symptoms have not improved by next week Tues or Wed

## 2014-11-01 ENCOUNTER — Ambulatory Visit: Payer: Medicare Other | Admitting: Internal Medicine

## 2014-11-19 ENCOUNTER — Telehealth: Payer: Self-pay | Admitting: Internal Medicine

## 2014-11-19 NOTE — Telephone Encounter (Signed)
Received fax confirmation on 11/19/2014 at 12:29PM.

## 2014-11-19 NOTE — Telephone Encounter (Signed)
[employer] insurance verification form completed and faxed to [employer] at [phone]. Form sent for scanning into Pt's chart. Awaiting fax confirmation and insurance verification.

## 2014-11-19 NOTE — Telephone Encounter (Signed)
Can she be scheduled since you have confirmation? I'm not sure the process for Prolia.

## 2014-11-19 NOTE — Telephone Encounter (Signed)
Pt is not due for another OV with Dr. Larose Kells until August. However, do not schedule appt for Prolia yet. I have to send insurance verification which can take several weeks to hear back from and order the Prolia. Just inform Pt that once we have heard back from her insurance and have received the Prolia we will call to schedule her appt. She can schedule appt for August for her routine F/U if she would like to.

## 2014-11-19 NOTE — Telephone Encounter (Signed)
Pt called in for prolia shot. Scheduled with Dr. Larose Kells 7/13 2:00pm. She said she doesn't think she needs to see Dr. Larose Kells. Does he want to see her or should I change to nurse visit?

## 2014-11-19 NOTE — Telephone Encounter (Signed)
Pt was not happy with me calling to tell her we need approval for Prolia. She said she gets it here once a year and in [place] once a year. She states that we keep getting it moved out further and further which pushes out the one she gets in [place]. She said that it doesn't even require approval and we should note this in her chart. I cancelled the July appt due to not having approval. Pt was upset and said now once it is approved (or we are told again that it doesn't require approval) that her appt will get moved out again.

## 2014-11-19 NOTE — Telephone Encounter (Signed)
Per our Prolia process, we have to get insurance verification through [employer] (the makers of the medication), not her insurance. Please apologize for to the Pt but there is nothing I can do to avoid this process.

## 2014-11-19 NOTE — Telephone Encounter (Signed)
No that was fax confirmation. We will call her when we receive the insurance confirmation and [employer] to set up appt.

## 2014-11-23 NOTE — Telephone Encounter (Signed)
Received insurance verification from [employer] for Pt, record ID # [id number]. Pt has an estimated $0 out-of-pocket cost for Prolia injection. Will send insurance verification for scanning into Pt's chart.

## 2014-11-23 NOTE — Telephone Encounter (Signed)
Message sent to [facility] for Prolia order.

## 2014-11-25 NOTE — Telephone Encounter (Signed)
Nurse visit scheduled for 11/30/14

## 2014-11-25 NOTE — Telephone Encounter (Signed)
Please inform Pt that her Prolia has came in. She may schedule a nurse visit at her convenience so that we may administer it. Thank you.

## 2014-11-26 ENCOUNTER — Other Ambulatory Visit: Payer: Self-pay

## 2014-11-30 ENCOUNTER — Telehealth: Payer: Self-pay | Admitting: *Deleted

## 2014-11-30 ENCOUNTER — Ambulatory Visit (INDEPENDENT_AMBULATORY_CARE_PROVIDER_SITE_OTHER): Payer: Medicare Other | Admitting: *Deleted

## 2014-11-30 ENCOUNTER — Telehealth: Payer: Self-pay

## 2014-11-30 DIAGNOSIS — E538 Deficiency of other specified B group vitamins: Secondary | ICD-10-CM

## 2014-11-30 DIAGNOSIS — M810 Age-related osteoporosis without current pathological fracture: Secondary | ICD-10-CM | POA: Diagnosis not present

## 2014-11-30 DIAGNOSIS — R5383 Other fatigue: Secondary | ICD-10-CM

## 2014-11-30 DIAGNOSIS — R5382 Chronic fatigue, unspecified: Secondary | ICD-10-CM

## 2014-11-30 MED ORDER — DENOSUMAB 60 MG/ML ~~LOC~~ SOLN
60.0000 mg | Freq: Once | SUBCUTANEOUS | Status: AC
  Administered 2014-11-30: 60 mg via SUBCUTANEOUS

## 2014-11-30 MED ORDER — CYANOCOBALAMIN 1000 MCG/ML IJ SOLN: 1000.0000 ug | Freq: Once | INTRAMUSCULAR | Status: DC

## 2014-11-30 NOTE — Progress Notes (Signed)
Patient requesting B12 level to be drawn due to feeling fatigued.  Note routed to Dr. Larose Kells.

## 2014-11-30 NOTE — Progress Notes (Signed)
Pre visit review using our clinic review tool, if applicable. No additional management support is needed unless otherwise documented below in the visit note.  Patient tolerated injection well; no signs or symptoms of a reaction before leaving the office. 

## 2014-11-30 NOTE — Telephone Encounter (Signed)
Patient states she has been feeling very fatigued for a long time and is requesting her B12 to be checked.  May she have this lab?

## 2014-11-30 NOTE — Progress Notes (Deleted)
Patient requested B12 injection today.   Per lab note:  Notes Recorded by Rosalita Chessman, DO on 11/19/2014 at 5:18 PM im sure the glucose was an error--- repeat bmp at pt convenience b12 low--- May benefit from b12 Injections weekly x4 then monthly Recheck 1 month

## 2014-11-30 NOTE — Telephone Encounter (Signed)
Pt came into office today to have Prolia. Received Handicap placard renewal form from [place]. Informed her that I would complete and give to Dr. Larose Kells for signing and will mail copy back to Pt. Form completed to the best of my ability and placed in Dr. Ethel Rana red folder for completion.

## 2014-11-30 NOTE — Telephone Encounter (Signed)
Okay to check a R15 and folic acid level, DX fatigue

## 2014-12-01 ENCOUNTER — Ambulatory Visit: Payer: Medicare Other | Admitting: Internal Medicine

## 2014-12-01 NOTE — Telephone Encounter (Signed)
Future labs ordered, patient notified, and lab visit scheduled 12/02/14.

## 2014-12-01 NOTE — Telephone Encounter (Signed)
Received completed form from Dr. Larose Kells, called Pt and spoke with Francee Piccolo, Pt's husband, informed him that Handicap placard form is completed. He requested I place form in the mail to them. I informed them I would. Copy made for scanning into chart and form placed in mail back to Pt.

## 2014-12-01 NOTE — Addendum Note (Signed)
Addended by: Leticia Penna A on: 12/01/2014 03:09 PM   Modules accepted: Orders

## 2014-12-02 ENCOUNTER — Other Ambulatory Visit: Payer: Medicare Other

## 2014-12-02 DIAGNOSIS — R5383 Other fatigue: Secondary | ICD-10-CM

## 2014-12-02 LAB — VITAMIN B12: Vitamin B-12: 231 pg/mL (ref 211–911)

## 2014-12-02 LAB — FOLATE: Folate: 12 ng/mL (ref >5.9)

## 2014-12-10 ENCOUNTER — Ambulatory Visit (INDEPENDENT_AMBULATORY_CARE_PROVIDER_SITE_OTHER): Payer: Medicare Other | Admitting: Internal Medicine

## 2014-12-10 ENCOUNTER — Encounter: Payer: Self-pay | Admitting: Internal Medicine

## 2014-12-10 VITALS — BP 122/74 | HR 78 | Temp 98.1°F | Ht 63.75 in | Wt 153.5 lb

## 2014-12-10 DIAGNOSIS — R5382 Chronic fatigue, unspecified: Secondary | ICD-10-CM

## 2014-12-10 DIAGNOSIS — M810 Age-related osteoporosis without current pathological fracture: Secondary | ICD-10-CM

## 2014-12-10 LAB — VITAMIN D 25 HYDROXY (VIT D DEFICIENCY, FRACTURES): VITD: 45.55 ng/mL (ref 30.00–100.00)

## 2014-12-10 LAB — SEDIMENTATION RATE: Sed Rate: 58 mm/hr — ABNORMAL HIGH (ref 0–22)

## 2014-12-10 MED ORDER — HYDROCODONE-ACETAMINOPHEN 5-325 MG PO TABS: 1.0000 | ORAL_TABLET | Freq: Three times a day (TID) | ORAL | Status: DC | PRN

## 2014-12-10 MED ORDER — ESCITALOPRAM OXALATE 10 MG PO TABS: 10.0000 mg | ORAL_TABLET | Freq: Every day | ORAL | Status: DC

## 2014-12-10 NOTE — Assessment & Plan Note (Signed)
Chronic fatigue, getting worse, recent E70, folic acid, BMP and CBC normal. She is a sleepy throughout the day and unable to sleep during the night. Very anxious mostly related to her multiple medical problems. Plan: Check a vitamin D and sedimentation rate (although she does not have major headaches or aches) Treat anxiety with Lexapro She has been taking tramadol l 2 tablets BID for long time, Tramadol interacts with Lexapro. Will taper off tramadol for one week and then switch to hydrocodone. Start Lexapro in one week. Advise patient to try to reverse her sleep pattern (avoid naps during the day and sleep at night) Reassess in 4 weeks If she's not better, will consider referral to sleep specialist. She is a sleepy throughout the day, sleep apnea? ( Noting that she does not snore)

## 2014-12-10 NOTE — Progress Notes (Signed)
Subjective:    Patient ID: Sharon Armstrong, female    DOB: 09/12/1940, 74 y.o.   MRN: [id number]  DOS:  12/10/2014 Type of visit - description : To discuss fatigue Interval history: Several years history of fatigue, increasing for the last few months. States that she simply has no energy, she could fall asleep anytime during the day and she does frequently. Unable to sleep at night. When asked, she admits to a lot of anxiety mostly due to her health issues including chronic pancreatitis. She has a history of RLS, symptoms is slightly worse, issue follow-up by a  practitioner in [place]. Currently on carbidopa levodopa.   Review of Systems  Denies chest pain, difficulty breathing, no lower extremity edema or DOE. No unusual aches, pains, headaches or myalgias. No depression per se. History of weight loss.  Past Medical History  Diagnosis Date  . Diabetes mellitus   . Hypertension   . Anxiety   . Aortic valve insufficiency     mild  . Hyperlipemia   . Hypothyroidism   . Elevated liver function tests     fatty liver per CT 2009  . Diverticulosis of colon   . Osteoarthritis   . Cervical stenosis of spine     sees neuro surgery and dr.ramons s/p local injection 4/09  . Breast cancer 04/2007    s/p XRT (last 4/09)  . Urticaria 06/2009  . 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 Adenomatous polyps 01-12-1999  . Hemorrhoids   . Restless leg syndrome   . Difficulty in urination     pt uses cath at times  . Chronic constipation   . Full body hives     if patient does not take Fexofenadine  . Colon polyps 02/2010    Tubular  Adenomas                                     . Insomnia   . Mitral valve regurgitation   . GERD (gastroesophageal reflux disease)   . Fatty liver   . Cervical myelopathy   . Chronic pancreatitis 09/29/2014    Past Surgical History  Procedure Laterality Date  . Breast lumpectomy Left 2009    left breast  . Appendectomy    . Cataract  extraction, bilateral  2/11  . Sinus cyst removed    . Eye surgery    . Cardiac catheterization  2001  . Anterior cervical decomp/discectomy fusion N/A 01/08/2013    Procedure: Cervical three-four, four-five, five-six anterior cervical decompression with fusion plating and bonegraft;  Surgeon: Eustace Moore, MD;  Location: [place] [facility];  Service: Neurosurgery;  Laterality: N/A;  Cervical three-four, four-five, five-six anterior cervical decompression with fusion plating and bonegraft  . Ganglion cyst excision Right     wrist  . Uterine fibroid surgery      History   Social History  . Marital Status: Married    Spouse Name: N/A  . Number of Children: 2  . Years of Education: N/A   Occupational History  . custom decorator    Social History Main Topics  . Smoking status: Never Smoker   . Smokeless tobacco: Never Used  . Alcohol Use: 0.0 oz/week    0 Standard drinks or equivalent per week     Comment: rare  . Drug Use: No  . Sexual Activity: Not on file  Other Topics Concern  . Not on file   Social History Narrative   Lives in [place] half of the time            Medication List       This list is accurate as of: 12/10/14  8:43 PM.  Always use your most recent med list.               carbidopa-levodopa 25-250 MG per disintegrating tablet  Commonly known as:  PARCOPA  Take 1 tablet by mouth at bedtime.     escitalopram 10 MG tablet  Commonly known as:  LEXAPRO  Take 1 tablet (10 mg total) by mouth daily.     estradiol 0.1 MG/GM vaginal cream  Commonly known as:  ESTRACE  Place 1 Applicatorful vaginally as needed.     glucose blood test strip  Check blood sugars no more than TWICE DAILY     HYDROcodone-acetaminophen 5-325 MG per tablet  Commonly known as:  NORCO/VICODIN  Take 1 tablet by mouth 3 (three) times daily as needed.     insulin glargine 100 UNIT/ML injection  Commonly known as:  LANTUS  Inject 32 Units into the skin daily.     levothyroxine  100 MCG tablet  Commonly known as:  SYNTHROID, LEVOTHROID  Take 1 tablet (100 mcg total) by mouth daily.     lidocaine 5 %  Commonly known as:  LIDODERM  Place 1 patch onto the skin as needed. Remove & Discard patch within 12 hours or as directed by MD     lisinopril 10 MG tablet  Commonly known as:  PRINIVIL,ZESTRIL  TAKE 1 TABLET (10 MG TOTAL) BY MOUTH DAILY.     metFORMIN 500 MG tablet  Commonly known as:  GLUCOPHAGE  Take 1,000 mg by mouth 2 (two) times daily with a meal.     milk thistle 175 MG tablet  Take 175 mg by mouth daily.     omeprazole 40 MG capsule  Commonly known as:  PRILOSEC  Take 1 capsule (40 mg total) by mouth 2 (two) times daily.     ondansetron 4 MG tablet  Commonly known as:  ZOFRAN  Take 1 tablet (4 mg total) by mouth every 8 (eight) hours as needed for nausea or vomiting.     ONETOUCH DELICA LANCETS Misc  Check once daily.     triamterene-hydrochlorothiazide 75-50 MG per tablet  Commonly known as:  MAXZIDE  Take 0.5 tablets by mouth daily.     zolpidem 10 MG tablet  Commonly known as:  AMBIEN  Take 0.5 tablets (5 mg total) by mouth at bedtime as needed for sleep.           Objective:   Physical Exam BP 122/74 mmHg  Pulse 78  Temp(Src) 98.1 F (36.7 C) (Oral)  Ht 5' 3.75" (1.619 m)  Wt 153 lb 8 oz (69.627 kg)  BMI 26.56 kg/m2  SpO2 97% General:   Well developed, well nourished . NAD.  HEENT:  Normocephalic . Face symmetric, atraumatic. No thyromegaly Lungs:  CTA B Normal respiratory effort, no intercostal retractions, no accessory muscle use. Heart: RRR,  no murmur.  No pretibial edema bilaterally  Skin: Not pale. Not jaundice Neurologic:  alert & oriented X3.  Speech normal, gait appropriate for age and unassisted Psych--  Cognition and judgment appear intact.  Cooperative with normal attention span and concentration.  Behavior appropriate. Anxious appearing, tearful at times.     Assessment & Plan:  Today , I spent  more than  45  min with the patient: >50% of the time counseling regards different SSRI options, working to avoid med interaction (changing pain meds), answering multiple questions, reviewing the chart to avoid duplication of previous labs

## 2014-12-10 NOTE — Progress Notes (Signed)
Pre visit review using our clinic review tool, if applicable. No additional management support is needed unless otherwise documented below in the visit note. 

## 2014-12-10 NOTE — Patient Instructions (Addendum)
Get your blood work before you leave    For one week: Take only one tramadol twice a day Okay to take the hydrocodone as needed  After one week: Stop tramadol Start Lexapro

## 2014-12-11 ENCOUNTER — Other Ambulatory Visit: Payer: Self-pay | Admitting: Internal Medicine

## 2014-12-24 ENCOUNTER — Telehealth: Payer: Self-pay | Admitting: Internal Medicine

## 2014-12-24 NOTE — Telephone Encounter (Signed)
Please call the patient, we started Lexapro and stopped tramadol, how is she doing?

## 2014-12-27 ENCOUNTER — Other Ambulatory Visit: Payer: Self-pay | Admitting: Internal Medicine

## 2014-12-27 NOTE — Telephone Encounter (Signed)
Pt is requesting refill on Ambien.  Last OV: 12/10/2014  Last Fill: 08/31/2014 #[id number] 1RF  Please advise.

## 2014-12-27 NOTE — Telephone Encounter (Signed)
Rx printed, awaiting MD signature.  

## 2014-12-27 NOTE — Telephone Encounter (Signed)
#  30 and 2 refills 

## 2014-12-27 NOTE — Telephone Encounter (Signed)
Rx faxed to [facility].  

## 2014-12-27 NOTE — Telephone Encounter (Signed)
LMOM informing Pt to return call.  

## 2014-12-31 ENCOUNTER — Ambulatory Visit: Payer: Medicare Other | Admitting: Internal Medicine

## 2014-12-31 NOTE — Telephone Encounter (Signed)
Pt has follow-up appt scheduled for 01/05/2015 at [facility].

## 2015-01-05 ENCOUNTER — Encounter: Payer: Self-pay | Admitting: Internal Medicine

## 2015-01-05 ENCOUNTER — Ambulatory Visit (INDEPENDENT_AMBULATORY_CARE_PROVIDER_SITE_OTHER): Payer: Medicare Other | Admitting: Internal Medicine

## 2015-01-05 ENCOUNTER — Telehealth: Payer: Self-pay | Admitting: Internal Medicine

## 2015-01-05 VITALS — BP 126/78 | HR 75 | Temp 98.2°F | Ht 64.0 in | Wt 155.4 lb

## 2015-01-05 DIAGNOSIS — G2581 Restless legs syndrome: Secondary | ICD-10-CM | POA: Diagnosis not present

## 2015-01-05 DIAGNOSIS — R5382 Chronic fatigue, unspecified: Secondary | ICD-10-CM

## 2015-01-05 DIAGNOSIS — F411 Generalized anxiety disorder: Secondary | ICD-10-CM

## 2015-01-05 DIAGNOSIS — R634 Abnormal weight loss: Secondary | ICD-10-CM | POA: Diagnosis not present

## 2015-01-05 MED ORDER — ZOLPIDEM TARTRATE 10 MG PO TABS
5.0000 mg | ORAL_TABLET | Freq: Every evening | ORAL | Status: DC | PRN
Start: 2015-01-05 — End: 2015-03-08

## 2015-01-05 MED ORDER — CLONAZEPAM 0.5 MG PO TABS: 0.5000 mg | ORAL_TABLET | Freq: Two times a day (BID) | ORAL | Status: DC | PRN

## 2015-01-05 MED ORDER — CYANOCOBALAMIN 1000 MCG/ML IJ SOLN
1000.0000 ug | Freq: Once | INTRAMUSCULAR | Status: AC
  Administered 2015-01-05: 1000 ug via INTRAMUSCULAR

## 2015-01-05 NOTE — Assessment & Plan Note (Addendum)
Having a difficult time sleeping due to restless leg symptoms, on Sinemet. We are prescribing clonazepam for anxiety, it actually may help with RLS.

## 2015-01-05 NOTE — Assessment & Plan Note (Addendum)
Since last year, she has lost several pounds, patient concern. Recommend an abundant but healthy diet avoiding excessive carbohydrates due to history of diabetes, also recommend to increase her physical activity, I suspect   weight loss is in part  due to loss of muscle mass. Protein supplements for diabetics are also welcome.

## 2015-01-05 NOTE — Assessment & Plan Note (Addendum)
Since the last visit, the sedimentation rate came back 53, slightly elevated but not necessarily abnormal for a 74 year old lady. Fatigue was felt to be multifactorial including related to her poor sleep pattern, anxiety, etc. I suggested a trial with Lexapro, stop tramadol and start hydrocodone but she did not do those changes and likes to stay on tramadol. Request B12 shots. Plan: Trial with B12 shots although her levels has been in the  lower side of normal Treat anxiety with clonazepam which may also help with her abnormal sleep pattern  reassess in 2 months, sleep medicine referral?

## 2015-01-05 NOTE — Progress Notes (Signed)
Pre visit review using our clinic review tool, if applicable. No additional management support is needed unless otherwise documented below in the visit note. 

## 2015-01-05 NOTE — Assessment & Plan Note (Signed)
Continue with anxiety, see last office visit note. Symptoms related to her multiple medical problems. Unable to take Lexapro due to taking tramadol. Plan: Trial with clonazepam twice a day. Clonazepam may actually help her with her sleep pattern and RLS.

## 2015-01-05 NOTE — Telephone Encounter (Signed)
Relation to YO:YOOJ Call back number: [phone] Pharmacy: [id number] [place], [place] [phone] (Phone) [phone] (Fax)         Reason for call:  atient wanted to advise MD the name of the medication to cause foot swelling was gabapentin

## 2015-01-05 NOTE — Telephone Encounter (Signed)
FYI

## 2015-01-05 NOTE — Progress Notes (Signed)
Subjective:    Patient ID: Sharon Armstrong, female    DOB: 11/23/1940, 74 y.o.   MRN: [id number]  DOS:  01/05/2015 Type of visit - description : Follow-up Interval history: At the last office visit, several medication changes were discussed, she was supposed to start Vicodin but she didn't because she thought it was a NSAID consequently she simply continue with Ultram. For that reason, she did not start Lexapro. Continue with fatigue, request a B12 shot Continue with a disrupted sleep pattern, unable to sleep at night due to RLS Continue with anxiety again mostly related to her multiple medical conditions. Has noted weight loss and skin of her legs is sagging and is concerned about it. Has multiple questions about  her sedimentation rate  Review of Systems   Past Medical History  Diagnosis Date  . Diabetes mellitus   . Hypertension   . Anxiety   . Aortic valve insufficiency     mild  . Hyperlipemia   . Hypothyroidism   . Elevated liver function tests     fatty liver per CT 2009  . Diverticulosis of colon   . Osteoarthritis   . Cervical stenosis of spine     sees neuro surgery and dr.ramons s/p local injection 4/09  . Breast cancer 04/2007    s/p XRT (last 4/09)  . Urticaria 06/2009  . 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 Adenomatous polyps 1999/01/12  . Hemorrhoids   . Restless leg syndrome   . Difficulty in urination     pt uses cath at times  . Chronic constipation   . Full body hives     if patient does not take Fexofenadine  . Colon polyps 02/2010    Tubular  Adenomas                                     . Insomnia   . Mitral valve regurgitation   . GERD (gastroesophageal reflux disease)   . Fatty liver   . Cervical myelopathy   . Chronic pancreatitis 09/29/2014    Past Surgical History  Procedure Laterality Date  . Breast lumpectomy Left 2009    left breast  . Appendectomy    . Cataract extraction, bilateral  2/11  . Sinus cyst removed    .  Eye surgery    . Cardiac catheterization  2001  . Anterior cervical decomp/discectomy fusion N/A 01/08/2013    Procedure: Cervical three-four, four-five, five-six anterior cervical decompression with fusion plating and bonegraft;  Surgeon: Eustace Moore, MD;  Location: [place] [facility];  Service: Neurosurgery;  Laterality: N/A;  Cervical three-four, four-five, five-six anterior cervical decompression with fusion plating and bonegraft  . Ganglion cyst excision Right     wrist  . Uterine fibroid surgery      Social History   Social History  . Marital Status: Married    Spouse Name: N/A  . Number of Children: 2  . Years of Education: N/A   Occupational History  . custom decorator    Social History Main Topics  . Smoking status: Never Smoker   . Smokeless tobacco: Never Used  . Alcohol Use: 0.0 oz/week    0 Standard drinks or equivalent per week     Comment: rare  . Drug Use: No  . Sexual Activity: Not on file   Other Topics Concern  .  Not on file   Social History Narrative   Lives in [place] half of the time            Medication List       This list is accurate as of: 01/05/15 11:59 PM.  Always use your most recent med list.               carbidopa-levodopa 25-250 MG per disintegrating tablet  Commonly known as:  PARCOPA  Take 1 tablet by mouth at bedtime.     clonazePAM 0.5 MG tablet  Commonly known as:  KLONOPIN  Take 1 tablet (0.5 mg total) by mouth 2 (two) times daily as needed for anxiety.     estradiol 0.1 MG/GM vaginal cream  Commonly known as:  ESTRACE  Place 1 Applicatorful vaginally as needed.     glucose blood test strip  Check blood sugars no more than TWICE DAILY     insulin glargine 100 UNIT/ML injection  Commonly known as:  LANTUS  Inject 32 Units into the skin daily.     levothyroxine 100 MCG tablet  Commonly known as:  SYNTHROID, LEVOTHROID  Take 1 tablet (100 mcg total) by mouth daily.     lidocaine 5 %  Commonly known as:  LIDODERM    Place 1 patch onto the skin as needed. Remove & Discard patch within 12 hours or as directed by MD     lisinopril 10 MG tablet  Commonly known as:  PRINIVIL,ZESTRIL  Take 1 tablet (10 mg total) by mouth daily.     metFORMIN 500 MG tablet  Commonly known as:  GLUCOPHAGE  Take 1,000 mg by mouth 2 (two) times daily with a meal.     milk thistle 175 MG tablet  Take 175 mg by mouth daily.     omeprazole 40 MG capsule  Commonly known as:  PRILOSEC  Take 1 capsule (40 mg total) by mouth 2 (two) times daily.     ondansetron 4 MG tablet  Commonly known as:  ZOFRAN  Take 1 tablet (4 mg total) by mouth every 8 (eight) hours as needed for nausea or vomiting.     ONETOUCH DELICA LANCETS Misc  Check once daily.     traMADol 50 MG tablet  Commonly known as:  ULTRAM  Take 2 tablets by mouth 2 (two) times daily.     triamterene-hydrochlorothiazide 75-50 MG per tablet  Commonly known as:  MAXZIDE  Take 0.5 tablets by mouth daily.     zolpidem 10 MG tablet  Commonly known as:  AMBIEN  Take 0.5 tablets (5 mg total) by mouth at bedtime as needed for sleep.           Objective:   Physical Exam BP 126/78 mmHg  Pulse 75  Temp(Src) 98.2 F (36.8 C) (Oral)  Ht 5\' 4"  (1.626 m)  Wt 155 lb 6 oz (70.478 kg)  BMI 26.66 kg/m2  SpO2 98% General:   Well developed, well nourished . NAD.  HEENT:  Normocephalic . Face symmetric, atraumatic Neurologic:  alert & oriented X3.  Speech normal, gait appropriate for age and unassisted Psych--  Cognition and judgment appear intact.  Cooperative with normal attention span and concentration.  Behavior appropriate. No anxious or depressed appearing.      Assessment & Plan:  Today, I spent more than  30  min with the patient: >50% of the time counseling regards sed rate results, weight loss, how to take clonazepam, multiple questions answered to the  best of my ability

## 2015-01-06 ENCOUNTER — Other Ambulatory Visit: Payer: Self-pay | Admitting: Gastroenterology

## 2015-01-06 NOTE — Telephone Encounter (Signed)
Noted, thx.

## 2015-01-25 ENCOUNTER — Other Ambulatory Visit: Payer: Medicare Other

## 2015-01-27 ENCOUNTER — Ambulatory Visit: Payer: Medicare Other | Admitting: Endocrinology

## 2015-01-29 ENCOUNTER — Encounter (HOSPITAL_BASED_OUTPATIENT_CLINIC_OR_DEPARTMENT_OTHER): Payer: Self-pay | Admitting: Emergency Medicine

## 2015-01-29 ENCOUNTER — Emergency Department (HOSPITAL_BASED_OUTPATIENT_CLINIC_OR_DEPARTMENT_OTHER)
Admission: EM | Admit: 2015-01-29 | Discharge: 2015-01-29 | Disposition: A | Discharge status: Home / Self Care | Payer: Medicare Other | Attending: Emergency Medicine | Admitting: Emergency Medicine

## 2015-01-29 DIAGNOSIS — F419 Anxiety disorder, unspecified: Secondary | ICD-10-CM | POA: Insufficient documentation

## 2015-01-29 DIAGNOSIS — E119 Type 2 diabetes mellitus without complications: Secondary | ICD-10-CM | POA: Diagnosis not present

## 2015-01-29 DIAGNOSIS — R319 Hematuria, unspecified: Secondary | ICD-10-CM | POA: Diagnosis not present

## 2015-01-29 DIAGNOSIS — G4700 Insomnia, unspecified: Secondary | ICD-10-CM | POA: Diagnosis not present

## 2015-01-29 DIAGNOSIS — Z853 Personal history of malignant neoplasm of breast: Secondary | ICD-10-CM | POA: Diagnosis not present

## 2015-01-29 DIAGNOSIS — E039 Hypothyroidism, unspecified: Secondary | ICD-10-CM | POA: Diagnosis not present

## 2015-01-29 DIAGNOSIS — R300 Dysuria: Secondary | ICD-10-CM | POA: Diagnosis present

## 2015-01-29 DIAGNOSIS — Z86010 Personal history of colonic polyps: Secondary | ICD-10-CM | POA: Insufficient documentation

## 2015-01-29 DIAGNOSIS — M1990 Unspecified osteoarthritis, unspecified site: Secondary | ICD-10-CM | POA: Diagnosis not present

## 2015-01-29 DIAGNOSIS — I10 Essential (primary) hypertension: Secondary | ICD-10-CM | POA: Diagnosis not present

## 2015-01-29 DIAGNOSIS — Z79899 Other long term (current) drug therapy: Secondary | ICD-10-CM | POA: Diagnosis not present

## 2015-01-29 DIAGNOSIS — Z872 Personal history of diseases of the skin and subcutaneous tissue: Secondary | ICD-10-CM | POA: Insufficient documentation

## 2015-01-29 DIAGNOSIS — N390 Urinary tract infection, site not specified: Secondary | ICD-10-CM | POA: Insufficient documentation

## 2015-01-29 DIAGNOSIS — Z794 Long term (current) use of insulin: Secondary | ICD-10-CM | POA: Diagnosis not present

## 2015-01-29 DIAGNOSIS — G2581 Restless legs syndrome: Secondary | ICD-10-CM | POA: Insufficient documentation

## 2015-01-29 DIAGNOSIS — Z9889 Other specified postprocedural states: Secondary | ICD-10-CM | POA: Diagnosis not present

## 2015-01-29 DIAGNOSIS — K219 Gastro-esophageal reflux disease without esophagitis: Secondary | ICD-10-CM | POA: Insufficient documentation

## 2015-01-29 LAB — URINALYSIS, ROUTINE W REFLEX MICROSCOPIC
Bilirubin Urine: NEGATIVE
GLUCOSE, UA: NEGATIVE mg/dL
Ketones, ur: NEGATIVE mg/dL
Nitrite: NEGATIVE
PH: 5.5 (ref 5.0–8.0)
PROTEIN: 30 mg/dL — AB
SPECIFIC GRAVITY, URINE: 1.025 (ref 1.005–1.030)
Urobilinogen, UA: 0.2 mg/dL (ref 0.0–1.0)

## 2015-01-29 LAB — URINE MICROSCOPIC-ADD ON

## 2015-01-29 MED ORDER — CEPHALEXIN 500 MG PO CAPS: 500.0000 mg | ORAL_CAPSULE | Freq: Four times a day (QID) | ORAL | Status: DC

## 2015-01-29 NOTE — ED Notes (Signed)
Pt c/o frequency and burning with urination for past 3 days, progressively worsening.

## 2015-01-29 NOTE — Discharge Instructions (Signed)

## 2015-01-29 NOTE — ED Provider Notes (Signed)
CSN: [id number]     Arrival date & time 01/29/15  1539 History   This chart was scribed for Sharon Essex, MD by Meriel Pica, ED Scribe. This patient was seen in room MH08/MH08 and the patient's care was started 6:21 PM.   Chief Complaint  Patient presents with  . Dysuria    The history is provided by the patient. No language interpreter was used.   HPI Comments: MALLY GAVINA is a 74 y.o. female, with a significant PMhx, who presents to the Emergency Department complaining of progressively worsening, constant dysuria onset 3 days ago. She reports associated suprapubic abdominal pain, chills, frequency and urgency, and hematuria. She reports experiencing UTIs at least 2 times per year. Pt has chronic pancreatitis and experiences intermittent nausea and vomiting for which she takes Zofran for. Additionally notes a PMhx of DM for which she takes oral medication and chronic back pain that she is prescribed tramadol for. PShx of appendectomy. She denies fevers, new or worsening nausea or vomiting, constipation, or new or differing back pain, CP, or SOB.  Pt not taking blood thinning medication.    Past Medical History  Diagnosis Date  . Diabetes mellitus   . Hypertension   . Anxiety   . Aortic valve insufficiency     mild  . Hyperlipemia   . Hypothyroidism   . Elevated liver function tests     fatty liver per CT 2009  . Diverticulosis of colon   . Osteoarthritis   . Cervical stenosis of spine     sees neuro surgery and dr.ramons s/p local injection 4/09  . Breast cancer 04/2007    s/p XRT (last 4/09)  . Urticaria 06/2009  . 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 Adenomatous polyps Dec 10, 1998  . Hemorrhoids   . Restless leg syndrome   . Difficulty in urination     pt uses cath at times  . Chronic constipation   . Full body hives     if patient does not take Fexofenadine  . Colon polyps 02/2010    Tubular  Adenomas                                     . Insomnia   .  Mitral valve regurgitation   . GERD (gastroesophageal reflux disease)   . Fatty liver   . Cervical myelopathy   . Chronic pancreatitis 09/29/2014   Past Surgical History  Procedure Laterality Date  . Breast lumpectomy Left 2009    left breast  . Appendectomy    . Cataract extraction, bilateral  2/11  . Sinus cyst removed    . Eye surgery    . Cardiac catheterization  2001  . Anterior cervical decomp/discectomy fusion N/A 01/08/2013    Procedure: Cervical three-four, four-five, five-six anterior cervical decompression with fusion plating and bonegraft;  Surgeon: Eustace Moore, MD;  Location: [place];  Service: Neurosurgery;  Laterality: N/A;  Cervical three-four, four-five, five-six anterior cervical decompression with fusion plating and bonegraft  . Ganglion cyst excision Right     wrist  . Uterine fibroid surgery     Family History  Problem Relation Age of Onset  . Coronary artery disease Neg Hx   . Hypertension Mother   . Diabetes Neg Hx   . Stroke Neg Hx   . Colon cancer Neg Hx   . Breast  cancer Neg Hx   . Lung cancer Mother     mets to brain  . Tuberculosis Father     died at 32  . Brain cancer Mother    Social History  Substance Use Topics  . Smoking status: Never Smoker   . Smokeless tobacco: Never Used  . Alcohol Use: 0.0 oz/week    0 Standard drinks or equivalent per week     Comment: rare   OB History    No data available     Review of Systems  Constitutional: Positive for chills. Negative for fever.  Respiratory: Negative for shortness of breath.   Cardiovascular: Negative for chest pain.  Gastrointestinal: Positive for abdominal pain. Negative for nausea, vomiting and constipation.  Genitourinary: Positive for dysuria, urgency, frequency and hematuria.  Musculoskeletal: Negative for back pain.  A complete 10 system review of systems was obtained and is otherwise negative except at noted in the HPI and PMH.  Allergies  Gabapentin; Iodine; Iohexol;  Nsaids; Aciphex; Ivp dye; Nexium; Pepcid; and Protonix  Home Medications   Prior to Admission medications   Medication Sig Start Date End Date Taking? Authorizing Provider  carbidopa-levodopa (PARCOPA) 25-250 MG per disintegrating tablet Take 1 tablet by mouth at bedtime.    Historical Provider, MD  cephALEXin (KEFLEX) 500 MG capsule Take 1 capsule (500 mg total) by mouth 4 (four) times daily. 01/29/15   Sharon Essex, MD  clonazePAM (KLONOPIN) 0.5 MG tablet Take 1 tablet (0.5 mg total) by mouth 2 (two) times daily as needed for anxiety. 01/05/15   Colon Branch, MD  estradiol (ESTRACE) 0.1 MG/GM vaginal cream Place 1 Applicatorful vaginally as needed.    Historical Provider, MD  glucose blood test strip Check blood sugars no more than TWICE DAILY Patient not taking: Reported on 12/10/2014 10/04/14   Colon Branch, MD  insulin glargine (LANTUS) 100 UNIT/ML injection Inject 32 Units into the skin daily.     Historical Provider, MD  levothyroxine (SYNTHROID, LEVOTHROID) 100 MCG tablet Take 1 tablet (100 mcg total) by mouth daily. 12/11/13   Colon Branch, MD  lidocaine (LIDODERM) 5 % Place 1 patch onto the skin as needed. Remove & Discard patch within 12 hours or as directed by MD    Historical Provider, MD  lisinopril (PRINIVIL,ZESTRIL) 10 MG tablet Take 1 tablet (10 mg total) by mouth daily. 12/13/14   Colon Branch, MD  metFORMIN (GLUCOPHAGE) 500 MG tablet Take 1,000 mg by mouth 2 (two) times daily with a meal. 06/16/13   Colon Branch, MD  milk thistle 175 MG tablet Take 175 mg by mouth daily.    Historical Provider, MD  omeprazole (PRILOSEC) 40 MG capsule Take 1 capsule (40 mg total) by mouth 2 (two) times daily. 10/20/14   Ladene Artist, MD  ondansetron (ZOFRAN) 4 MG tablet TAKE 1 TABLET (4 MG TOTAL) BY MOUTH EVERY 8 (EIGHT) HOURS AS NEEDED FOR NAUSEA OR VOMITING. 01/06/15   Ladene Artist, MD  [facility] DELICA LANCETS MISC Check once daily. Patient not taking: Reported on 12/10/2014 11/15/11   Colon Branch, MD   traMADol (ULTRAM) 50 MG tablet Take 2 tablets by mouth 2 (two) times daily. 11/28/14   Historical Provider, MD  triamterene-hydrochlorothiazide (MAXZIDE) 75-50 MG per tablet Take 0.5 tablets by mouth daily.      Historical Provider, MD  zolpidem (AMBIEN) 10 MG tablet Take 0.5 tablets (5 mg total) by mouth at bedtime as needed for sleep. 01/05/15  Colon Branch, MD   BP 134/62 mmHg  Pulse 80  Temp(Src) 99.9 F (37.7 C) (Oral)  Resp 18  Ht 5\' 4"  (1.626 m)  Wt 156 lb (70.761 kg)  BMI 26.76 kg/m2  SpO2 98% Physical Exam  Constitutional: She is oriented to person, place, and time. She appears well-developed and well-nourished. No distress.  HENT:  Head: Normocephalic and atraumatic.  Mouth/Throat: Oropharynx is clear and moist. No oropharyngeal exudate.  Eyes: Conjunctivae and EOM are normal. Pupils are equal, round, and reactive to light.  Neck: Normal range of motion. Neck supple.  No meningismus.  Cardiovascular: Normal rate, regular rhythm, normal heart sounds and intact distal pulses.   No murmur heard. Pulmonary/Chest: Effort normal and breath sounds normal. No respiratory distress.  Abdominal: Soft. There is tenderness. There is no rebound and no guarding.  Suprapubic tenderness; no CVA tenderness.  Musculoskeletal: Normal range of motion. She exhibits no edema or tenderness.  Neurological: She is alert and oriented to person, place, and time. No cranial nerve deficit. She exhibits normal muscle tone. Coordination normal.  No ataxia on finger to nose bilaterally. No pronator drift. 5/5 strength throughout. CN 2-12 intact. Negative Romberg. Equal grip strength. Sensation intact. Gait is normal.   Skin: Skin is warm.  Psychiatric: She has a normal mood and affect. Her behavior is normal.  Nursing note and vitals reviewed.   ED Course  Procedures  DIAGNOSTIC STUDIES: Oxygen Saturation is 98% on RA, normal by my interpretation.    COORDINATION OF CARE: 6:27 PM Discussed treatment  plan with pt. Waiting for urinalysis to result. Pt acknowledges and agrees to plan.   Labs Review Labs Reviewed  URINALYSIS, ROUTINE W REFLEX MICROSCOPIC (NOT AT [facility]) - Abnormal; Notable for the following:    APPearance CLOUDY (*)    Hgb urine dipstick LARGE (*)    Protein, ur 30 (*)    Leukocytes, UA MODERATE (*)    All other components within normal limits  URINE MICROSCOPIC-ADD ON - Abnormal; Notable for the following:    Bacteria, UA MANY (*)    Casts HYALINE CASTS (*)    All other components within normal limits  URINE CULTURE    MDM   Final diagnoses:  Urinary tract infection with hematuria, site unspecified   Three-day history of urgency, frequency and burning. Denies any fever or vomiting.  Urinalysis positive for infection. Patient abdomen is benign. No significant flank tenderness. Doubt kidney stone.  Patient treated with antibiotics. Urine culture sent. Return precautions discussed.  BP 140/82 mmHg  Pulse 69  Temp(Src) 99.9 F (37.7 C) (Oral)  Resp 18  Ht 5\' 4"  (1.626 m)  Wt 156 lb (70.761 kg)  BMI 26.76 kg/m2  SpO2 100%    I personally performed the services described in this documentation, which was scribed in my presence. The recorded information has been reviewed and is accurate.    Sharon Essex, MD 01/30/15 [phone]

## 2015-02-01 ENCOUNTER — Ambulatory Visit (INDEPENDENT_AMBULATORY_CARE_PROVIDER_SITE_OTHER): Payer: Medicare Other

## 2015-02-01 DIAGNOSIS — E538 Deficiency of other specified B group vitamins: Secondary | ICD-10-CM

## 2015-02-01 DIAGNOSIS — R5383 Other fatigue: Secondary | ICD-10-CM | POA: Diagnosis not present

## 2015-02-01 LAB — URINE CULTURE

## 2015-02-01 MED ORDER — CYANOCOBALAMIN 1000 MCG/ML IJ SOLN
1000.0000 ug | Freq: Once | INTRAMUSCULAR | Status: AC
  Administered 2015-02-01: 1000 ug via INTRAMUSCULAR

## 2015-02-01 NOTE — Progress Notes (Signed)
Pre visit review using our clinic review tool, if applicable. No additional management support is needed unless otherwise documented below in the visit note. 

## 2015-02-16 ENCOUNTER — Other Ambulatory Visit: Payer: Self-pay | Admitting: Internal Medicine

## 2015-02-17 ENCOUNTER — Other Ambulatory Visit: Payer: Self-pay | Admitting: *Deleted

## 2015-02-17 ENCOUNTER — Other Ambulatory Visit (INDEPENDENT_AMBULATORY_CARE_PROVIDER_SITE_OTHER): Payer: Medicare Other

## 2015-02-17 DIAGNOSIS — E038 Other specified hypothyroidism: Secondary | ICD-10-CM

## 2015-02-17 DIAGNOSIS — E119 Type 2 diabetes mellitus without complications: Secondary | ICD-10-CM | POA: Diagnosis not present

## 2015-02-17 LAB — COMPREHENSIVE METABOLIC PANEL
ALBUMIN: 4.2 g/dL (ref 3.5–5.2)
ALT: 42 U/L — AB (ref 0–35)
AST: 52 U/L — AB (ref 0–37)
Alkaline Phosphatase: 28 U/L — ABNORMAL LOW (ref 39–117)
BILIRUBIN TOTAL: 0.4 mg/dL (ref 0.2–1.2)
BUN: 21 mg/dL (ref 6–23)
CALCIUM: 9.9 mg/dL (ref 8.4–10.5)
CHLORIDE: 103 meq/L (ref 96–112)
CO2: 32 mEq/L (ref 19–32)
CREATININE: 0.73 mg/dL (ref 0.40–1.20)
GFR: 82.87 mL/min (ref >60.00)
Glucose, Bld: 153 mg/dL — ABNORMAL HIGH (ref 70–99)
Potassium: 4.4 mEq/L (ref 3.5–5.1)
SODIUM: 141 meq/L (ref 135–145)
TOTAL PROTEIN: 7.8 g/dL (ref 6.0–8.3)

## 2015-02-17 LAB — T4, FREE: FREE T4: 0.81 ng/dL (ref 0.60–1.60)

## 2015-02-17 LAB — TSH: TSH: 3.23 u[IU]/mL (ref 0.35–4.50)

## 2015-02-17 LAB — HEMOGLOBIN A1C: Hgb A1c MFr Bld: 6.8 % — ABNORMAL HIGH (ref 4.6–6.5)

## 2015-02-17 NOTE — Telephone Encounter (Signed)
Rx printed, awaiting MD signature.  

## 2015-02-17 NOTE — Telephone Encounter (Signed)
Ok 60 and 3 RF

## 2015-02-17 NOTE — Telephone Encounter (Signed)
Rx faxed to [facility].  

## 2015-02-17 NOTE — Telephone Encounter (Signed)
Pt is requesting refill on Clonazepam.  Last OV: 01/05/2015 Last Fill: 01/05/2015 #60 0RF UDS: 08/15/2012 Low risk  Needs UDS and controlled substance contract at next OV.  Please advise.

## 2015-02-21 ENCOUNTER — Ambulatory Visit (INDEPENDENT_AMBULATORY_CARE_PROVIDER_SITE_OTHER): Payer: Medicare Other | Admitting: Endocrinology

## 2015-02-21 ENCOUNTER — Encounter: Payer: Self-pay | Admitting: Endocrinology

## 2015-02-21 VITALS — BP 124/62 | HR 72 | Temp 98.2°F | Resp 16 | Ht 64.0 in | Wt 161.8 lb

## 2015-02-21 DIAGNOSIS — Z794 Long term (current) use of insulin: Secondary | ICD-10-CM

## 2015-02-21 DIAGNOSIS — E039 Hypothyroidism, unspecified: Secondary | ICD-10-CM

## 2015-02-21 DIAGNOSIS — E119 Type 2 diabetes mellitus without complications: Secondary | ICD-10-CM | POA: Diagnosis not present

## 2015-02-21 NOTE — Progress Notes (Signed)
Patient ID: Sharon Armstrong, female   DOB: Sep 09, 1940, 74 y.o.   MRN: [id number]           Reason for Appointment:  for Type 2 Diabetes  Referring physician: Larose Kells  History of Present Illness:          Date of diagnosis of type 2 diabetes mellitus:        Background history: She had been treated for several years with metformin and Amaryl  Her control had been fairly good until about 2012 and she thinks that because of poor control with oral agents she was given Lantus insulin in addition She has been on Lantus and metformin since then with variable control  Recent history:   INSULIN regimen is described as: 32 units of Lantus in am    She is following up after her initial consultation in 6/16. Although her A1c was previously 8.1 she had improved her control by starting a walking program and consistently watching her diet However her blood sugars are relatively higher and her weight has gone up 6 pounds This is related to her being inconsistent with her diet, stress eating and not watching what she is eating  She is also not finding the time to exercise and has had other issues preventing her from exercising    Current blood sugar patterns and problems identified:  Fasting blood sugars are reportedly nearly normal although she did not bring her monitor for download  She thinks her blood sugars at bedtime are relatively higher  Does not usually check her sugars in between meals  Has not watched her diet as above despite seeing the dietitian in June.     Oral hypoglycemic drugs the patient is taking are: Metformin 1g bid      Side effects from medications have been: None  Compliance with the medical regimen: Good  Hypoglycemia:   none recently  Glucose monitoring:  done 1-2  times a day         Glucometer:  Accucheck  Blood Glucose readings by recall   PREMEAL Breakfast Lunch Dinner Bedtime  Overall   Glucose range:  97-119   169-180   Median:         Self-care:                    Dinner at 6-7 pm Diet consultation was in 6/16               Exercise: walking 3/7 for 10-20 min  Weight history:    Wt Readings from Last 3 Encounters:  02/21/15 161 lb 12.8 oz (73.392 kg)  01/29/15 156 lb (70.761 kg)  01/05/15 155 lb 6 oz (70.478 kg)    Glycemic control:   Lab Results  Component Value Date   HGBA1C 6.8* 02/17/2015   HGBA1C 6.3 10/26/2014   HGBA1C 8.1* 08/03/2014   Lab Results  Component Value Date   MICROALBUR 1.8 10/26/2014   LDLCALC 123 08/03/2014   CREATININE 0.73 02/17/2015         Medication List       This list is accurate as of: 02/21/15 11:59 PM.  Always use your most recent med list.               ASPERCREME LIDOCAINE 4 % Ptch  Generic drug:  Lidocaine  Apply topically.     carbidopa-levodopa 25-250 MG disintegrating tablet  Commonly known as:  PARCOPA  Take 1 tablet by mouth at bedtime.  clonazePAM 0.5 MG tablet  Commonly known as:  KLONOPIN  Take 1 tablet (0.5 mg total) by mouth 2 (two) times daily as needed for anxiety.     estradiol 0.1 MG/GM vaginal cream  Commonly known as:  ESTRACE  Place 1 Applicatorful vaginally as needed.     glucose blood test strip  Check blood sugars no more than TWICE DAILY     insulin glargine 100 UNIT/ML injection  Commonly known as:  LANTUS  Inject 32 Units into the skin daily.     levothyroxine 100 MCG tablet  Commonly known as:  SYNTHROID, LEVOTHROID  Take 1 tablet (100 mcg total) by mouth daily.     lisinopril 10 MG tablet  Commonly known as:  PRINIVIL,ZESTRIL  Take 1 tablet (10 mg total) by mouth daily.     metFORMIN 500 MG tablet  Commonly known as:  GLUCOPHAGE  Take 500 mg by mouth 2 (two) times daily with a meal. Takes 2 tablets twice a day     milk thistle 175 MG tablet  Take 175 mg by mouth daily.     omeprazole 40 MG capsule  Commonly known as:  PRILOSEC  Take 1 capsule (40 mg total) by mouth 2 (two) times daily.     ondansetron 4 MG tablet    Commonly known as:  ZOFRAN  TAKE 1 TABLET (4 MG TOTAL) BY MOUTH EVERY 8 (EIGHT) HOURS AS NEEDED FOR NAUSEA OR VOMITING.     ONETOUCH DELICA LANCETS Misc  Check once daily.     traMADol 50 MG tablet  Commonly known as:  ULTRAM  Take 2 tablets by mouth 2 (two) times daily.     triamterene-hydrochlorothiazide 75-50 MG tablet  Commonly known as:  MAXZIDE  Take 0.5 tablets by mouth daily.     zolpidem 10 MG tablet  Commonly known as:  AMBIEN  Take 0.5 tablets (5 mg total) by mouth at bedtime as needed for sleep.        Allergies:  Allergies  Allergen Reactions  . Gabapentin Swelling    Edema, legs   . Iodine Hives  . Iohexol   . Nsaids Other (See Comments)    REACTION: hallucinations  . Aciphex [Rabeprazole] Rash  . Ivp Dye [Iodinated Diagnostic Agents] Hives and Rash  . Nexium [Esomeprazole Magnesium] Rash  . Pepcid [Famotidine] Rash  . Protonix [Pantoprazole Sodium] Rash    Past Medical History  Diagnosis Date  . Diabetes mellitus   . Hypertension   . Anxiety   . Aortic valve insufficiency     mild  . Hyperlipemia   . Hypothyroidism   . Elevated liver function tests     fatty liver per CT 2009  . Diverticulosis of colon   . Osteoarthritis   . Cervical stenosis of spine     sees neuro surgery and dr.ramons s/p local injection 4/09  . Breast cancer ([place]) 04/2007    s/p XRT (last 4/09)  . Urticaria 06/2009  . 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 Adenomatous polyps 1998-12-13  . Hemorrhoids   . Restless leg syndrome   . Difficulty in urination     pt uses cath at times  . Chronic constipation   . Full body hives     if patient does not take Fexofenadine  . Colon polyps 02/2010    Tubular  Adenomas                                     .  Insomnia   . Mitral valve regurgitation   . GERD (gastroesophageal reflux disease)   . Fatty liver   . Cervical myelopathy ([place])   . Chronic pancreatitis ([place]) 09/29/2014    Past Surgical History  Procedure  Laterality Date  . Breast lumpectomy Left 2009    left breast  . Appendectomy    . Cataract extraction, bilateral  2/11  . Sinus cyst removed    . Eye surgery    . Cardiac catheterization  2001  . Anterior cervical decomp/discectomy fusion N/A 01/08/2013    Procedure: Cervical three-four, four-five, five-six anterior cervical decompression with fusion plating and bonegraft;  Surgeon: Eustace Moore, MD;  Location: [facility];  Service: Neurosurgery;  Laterality: N/A;  Cervical three-four, four-five, five-six anterior cervical decompression with fusion plating and bonegraft  . Ganglion cyst excision Right     wrist  . Uterine fibroid surgery      Family History  Problem Relation Age of Onset  . Coronary artery disease Neg Hx   . Hypertension Mother   . Diabetes Neg Hx   . Stroke Neg Hx   . Colon cancer Neg Hx   . Breast cancer Neg Hx   . Lung cancer Mother     mets to brain  . Tuberculosis Father     died at 83  . Brain cancer Mother     Social History:  reports that she has never smoked. She has never used smokeless tobacco. She reports that she drinks alcohol. She reports that she does not use illicit drugs.    Review of Systems    Lipid history: Her levels are showing increased LDL, patient has not been on any lipid-lowering drugs possibly because of liver dysfunction    Lab Results  Component Value Date   CHOL 200 08/03/2014   CHOL 200 08/03/2014   HDL 47 08/03/2014   LDLCALC 123 08/03/2014   TRIG 149 08/03/2014   TRIG 149 08/03/2014   CHOLHDL 3 01/24/2011           Eyes:  Most recent eye exam was in 2/16 and has no known retinopathy  Hypertension: She is on lisinopril and Maxzide with good control  Gastrointestinal: History of fatty liver  Psychiatric: no symptoms of depression, does have occasional insomnia  Endocrine: No unusual fatigue. Long history of thyroid disease: Was told in her teen years that she was hypothyroid and this may have been a  sequela to possible radiation treatment in childhood in [place]  TSH stable   Lab Results  Component Value Date   TSH 3.23 02/17/2015   TSH 3.70 10/26/2014   TSH 3.09 04/07/2014   FREET4 0.81 02/17/2015    Foot exam in 6/16 showed:  Decreased monofilament sensation on the toes and distal plantar surfaces of the right side  Physical Examination:  BP 124/62 mmHg  Pulse 72  Temp(Src) 98.2 F (36.8 C)  Resp 16  Ht 5\' 4"  (1.626 m)  Wt 161 lb 12.8 oz (73.392 kg)  BMI 27.76 kg/m2  SpO2 97%      ASSESSMENT/PLAN:   Diabetes type 2, uncontrolled    She has worsening of her blood sugar control mostly because of poor diet and lack of consistent exercise Before June her control had improved significantly with her watching her diet and walking regularly She has already seen the dietitian and has not been able to comply for various reasons as above  Since her A1c is still reasonably good at 6.8  may not need any modification of her medications Previously had responded well to Januvia with reduction in insulin requirement and may consider this if blood sugars are not improving especially postprandially  She will call if her blood sugars do not improve within the next few weeks; she thinks she can do much better when she goes down to [place] this month and will be able to exercise also     Patient Instructions  Check blood sugars on waking up .. 3 .. times a week Also check blood sugars about 2 hours after a meal and do this after different meals by rotation  Recommended blood sugar levels on waking up is 90-130 and about 2 hours after meal is 140-160 Please bring blood sugar monitor to each visit.  Call if sugars consistenty high  More exercise     Edson Deridder 02/22/2015, 8:09 AM   Note: This office note was prepared with Estate agent. Any transcriptional errors that result from this process are unintentional. `

## 2015-02-21 NOTE — Patient Instructions (Signed)
Check blood sugars on waking up .. 3 .. times a week Also check blood sugars about 2 hours after a meal and do this after different meals by rotation  Recommended blood sugar levels on waking up is 90-130 and about 2 hours after meal is 140-160 Please bring blood sugar monitor to each visit.  Call if sugars consistenty high  More exercise

## 2015-02-22 ENCOUNTER — Telehealth: Payer: Self-pay | Admitting: Endocrinology

## 2015-02-22 NOTE — Telephone Encounter (Signed)
Patient would like for you to give her a call.

## 2015-03-05 DIAGNOSIS — M542 Cervicalgia: Secondary | ICD-10-CM | POA: Diagnosis not present

## 2015-03-05 DIAGNOSIS — M5136 Other intervertebral disc degeneration, lumbar region: Secondary | ICD-10-CM | POA: Diagnosis not present

## 2015-03-08 ENCOUNTER — Ambulatory Visit (INDEPENDENT_AMBULATORY_CARE_PROVIDER_SITE_OTHER): Payer: Medicare Other | Admitting: Internal Medicine

## 2015-03-08 ENCOUNTER — Encounter: Payer: Self-pay | Admitting: Internal Medicine

## 2015-03-08 VITALS — BP 126/76 | HR 68 | Temp 98.1°F | Ht 64.0 in | Wt 160.1 lb

## 2015-03-08 DIAGNOSIS — F411 Generalized anxiety disorder: Secondary | ICD-10-CM | POA: Diagnosis not present

## 2015-03-08 DIAGNOSIS — R5382 Chronic fatigue, unspecified: Secondary | ICD-10-CM | POA: Diagnosis not present

## 2015-03-08 DIAGNOSIS — Z23 Encounter for immunization: Secondary | ICD-10-CM

## 2015-03-08 DIAGNOSIS — Z79899 Other long term (current) drug therapy: Secondary | ICD-10-CM | POA: Diagnosis not present

## 2015-03-08 DIAGNOSIS — E538 Deficiency of other specified B group vitamins: Secondary | ICD-10-CM

## 2015-03-08 DIAGNOSIS — Z09 Encounter for follow-up examination after completed treatment for conditions other than malignant neoplasm: Secondary | ICD-10-CM | POA: Insufficient documentation

## 2015-03-08 MED ORDER — CYANOCOBALAMIN 1000 MCG/ML IJ SOLN
1000.0000 ug | Freq: Once | INTRAMUSCULAR | Status: AC
  Administered 2015-03-08: 1000 ug via INTRAMUSCULAR

## 2015-03-08 MED ORDER — ZOLPIDEM TARTRATE 10 MG PO TABS: 5.0000 mg | ORAL_TABLET | Freq: Every evening | ORAL | Status: DC | PRN

## 2015-03-08 NOTE — Patient Instructions (Signed)
    Next visit  for a routine checkup in 6 months, fasting   (30 minutes) Please schedule an appointment at the front desk

## 2015-03-08 NOTE — Progress Notes (Signed)
Subjective:    Patient ID: Sharon Armstrong, female    DOB: 10/11/1940, 74 y.o.   MRN: [id number]  DOS:  03/08/2015 Type of visit - description : Follow-up from previous visit Interval history: Since the last office visit, started clonazepam, it is helping significantly with anxiety and somewhat with insomnia. Chronic fatigue: Continue to be an issue despite taking B12 shots. Is concerned about her alkaline phosphate being in the low side. Concerned about taking carbidopa levodopa Concern about the diagnosis of pancreatitis. Denies any abdominal pain, nausea, vomiting.   Review of Systems Was diagnosed with a UTI recently, denies fever, chills. No dysuria or gross hematuria. As far as her chronic fatigue, I wonder if she has a sleep apnea, she admits to feeling sleepy but denies snoring or episodes of apnea.   Past Medical History  Diagnosis Date  . Diabetes mellitus   . Hypertension   . Anxiety   . Aortic valve insufficiency     mild  . Hyperlipemia   . Hypothyroidism   . Elevated liver function tests     fatty liver per CT 2009  . Diverticulosis of colon   . Osteoarthritis   . Cervical stenosis of spine     sees neuro surgery and dr.ramons s/p local injection 4/09  . Breast cancer ([place]) 04/2007    s/p XRT (last 4/09)  . Urticaria 06/2009  . 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 Adenomatous polyps 12-07-98  . Hemorrhoids   . Restless leg syndrome   . Difficulty in urination     pt uses cath at times  . Chronic constipation   . Full body hives     if patient does not take Fexofenadine  . Colon polyps 02/2010    Tubular  Adenomas                                     . Insomnia   . Mitral valve regurgitation   . GERD (gastroesophageal reflux disease)   . Fatty liver   . Cervical myelopathy ([place])   . Chronic pancreatitis ([place]) 09/29/2014    Past Surgical History  Procedure Laterality Date  . Breast lumpectomy Left 2009    left breast  . Appendectomy     . Cataract extraction, bilateral  2/11  . Sinus cyst removed    . Eye surgery    . Cardiac catheterization  2001  . Anterior cervical decomp/discectomy fusion N/A 01/08/2013    Procedure: Cervical three-four, four-five, five-six anterior cervical decompression with fusion plating and bonegraft;  Surgeon: Eustace Moore, MD;  Location: [facility];  Service: Neurosurgery;  Laterality: N/A;  Cervical three-four, four-five, five-six anterior cervical decompression with fusion plating and bonegraft  . Ganglion cyst excision Right     wrist  . Uterine fibroid surgery      Social History   Social History  . Marital Status: Married    Spouse Name: N/A  . Number of Children: 2  . Years of Education: N/A   Occupational History  . custom decorator    Social History Main Topics  . Smoking status: Never Smoker   . Smokeless tobacco: Never Used  . Alcohol Use: 0.0 oz/week    0 Standard drinks or equivalent per week     Comment: rare  . Drug Use: No  . Sexual Activity: Not on file  Other Topics Concern  . Not on file   Social History Narrative   Lives in [place] half of the time            Medication List       This list is accurate as of: 03/08/15  6:19 PM.  Always use your most recent med list.               ASPERCREME LIDOCAINE 4 % Ptch  Generic drug:  Lidocaine  Apply topically.     carbidopa-levodopa 25-250 MG disintegrating tablet  Commonly known as:  PARCOPA  Take 1 tablet by mouth at bedtime.     clonazePAM 0.5 MG tablet  Commonly known as:  KLONOPIN  Take 1 tablet (0.5 mg total) by mouth 2 (two) times daily as needed for anxiety.     estradiol 0.1 MG/GM vaginal cream  Commonly known as:  ESTRACE  Place 1 Applicatorful vaginally as needed.     glucose blood test strip  Check blood sugars no more than TWICE DAILY     insulin glargine 100 UNIT/ML injection  Commonly known as:  LANTUS  Inject 32 Units into the skin daily.     levothyroxine 100 MCG  tablet  Commonly known as:  SYNTHROID, LEVOTHROID  Take 1 tablet (100 mcg total) by mouth daily.     lisinopril 10 MG tablet  Commonly known as:  PRINIVIL,ZESTRIL  Take 1 tablet (10 mg total) by mouth daily.     metFORMIN 500 MG tablet  Commonly known as:  GLUCOPHAGE  Take 500 mg by mouth 2 (two) times daily with a meal. Takes 2 tablets twice a day     milk thistle 175 MG tablet  Take 175 mg by mouth daily.     omeprazole 40 MG capsule  Commonly known as:  PRILOSEC  Take 1 capsule (40 mg total) by mouth 2 (two) times daily.     ondansetron 4 MG tablet  Commonly known as:  ZOFRAN  TAKE 1 TABLET (4 MG TOTAL) BY MOUTH EVERY 8 (EIGHT) HOURS AS NEEDED FOR NAUSEA OR VOMITING.     ONETOUCH DELICA LANCETS Misc  Check once daily.     traMADol 50 MG tablet  Commonly known as:  ULTRAM  Take 2 tablets by mouth 2 (two) times daily.     triamterene-hydrochlorothiazide 75-50 MG tablet  Commonly known as:  MAXZIDE  Take 0.5 tablets by mouth daily.     zolpidem 10 MG tablet  Commonly known as:  AMBIEN  Take 0.5 tablets (5 mg total) by mouth at bedtime as needed for sleep.           Objective:   Physical Exam BP 126/76 mmHg  Pulse 68  Temp(Src) 98.1 F (36.7 C) (Oral)  Ht 5\' 4"  (1.626 m)  Wt 160 lb 2 oz (72.632 kg)  BMI 27.47 kg/m2  SpO2 96%  General:   Well developed, well nourished . NAD.  HEENT:  Normocephalic . Face symmetric, atraumatic Lungs:  CTA B Normal respiratory effort, no intercostal retractions, no accessory muscle use. Heart: RRR,  no murmur.  no pretibial edema bilaterally  Abdomen:  Not distended, soft, non-tender. No rebound or rigidity. No mass,organomegaly Skin: Not pale. Not jaundice Neurologic:  alert & oriented X3.  Speech normal, gait appropriate for age and unassisted Psych--  Cognition and judgment appear intact.  Cooperative with normal attention span and concentration.  Behavior appropriate. No anxious or depressed appearing.      Assessment &  Plan:   Assessment> DM -- Dr Dwyane Dee HTN Hyperlipidemia Hypothyroidism Anxiety , insomnia Chronic fatigue (rx empiric B12 shot 12-2014) RLS  MSK: on ultram  --Spinal cervical stenosis, local injections, surgery 12-2012 --Low back pain-- Dr Nelva Bush --DJD  GI: dr Fuller Plan, last OV 10-2014  --GERD, --Chronic pancreatitis, h/o episodes of acute pancreatitis --h/o fatty liver and ? Of early cirrhosis by MRI --Chronic constipation Breast cancer dx 2008 XRT H/o +PPD H/o Urticaria H/ o Mild aortic valve insufficiency  Plan Anxiety: Started on clonazepam  01/05/2015, working well for her, also helping with insomnia. Will continue with same medications, RF as needed and check a UDS Chronic fatigue: See previous entries, unchanged despite taking B12 shots. Recommend to see a pulmonary specialist to rule out a sleep apnea, not inclined to proceed in that route. Wonders if she could continue with the B12, I see no contraindication Multiple questions answered to the best of my ability regards alkaline phosphate and taking Sinemet carbidopa levodopa. Patient is reassured Primary care: Td , flu shot today. RTC: 6 months, will be in [place] under the care of Dr. Danise Mina  .

## 2015-03-08 NOTE — Progress Notes (Signed)
Pre visit review using our clinic review tool, if applicable. No additional management support is needed unless otherwise documented below in the visit note. 

## 2015-03-08 NOTE — Assessment & Plan Note (Signed)
Anxiety: Started on clonazepam  01/05/2015, working well for her, also helping with insomnia. Will continue with same medications, RF as needed and check a UDS Chronic fatigue: See previous entries, unchanged despite taking B12 shots. Recommend to see a pulmonary specialist to rule out a sleep apnea, not inclined to proceed in that route. Wonders if she could continue with the B12, I see no contraindication Multiple questions answered to the best of my ability regards alkaline phosphate and taking Sinemet carbidopa levodopa. Patient is reassured Primary care: Td , flu shot today. RTC: 6 months, will be in [place] under the care of Dr. Danise Mina

## 2015-03-09 DIAGNOSIS — M5136 Other intervertebral disc degeneration, lumbar region: Secondary | ICD-10-CM | POA: Diagnosis not present

## 2015-03-15 ENCOUNTER — Telehealth: Payer: Self-pay

## 2015-03-15 NOTE — Telephone Encounter (Signed)
UDS: 03/08/2015  Positive for Clonazepam Positive for Ambien Positive for Tramadol   Low risk per Dr. Larose Kells 03/15/2015

## 2015-04-18 DIAGNOSIS — G2581 Restless legs syndrome: Secondary | ICD-10-CM | POA: Diagnosis not present

## 2015-04-20 ENCOUNTER — Telehealth: Payer: Self-pay | Admitting: *Deleted

## 2015-04-20 DIAGNOSIS — I10 Essential (primary) hypertension: Secondary | ICD-10-CM | POA: Diagnosis not present

## 2015-04-20 DIAGNOSIS — K219 Gastro-esophageal reflux disease without esophagitis: Secondary | ICD-10-CM | POA: Diagnosis not present

## 2015-04-20 DIAGNOSIS — E119 Type 2 diabetes mellitus without complications: Secondary | ICD-10-CM | POA: Diagnosis not present

## 2015-04-20 DIAGNOSIS — G4700 Insomnia, unspecified: Secondary | ICD-10-CM | POA: Diagnosis not present

## 2015-04-20 DIAGNOSIS — F419 Anxiety disorder, unspecified: Secondary | ICD-10-CM | POA: Diagnosis not present

## 2015-04-20 DIAGNOSIS — E039 Hypothyroidism, unspecified: Secondary | ICD-10-CM | POA: Diagnosis not present

## 2015-04-20 DIAGNOSIS — Z0000 Encounter for general adult medical examination without abnormal findings: Secondary | ICD-10-CM | POA: Diagnosis not present

## 2015-04-20 DIAGNOSIS — E538 Deficiency of other specified B group vitamins: Secondary | ICD-10-CM | POA: Diagnosis not present

## 2015-04-20 NOTE — Telephone Encounter (Signed)
Pt signed ROI received from [facility] for medical records from July 2016 to present. Printed and faxed to [phone] successfully. Release sent for scanning. JG//CMA

## 2015-05-10 DIAGNOSIS — I10 Essential (primary) hypertension: Secondary | ICD-10-CM | POA: Diagnosis not present

## 2015-05-10 DIAGNOSIS — I340 Nonrheumatic mitral (valve) insufficiency: Secondary | ICD-10-CM | POA: Diagnosis not present

## 2015-05-12 DIAGNOSIS — R1013 Epigastric pain: Secondary | ICD-10-CM | POA: Diagnosis not present

## 2015-05-12 DIAGNOSIS — R935 Abnormal findings on diagnostic imaging of other abdominal regions, including retroperitoneum: Secondary | ICD-10-CM | POA: Diagnosis not present

## 2015-05-13 DIAGNOSIS — R1013 Epigastric pain: Secondary | ICD-10-CM | POA: Diagnosis not present

## 2015-05-13 DIAGNOSIS — R110 Nausea: Secondary | ICD-10-CM | POA: Diagnosis not present

## 2015-05-13 DIAGNOSIS — K2970 Gastritis, unspecified, without bleeding: Secondary | ICD-10-CM | POA: Diagnosis not present

## 2015-05-13 DIAGNOSIS — K319 Disease of stomach and duodenum, unspecified: Secondary | ICD-10-CM | POA: Diagnosis not present

## 2015-05-13 DIAGNOSIS — R935 Abnormal findings on diagnostic imaging of other abdominal regions, including retroperitoneum: Secondary | ICD-10-CM | POA: Diagnosis not present

## 2015-05-13 DIAGNOSIS — R933 Abnormal findings on diagnostic imaging of other parts of digestive tract: Secondary | ICD-10-CM | POA: Diagnosis not present

## 2015-05-24 DIAGNOSIS — I10 Essential (primary) hypertension: Secondary | ICD-10-CM | POA: Diagnosis not present

## 2015-05-24 DIAGNOSIS — E538 Deficiency of other specified B group vitamins: Secondary | ICD-10-CM | POA: Diagnosis not present

## 2015-06-02 ENCOUNTER — Telehealth: Payer: Self-pay

## 2015-06-02 NOTE — Telephone Encounter (Signed)
Called patient to scheduled schedule AWV

## 2015-06-09 DIAGNOSIS — M810 Age-related osteoporosis without current pathological fracture: Secondary | ICD-10-CM | POA: Diagnosis not present

## 2015-06-09 DIAGNOSIS — C50412 Malignant neoplasm of upper-outer quadrant of left female breast: Secondary | ICD-10-CM | POA: Diagnosis not present

## 2015-06-15 DIAGNOSIS — R112 Nausea with vomiting, unspecified: Secondary | ICD-10-CM | POA: Diagnosis not present

## 2015-06-15 DIAGNOSIS — R933 Abnormal findings on diagnostic imaging of other parts of digestive tract: Secondary | ICD-10-CM | POA: Diagnosis not present

## 2015-06-15 DIAGNOSIS — K30 Functional dyspepsia: Secondary | ICD-10-CM | POA: Diagnosis not present

## 2015-06-16 DIAGNOSIS — K909 Intestinal malabsorption, unspecified: Secondary | ICD-10-CM | POA: Diagnosis not present

## 2015-06-20 DIAGNOSIS — G2581 Restless legs syndrome: Secondary | ICD-10-CM | POA: Diagnosis not present

## 2015-06-29 DIAGNOSIS — K30 Functional dyspepsia: Secondary | ICD-10-CM | POA: Diagnosis not present

## 2015-06-29 DIAGNOSIS — R112 Nausea with vomiting, unspecified: Secondary | ICD-10-CM | POA: Diagnosis not present

## 2015-07-05 DIAGNOSIS — Z0001 Encounter for general adult medical examination with abnormal findings: Secondary | ICD-10-CM | POA: Diagnosis not present

## 2015-07-05 DIAGNOSIS — R935 Abnormal findings on diagnostic imaging of other abdominal regions, including retroperitoneum: Secondary | ICD-10-CM | POA: Diagnosis not present

## 2015-07-05 DIAGNOSIS — K30 Functional dyspepsia: Secondary | ICD-10-CM | POA: Diagnosis not present

## 2015-07-05 DIAGNOSIS — R112 Nausea with vomiting, unspecified: Secondary | ICD-10-CM | POA: Diagnosis not present

## 2015-07-05 DIAGNOSIS — E782 Mixed hyperlipidemia: Secondary | ICD-10-CM | POA: Diagnosis not present

## 2015-07-05 LAB — CBC AND DIFFERENTIAL
HCT: 36 % (ref 36–46)
Hemoglobin: 12 g/dL (ref 12.0–16.0)
Neutrophils Absolute: 3 /uL
Platelets: 146 10*3/uL — AB (ref 150–399)
WBC: 6.4 10^3/mL

## 2015-07-05 LAB — HEPATIC FUNCTION PANEL
ALT: 38 U/L — AB (ref 7–35)
AST: 34 U/L (ref 13–35)
Alkaline Phosphatase: 36 U/L (ref 25–125)
BILIRUBIN, TOTAL: 0.4 mg/dL

## 2015-07-05 LAB — BASIC METABOLIC PANEL
BUN: 21 mg/dL (ref 4–21)
Creatinine: 0.8 mg/dL (ref 0.5–1.1)
GLUCOSE: 134 mg/dL
POTASSIUM: 4.7 mmol/L (ref 3.4–5.3)
SODIUM: 141 mmol/L (ref 137–147)

## 2015-07-11 ENCOUNTER — Other Ambulatory Visit: Payer: Self-pay | Admitting: Internal Medicine

## 2015-07-20 DIAGNOSIS — K30 Functional dyspepsia: Secondary | ICD-10-CM | POA: Diagnosis not present

## 2015-07-20 DIAGNOSIS — R110 Nausea: Secondary | ICD-10-CM | POA: Diagnosis not present

## 2015-07-29 DIAGNOSIS — K30 Functional dyspepsia: Secondary | ICD-10-CM | POA: Diagnosis not present

## 2015-07-29 DIAGNOSIS — K9049 Malabsorption due to intolerance, not elsewhere classified: Secondary | ICD-10-CM | POA: Diagnosis not present

## 2015-07-29 DIAGNOSIS — K9089 Other intestinal malabsorption: Secondary | ICD-10-CM | POA: Diagnosis not present

## 2015-07-29 DIAGNOSIS — K760 Fatty (change of) liver, not elsewhere classified: Secondary | ICD-10-CM | POA: Diagnosis not present

## 2015-07-29 DIAGNOSIS — R110 Nausea: Secondary | ICD-10-CM | POA: Diagnosis not present

## 2015-07-29 DIAGNOSIS — E538 Deficiency of other specified B group vitamins: Secondary | ICD-10-CM | POA: Diagnosis not present

## 2015-07-29 DIAGNOSIS — E039 Hypothyroidism, unspecified: Secondary | ICD-10-CM | POA: Diagnosis not present

## 2015-07-29 DIAGNOSIS — E119 Type 2 diabetes mellitus without complications: Secondary | ICD-10-CM | POA: Diagnosis not present

## 2015-08-02 ENCOUNTER — Telehealth: Payer: Self-pay

## 2015-08-02 NOTE — Telephone Encounter (Signed)
Last Prolia given in [place] in 05/2015, next due 11/2015. Insurance Verification Process started, request form sent for scanning into chart.

## 2015-08-08 ENCOUNTER — Other Ambulatory Visit: Payer: Self-pay | Admitting: Internal Medicine

## 2015-08-09 NOTE — Telephone Encounter (Signed)
Rx printed, awaiting MD signature.  

## 2015-08-09 NOTE — Telephone Encounter (Signed)
Rx faxed to [facility] in [facility].

## 2015-08-09 NOTE — Telephone Encounter (Signed)
Okay #60 and 3 refills 

## 2015-08-09 NOTE — Telephone Encounter (Signed)
Pt is requesting refill on Clonazepam.  Last OV: 03/08/2015 Last Fill: 02/17/2015 #60 and 3RF UDS: 03/08/2015 Low risk  Please advise.

## 2015-08-10 DIAGNOSIS — Z1231 Encounter for screening mammogram for malignant neoplasm of breast: Secondary | ICD-10-CM | POA: Diagnosis not present

## 2015-08-11 DIAGNOSIS — E119 Type 2 diabetes mellitus without complications: Secondary | ICD-10-CM | POA: Diagnosis not present

## 2015-08-11 DIAGNOSIS — E039 Hypothyroidism, unspecified: Secondary | ICD-10-CM | POA: Diagnosis not present

## 2015-08-11 DIAGNOSIS — I10 Essential (primary) hypertension: Secondary | ICD-10-CM | POA: Diagnosis not present

## 2015-08-11 DIAGNOSIS — E538 Deficiency of other specified B group vitamins: Secondary | ICD-10-CM | POA: Diagnosis not present

## 2015-08-15 ENCOUNTER — Other Ambulatory Visit: Payer: Self-pay | Admitting: Endocrinology

## 2015-08-15 DIAGNOSIS — E10311 Type 1 diabetes mellitus with unspecified diabetic retinopathy with macular edema: Secondary | ICD-10-CM | POA: Diagnosis not present

## 2015-08-15 DIAGNOSIS — H524 Presbyopia: Secondary | ICD-10-CM | POA: Diagnosis not present

## 2015-08-24 DIAGNOSIS — G2581 Restless legs syndrome: Secondary | ICD-10-CM | POA: Diagnosis not present

## 2015-08-29 ENCOUNTER — Telehealth: Payer: Self-pay | Admitting: Internal Medicine

## 2015-08-29 DIAGNOSIS — K9049 Malabsorption due to intolerance, not elsewhere classified: Secondary | ICD-10-CM | POA: Diagnosis not present

## 2015-08-29 DIAGNOSIS — K861 Other chronic pancreatitis: Secondary | ICD-10-CM

## 2015-08-29 DIAGNOSIS — K219 Gastro-esophageal reflux disease without esophagitis: Secondary | ICD-10-CM

## 2015-08-29 DIAGNOSIS — K6389 Other specified diseases of intestine: Secondary | ICD-10-CM | POA: Diagnosis not present

## 2015-08-29 NOTE — Telephone Encounter (Signed)
        Type Date User   Provider Comments 08/29/2015 11:48 AM Shamarion Coots, Vilma Prader D        Summary   Provider Comments        Note   Pt of Dr. Fuller Plan, requesting to be seen elsewhere

## 2015-08-29 NOTE — Telephone Encounter (Signed)
Caller name: Self  Can be reached: [phone]   Reason for call: Request a referral to a GI doctor. States she did not like the one she was referred to and would like to see someone else for follow up

## 2015-08-29 NOTE — Telephone Encounter (Signed)
Patient called back because she received a call from the same GI that she had seen and does not want to see again (Dr, Fuller Plan). Please refer her to another GI.

## 2015-08-29 NOTE — Telephone Encounter (Signed)
Referral placed.

## 2015-08-30 DIAGNOSIS — K6389 Other specified diseases of intestine: Secondary | ICD-10-CM | POA: Diagnosis not present

## 2015-08-31 ENCOUNTER — Ambulatory Visit: Payer: Medicare Other | Admitting: Endocrinology

## 2015-08-31 ENCOUNTER — Other Ambulatory Visit: Payer: Medicare Other

## 2015-09-05 ENCOUNTER — Ambulatory Visit: Payer: Medicare Other | Admitting: Endocrinology

## 2015-09-06 ENCOUNTER — Telehealth: Payer: Self-pay | Admitting: Internal Medicine

## 2015-09-06 NOTE — Telephone Encounter (Addendum)
Pt has appt tomorrow (09/07/2015) w/ Dr. Larose Kells. Restarting B12 injections can be discussed then.

## 2015-09-06 NOTE — Telephone Encounter (Signed)
Relation to [id number] Call back number:[phone]   Reason for call:  Patient would like to schedule B12 injection requesting orders.

## 2015-09-07 ENCOUNTER — Ambulatory Visit (INDEPENDENT_AMBULATORY_CARE_PROVIDER_SITE_OTHER): Payer: Medicare Other | Admitting: Internal Medicine

## 2015-09-07 ENCOUNTER — Encounter: Payer: Self-pay | Admitting: Internal Medicine

## 2015-09-07 VITALS — BP 124/68 | HR 57 | Temp 98.0°F | Ht 64.0 in | Wt 158.2 lb

## 2015-09-07 DIAGNOSIS — R110 Nausea: Secondary | ICD-10-CM

## 2015-09-07 DIAGNOSIS — Z09 Encounter for follow-up examination after completed treatment for conditions other than malignant neoplasm: Secondary | ICD-10-CM

## 2015-09-07 DIAGNOSIS — I10 Essential (primary) hypertension: Secondary | ICD-10-CM

## 2015-09-07 DIAGNOSIS — E039 Hypothyroidism, unspecified: Secondary | ICD-10-CM | POA: Diagnosis not present

## 2015-09-07 DIAGNOSIS — E119 Type 2 diabetes mellitus without complications: Secondary | ICD-10-CM | POA: Diagnosis not present

## 2015-09-07 DIAGNOSIS — E538 Deficiency of other specified B group vitamins: Secondary | ICD-10-CM | POA: Diagnosis not present

## 2015-09-07 DIAGNOSIS — K219 Gastro-esophageal reflux disease without esophagitis: Secondary | ICD-10-CM

## 2015-09-07 DIAGNOSIS — E785 Hyperlipidemia, unspecified: Secondary | ICD-10-CM

## 2015-09-07 DIAGNOSIS — F411 Generalized anxiety disorder: Secondary | ICD-10-CM

## 2015-09-07 LAB — TSH: TSH: 3.58 u[IU]/mL (ref 0.35–4.50)

## 2015-09-07 LAB — LIPID PANEL
CHOL/HDL RATIO: 4
CHOLESTEROL: 171 mg/dL (ref 0–200)
HDL: 44.7 mg/dL (ref >39.00)
LDL CALC: 105 mg/dL — AB (ref 0–99)
NonHDL: 126.49
TRIGLYCERIDES: 108 mg/dL (ref 0.0–149.0)
VLDL: 21.6 mg/dL (ref 0.0–40.0)

## 2015-09-07 MED ORDER — CYANOCOBALAMIN 1000 MCG/ML IJ SOLN
1000.0000 ug | Freq: Once | INTRAMUSCULAR | Status: AC
  Administered 2015-09-07: 1000 ug via INTRAMUSCULAR

## 2015-09-07 NOTE — Progress Notes (Signed)
Pre visit review using our clinic review tool, if applicable. No additional management support is needed unless otherwise documented below in the visit note. 

## 2015-09-07 NOTE — Patient Instructions (Signed)
GO TO THE LAB :      Get the blood work     GO TO THE FRONT DESK  Schedule your next appointment for a  routine checkup in 3 months. No fasting   Come back once a month for B12 shot

## 2015-09-07 NOTE — Progress Notes (Signed)
Subjective:    Patient ID: Sharon Armstrong, female    DOB: 19-Apr-1941, 75 y.o.   MRN: [id number]  DOS:  09/07/2015 Type of visit - description : Acute visit Interval history: GI: Continue with nausea, vomiting, extensive workup in [place], see below. Currently on Cipro  DM: Reports last A1c was 6.4 last month HTN: BP was a slightly decreased, lisinopril dose decreased. Anxiety, insomnia: Currently well controlled B12 Injections: Request to continue with shots.  Records from [place] EGD 05/13/2015: Normal esophagus, erythematous mucosa and stomach. Endoscopic ultrasound showed dilated main pancreatic duct 05/13/2015, gastric biopsy show chronic inactive gastritis, negative H. pylori 06/29/2015 gastric emptying study with significant delay 07/05/2015: Potassium 4.7, creatinine 0.8, ALT 38, AST 34 both normal. Alkaline phosphatase 36 actually low. Hemoglobin 12.0, platelets 146. Alpha-fetoprotein 2.4 normal 06/15/2015: Saw GI 07/20/2015, saw GI: SIBO test +, was treated with ? (per pt is in the 3th abx round, currently on cipro x 21 days)   Review of Systems   denies fever, chills or weight loss No chest pain or difficulty breathing Past Medical History  Diagnosis Date  . Diabetes mellitus   . Hypertension   . Anxiety   . Aortic valve insufficiency     mild  . Hyperlipemia   . Hypothyroidism   . Elevated liver function tests     fatty liver per CT 2009  . Diverticulosis of colon   . Osteoarthritis   . Cervical stenosis of spine     sees neuro surgery and dr.ramons s/p local injection 4/09  . Breast cancer ([place]) 04/2007    s/p XRT (last 4/09)  . Urticaria 06/2009  . 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 Adenomatous polyps 12/16/98  . Hemorrhoids   . Restless leg syndrome   . Difficulty in urination     pt uses cath at times  . Chronic constipation   . Full body hives     if patient does not take Fexofenadine  . Colon polyps 02/2010    Tubular  Adenomas                                      . Insomnia   . Mitral valve regurgitation   . GERD (gastroesophageal reflux disease)   . Fatty liver   . Cervical myelopathy ([place])   . Chronic pancreatitis ([place]) 09/29/2014  . Lumbar spondylosis     Lumbar epidural steroid injection L5-S1 to the left, Dr. Nelva Bush    Past Surgical History  Procedure Laterality Date  . Breast lumpectomy Left 2009    left breast  . Appendectomy    . Cataract extraction, bilateral  2/11  . Sinus cyst removed    . Eye surgery    . Cardiac catheterization  2001  . Anterior cervical decomp/discectomy fusion N/A 01/08/2013    Procedure: Cervical three-four, four-five, five-six anterior cervical decompression with fusion plating and bonegraft;  Surgeon: Eustace Moore, MD;  Location: [facility];  Service: Neurosurgery;  Laterality: N/A;  Cervical three-four, four-five, five-six anterior cervical decompression with fusion plating and bonegraft  . Ganglion cyst excision Right     wrist  . Uterine fibroid surgery      Social History   Social History  . Marital Status: Married    Spouse Name: N/A  . Number of Children: 2  . Years of Education: N/A  Occupational History  . custom decorator    Social History Main Topics  . Smoking status: Never Smoker   . Smokeless tobacco: Never Used  . Alcohol Use: 0.0 oz/week    0 Standard drinks or equivalent per week     Comment: rare  . Drug Use: No  . Sexual Activity: Not on file   Other Topics Concern  . Not on file   Social History Narrative   Lives in [place] half of the time            Medication List       This list is accurate as of: 09/07/15  6:26 PM.  Always use your most recent med list.               ASPERCREME LIDOCAINE 4 % Ptch  Generic drug:  Lidocaine  Apply topically.     carbidopa-levodopa 25-250 MG disintegrating tablet  Commonly known as:  PARCOPA  Take 3 tablets by mouth at bedtime.     clonazePAM 0.5 MG tablet  Commonly known as:   KLONOPIN  Take 1 tablet (0.5 mg total) by mouth 2 (two) times daily as needed for anxiety.     estradiol 0.1 MG/GM vaginal cream  Commonly known as:  ESTRACE  Place 1 Applicatorful vaginally as needed. Reported on 09/07/2015     glucose blood test strip  Check blood sugars no more than TWICE DAILY     insulin glargine 100 UNIT/ML injection  Commonly known as:  LANTUS  Inject 32-38 Units into the skin daily.     levothyroxine 100 MCG tablet  Commonly known as:  SYNTHROID, LEVOTHROID  Take 1 tablet (100 mcg total) by mouth daily.     lisinopril 5 MG tablet  Commonly known as:  PRINIVIL,ZESTRIL  Take 5 mg by mouth daily.     meloxicam 7.5 MG tablet  Commonly known as:  MOBIC  Take 7.5 mg by mouth daily as needed for pain.     metFORMIN 500 MG tablet  Commonly known as:  GLUCOPHAGE  Take 1,000 mg by mouth 2 (two) times daily with a meal.     milk thistle 175 MG tablet  Take 175 mg by mouth daily.     omeprazole 40 MG capsule  Commonly known as:  PRILOSEC  Take 1 capsule (40 mg total) by mouth 2 (two) times daily.     ondansetron 4 MG tablet  Commonly known as:  ZOFRAN  TAKE 1 TABLET (4 MG TOTAL) BY MOUTH EVERY 8 (EIGHT) HOURS AS NEEDED FOR NAUSEA OR VOMITING.     ONETOUCH DELICA LANCETS Misc  Check once daily.     traMADol 50 MG tablet  Commonly known as:  ULTRAM  Take 2 tablets by mouth 2 (two) times daily.     triamterene-hydrochlorothiazide 75-50 MG tablet  Commonly known as:  MAXZIDE  Take 0.5 tablets by mouth daily.           Objective:   Physical Exam BP 124/68 mmHg  Pulse 57  Temp(Src) 98 F (36.7 C) (Oral)  Ht 5\' 4"  (1.626 m)  Wt 158 lb 4 oz (71.782 kg)  BMI 27.15 kg/m2  SpO2 97% General:   Well developed, well nourished . NAD.  HEENT:  Normocephalic . Face symmetric, atraumatic Lungs:  CTA B Normal respiratory effort, no intercostal retractions, no accessory muscle use. Heart: RRR,  no murmur.  no pretibial edema bilaterally  Abdomen:    Not distended, soft, slightly TTP at  the upper abdomen, no mass. No rebound or rigidity.  Skin: Not pale. Not jaundice Neurologic:  alert & oriented X3.  Speech normal, gait appropriate for age and unassisted Psych--  Cognition and judgment appear intact.  Cooperative with normal attention span and concentration.  Behavior appropriate. No anxious or depressed appearing.    Assessment & Plan:   Assessment> DM -- used to see Dr Dwyane Dee HTN Hyperlipidemia Hypothyroidism Anxiety , insomnia Chronic fatigue (rx empiric B12 shot 12-2014) RLS  MSK: on ultram  --Spinal cervical stenosis, local injections, surgery 12-2012 --Low back pain-- Dr Nelva Bush --[facility]  GI: dr Fuller Plan, last OV 10-2014  --GERD, --Chronic pancreatitis, h/o episodes of acute pancreatitis --h/o fatty liver and ? Of early cirrhosis by MRI --Chronic constipation --Nausea, vomiting, eval in [place]~ 12- 2016: Delayed gastric emptying, EGD biopsy no H. Pylori,SIBO +, Rx ABX Breast cancer dx 2008 XRT H/o +PPD H/o Urticaria H/ o Mild aortic valve insufficiency  PLAN: DM: Reports the A1c was 6.4 last month, had to decrease Lantus from 38to 32 units daily most of the time. Plan: Continue metformin and Lantus. HTN: Well-controlled, had to decrease lisinopril from 10 mg to 5 mg. Hyperlipidemia: Check labs, on no medications Hypothyroidism: On meds. check a TSH Anxiety insomnia: Doing great with clonazepam, not taking Ambien. GI, persistent nausea and vomiting, extensive workup in [place]. She has + gastric emptying study but her GI MD is treating her for bacterial overgrowth, currently on her third round of antibiotics and feeling slightly better. According to the notes he will treat with antibiotics before he considers Reglan. Additionally was told she does not have cirrhosis or chronic pancreatitis by her GI in [place] Would like to establish with a GI doctor different than the one she used to see in [place];   needs a ongoing GI  care. Will refer to another local GI. B12 injections: Likes to continue with empiric injections. RTC 3 months

## 2015-09-07 NOTE — Assessment & Plan Note (Signed)
DM: Reports the A1c was 6.4 last month, had to decrease Lantus from 38to 32 units daily most of the time. Plan: Continue metformin and Lantus. HTN: Well-controlled, had to decrease lisinopril from 10 mg to 5 mg. Hyperlipidemia: Check labs, on no medications Hypothyroidism: On meds. check a TSH Anxiety insomnia: Doing great with clonazepam, not taking Ambien. GI, persistent nausea and vomiting, extensive workup in [place]. She has + gastric emptying study but her GI MD is treating her for bacterial overgrowth, currently on her third round of antibiotics and feeling slightly better. According to the notes he will treat with antibiotics before he considers Reglan. Additionally was told she does not have cirrhosis or chronic pancreatitis by her GI in [place] Would like to establish with a GI doctor different than the one she used to see in [place];   needs a ongoing GI care. Will refer to another local GI. B12 injections: Likes to continue with empiric injections. RTC 3 months

## 2015-09-08 ENCOUNTER — Encounter: Payer: Self-pay | Admitting: Internal Medicine

## 2015-09-12 ENCOUNTER — Telehealth: Payer: Self-pay | Admitting: Internal Medicine

## 2015-09-12 NOTE — Telephone Encounter (Signed)
Pt called in because she says that she still hasnt heard anything back from [facility], (GI referral) She is requesting a call back to discuss further.    CB: [phone] or [phone]

## 2015-09-12 NOTE — Telephone Encounter (Signed)
Will inquire tomorrow with [facility], as referral placed on 09/07/15 was to [facility] and their notes state that they LM OM for pt to call back to schedule appt; if this were to be for [facility] [?], I will have to verify with patient/SLS 04/24

## 2015-09-13 NOTE — Telephone Encounter (Signed)
Spoke with patient and advised [facility] will not see her because she has seen Dr Fuller Plan in the past.  We sent the referral to Dr Collene Mares at [facility] on 4-20.  We are waiting for Dr Collene Mares to review the records and then they will call to schedule with the patient.  Advised the patient and gave her their phone number in case she doesn't hear from them in the next few days ([phone])

## 2015-09-21 NOTE — Telephone Encounter (Signed)
Pt requesting call back today at cell # [phone]

## 2015-09-21 NOTE — Telephone Encounter (Signed)
Pt states that she called Dr. Lorie Apley office to f/u and they told her she needs to call Dr. Larose Kells. She doesn't know what Dr. Larose Kells is to do. She is not sure what to does and she said that she is needing a test done soon after stopping abx meds and needs our help. They didn't tell her they will not see her, they told her to call Dr. Larose Kells and go thru him.

## 2015-09-22 NOTE — Telephone Encounter (Signed)
Spoke to this patient and she does not have an appointment with GI yet, could you all help move this along. Thanks,

## 2015-09-22 NOTE — Telephone Encounter (Signed)
Per our last visit, she has persisting nausea and vomiting, the plan was to get her to see one of our local GI doctors. Referral was placed few days ago. Please check on the status of referral

## 2015-09-22 NOTE — Telephone Encounter (Signed)
Pt called back in to check the status of her referral. Pt is confused, and would like to speak with someone further. Pt says that she has to have test complete with in 10 days of her completing her antibiotics.    CB: [phone]

## 2015-09-22 NOTE — Telephone Encounter (Signed)
Have checked on referral and is in the QUE for [facility] and they will call to schedule appt. Called the patient left message to call back.

## 2015-09-22 NOTE — Telephone Encounter (Signed)
Please advise, testing?

## 2015-09-23 ENCOUNTER — Telehealth: Payer: Self-pay | Admitting: Gastroenterology

## 2015-09-23 NOTE — Telephone Encounter (Signed)
Will try to refer to [facility]

## 2015-09-23 NOTE — Telephone Encounter (Signed)
See note from Dr. Fuller Plan.

## 2015-09-23 NOTE — Telephone Encounter (Signed)
Patient's husband wants patient seen again by Dr. Fuller Plan.  Patient has been seeing MD in [place] that hold her that she has no evidence of cirrhosis or fatty liver and no evidence she had pancreatitis and they don't trust Dr. Lynne Leader medical opinion.  I reviewed the results of the MRCP with the husband from 09/02/14, but they decline to accept the results because MD in [place] said "when he scoped her there was no evidence".  Patient has also been treated by same MD for bacterial overgrowth and they want a hydrogen breath test performed by 10/04/15.  Patient has attempted to transfer care to Dr. Collene Mares and [place] and they both declined to see her.  Husband advised that Dr. Fuller Plan will not see patient as "she has no faith in him".  Husband advised that they should seek care elsewhere.  I also explained that hydrogen breath test is not performed here in [place].  They are provided information to [facility]. They would like [place] to arrange the appt.  I advised him that is not possible and he is provided the number.   Dr. Fuller Plan do you want to proceed with discharge?

## 2015-09-23 NOTE — Telephone Encounter (Signed)
Sharon Armstrong - please have Dr. Fuller Plan review pts records (they are under [place]).  She has been seeing a GI MD in [place] because she did not trust Dr. Lynne Leader opinion on her care.  She had requested to see Dr. Collene Mares or [facility] here to change care.  They both denied patient.  Patient wanted to get into to see Dr. Fuller Plan asap now that no one will see her.  She was very mad that we were making such a big deal about not getting her in ASAP and did not see the big issue with seeking second opinions elsewhere.  Advise

## 2015-09-23 NOTE — Telephone Encounter (Signed)
Patient is being discharged from [facility]. She can contact [facility] or anywhere else she desires for self referral.

## 2015-09-23 NOTE — Telephone Encounter (Signed)
Dr Virgia Land can see patient on 10/26/15 @ [facility] [address]  [place], [place] [address] [phone] Lm on vm awaiting return call

## 2015-09-23 NOTE — Telephone Encounter (Signed)
Dr Virgia Land 10/26/15 @ [facility] [address]  [place], [place] [address] [phone] Lm on vm awaiting return call

## 2015-09-23 NOTE — Telephone Encounter (Signed)
See GI phone note, recommend GI referral to [facility]. Please arrange

## 2015-09-23 NOTE — Telephone Encounter (Signed)
Relation to [id number] Call back number:[phone]   Reason for call:  Patient states GI MD in [place] advised her to have a "hydrogen gas test" to determine if there's a bacteria overgrowth in her small intestine and would like to be referred to [facility] prior to 10/04/15. Please advise

## 2015-09-23 NOTE — Telephone Encounter (Signed)
Contacted Dr Lorie Apley office, she has also declined to see patient. Patient is willing to go back to Dr Fuller Plan. Referral has been sent back to [facility].

## 2015-09-26 ENCOUNTER — Telehealth: Payer: Self-pay | Admitting: Gastroenterology

## 2015-09-26 NOTE — Telephone Encounter (Signed)
Patient dismissed from [facility] by Kennedy Bucker MD , effective Sep 23, 2015. Dismissal letter sent out by certified / registered mail.  DAJ Received signed domestic return receipt verifying delivery of certified letter on Sep 28, 2015. Article number [id number] [place] DAJ

## 2015-10-07 ENCOUNTER — Ambulatory Visit (INDEPENDENT_AMBULATORY_CARE_PROVIDER_SITE_OTHER): Payer: Medicare Other | Admitting: Behavioral Health

## 2015-10-07 DIAGNOSIS — E538 Deficiency of other specified B group vitamins: Secondary | ICD-10-CM | POA: Diagnosis not present

## 2015-10-07 MED ORDER — CYANOCOBALAMIN 1000 MCG/ML IJ SOLN
1000.0000 ug | Freq: Once | INTRAMUSCULAR | Status: AC
  Administered 2015-10-07: 1000 ug via INTRAMUSCULAR

## 2015-10-07 NOTE — Progress Notes (Signed)
Pre visit review using our clinic review tool, if applicable. No additional management support is needed unless otherwise documented below in the visit note.  Patient in clinic today for B12 injection. IM given in Left Deltoid. Patient tolerated injection well. Next appointment has been scheduled for 11/09/15 at 2:00 PM.

## 2015-10-10 NOTE — Telephone Encounter (Signed)
Message sent to [facility] regarding ordering Prolia for Pt.

## 2015-10-10 NOTE — Telephone Encounter (Signed)
Received insurance verification from [employer], Pt's estimated out-of-pocket cost is $0. Insurance verification sent for scanning. Pt has nurse visit for 11/09/2015 to receive Prolia and B12.

## 2015-10-11 DIAGNOSIS — M5136 Other intervertebral disc degeneration, lumbar region: Secondary | ICD-10-CM | POA: Diagnosis not present

## 2015-10-13 NOTE — Telephone Encounter (Signed)
Prolia received.

## 2015-10-20 DIAGNOSIS — K3184 Gastroparesis: Secondary | ICD-10-CM

## 2015-10-20 HISTORY — DX: Gastroparesis: K31.84

## 2015-10-26 DIAGNOSIS — R740 Nonspecific elevation of levels of transaminase and lactic acid dehydrogenase [LDH]: Secondary | ICD-10-CM | POA: Diagnosis not present

## 2015-10-26 DIAGNOSIS — Z888 Allergy status to other drugs, medicaments and biological substances status: Secondary | ICD-10-CM | POA: Diagnosis not present

## 2015-10-26 DIAGNOSIS — K760 Fatty (change of) liver, not elsewhere classified: Secondary | ICD-10-CM | POA: Diagnosis not present

## 2015-10-26 DIAGNOSIS — E119 Type 2 diabetes mellitus without complications: Secondary | ICD-10-CM | POA: Diagnosis not present

## 2015-10-26 DIAGNOSIS — K3184 Gastroparesis: Secondary | ICD-10-CM | POA: Diagnosis not present

## 2015-10-26 DIAGNOSIS — Z9049 Acquired absence of other specified parts of digestive tract: Secondary | ICD-10-CM | POA: Diagnosis not present

## 2015-10-26 DIAGNOSIS — E039 Hypothyroidism, unspecified: Secondary | ICD-10-CM | POA: Diagnosis not present

## 2015-10-26 DIAGNOSIS — K5904 Chronic idiopathic constipation: Secondary | ICD-10-CM | POA: Diagnosis not present

## 2015-10-26 DIAGNOSIS — Z79899 Other long term (current) drug therapy: Secondary | ICD-10-CM | POA: Diagnosis not present

## 2015-10-26 DIAGNOSIS — I10 Essential (primary) hypertension: Secondary | ICD-10-CM | POA: Diagnosis not present

## 2015-10-26 DIAGNOSIS — K6389 Other specified diseases of intestine: Secondary | ICD-10-CM | POA: Diagnosis not present

## 2015-10-26 DIAGNOSIS — Z86010 Personal history of colonic polyps: Secondary | ICD-10-CM | POA: Diagnosis not present

## 2015-10-26 DIAGNOSIS — Z794 Long term (current) use of insulin: Secondary | ICD-10-CM | POA: Diagnosis not present

## 2015-11-09 ENCOUNTER — Ambulatory Visit (INDEPENDENT_AMBULATORY_CARE_PROVIDER_SITE_OTHER): Payer: Medicare Other | Admitting: Behavioral Health

## 2015-11-09 DIAGNOSIS — E538 Deficiency of other specified B group vitamins: Secondary | ICD-10-CM

## 2015-11-09 DIAGNOSIS — M810 Age-related osteoporosis without current pathological fracture: Secondary | ICD-10-CM

## 2015-11-09 MED ORDER — DENOSUMAB 60 MG/ML ~~LOC~~ SOLN
60.0000 mg | Freq: Once | SUBCUTANEOUS | Status: AC
  Administered 2015-11-09: 60 mg via SUBCUTANEOUS

## 2015-11-09 MED ORDER — CYANOCOBALAMIN 1000 MCG/ML IJ SOLN
1000.0000 ug | Freq: Once | INTRAMUSCULAR | Status: AC
  Administered 2015-11-09: 1000 ug via INTRAMUSCULAR

## 2015-11-09 NOTE — Progress Notes (Signed)
Pre visit review using our clinic review tool, if applicable. No additional management support is needed unless otherwise documented below in the visit note.  Patient in clinic today for B12 & Prolia injections. IM given in Left Deltoid & SQ in the Right Arm. Patient tolerated both injections well. No signs or symptoms of a reaction prior to leaving the visit.   Next appointment scheduled for 12/09/15 at 2:00 PM.

## 2015-11-10 DIAGNOSIS — K3184 Gastroparesis: Secondary | ICD-10-CM | POA: Diagnosis not present

## 2015-11-10 DIAGNOSIS — K3189 Other diseases of stomach and duodenum: Secondary | ICD-10-CM | POA: Diagnosis not present

## 2015-11-21 ENCOUNTER — Telehealth: Payer: Self-pay | Admitting: Internal Medicine

## 2015-11-30 DIAGNOSIS — A049 Bacterial intestinal infection, unspecified: Secondary | ICD-10-CM | POA: Diagnosis not present

## 2015-12-06 NOTE — Telephone Encounter (Signed)
Completed.

## 2015-12-07 ENCOUNTER — Ambulatory Visit: Payer: Medicare Other | Admitting: Internal Medicine

## 2015-12-09 ENCOUNTER — Ambulatory Visit (INDEPENDENT_AMBULATORY_CARE_PROVIDER_SITE_OTHER): Payer: Medicare Other | Admitting: Behavioral Health

## 2015-12-09 DIAGNOSIS — E538 Deficiency of other specified B group vitamins: Secondary | ICD-10-CM

## 2015-12-09 MED ORDER — CYANOCOBALAMIN 1000 MCG/ML IJ SOLN
1000.0000 ug | Freq: Once | INTRAMUSCULAR | Status: AC
  Administered 2015-12-09: 1000 ug via INTRAMUSCULAR

## 2015-12-09 NOTE — Progress Notes (Signed)
Pre visit review using our clinic review tool, if applicable. No additional management support is needed unless otherwise documented below in the visit note.  Patient presents in clinic today for B12 injection. IM given in Left Deltoid. Patient tolerated injection well.  Next appointment scheduled for 01/09/16 at 2:15 PM.

## 2015-12-17 ENCOUNTER — Other Ambulatory Visit: Payer: Self-pay | Admitting: Internal Medicine

## 2015-12-25 ENCOUNTER — Other Ambulatory Visit: Payer: Self-pay | Admitting: Internal Medicine

## 2015-12-27 ENCOUNTER — Other Ambulatory Visit: Payer: Self-pay | Admitting: Internal Medicine

## 2015-12-27 NOTE — Telephone Encounter (Signed)
Pt is requesting refill on Clonazepam.  Last OV: 09/07/2015  Last Fill: 08/09/2015 #60 and 3RF UDS: 03/08/2015 Low risk  Please advise.

## 2015-12-27 NOTE — Telephone Encounter (Signed)
Rx faxed to [facility].  

## 2015-12-27 NOTE — Telephone Encounter (Signed)
Rx printed, awaiting MD signature.  

## 2015-12-27 NOTE — Telephone Encounter (Signed)
Ok 60 and 1  

## 2015-12-28 ENCOUNTER — Other Ambulatory Visit: Payer: Self-pay | Admitting: Internal Medicine

## 2016-01-02 ENCOUNTER — Other Ambulatory Visit: Payer: Self-pay | Admitting: Internal Medicine

## 2016-01-09 ENCOUNTER — Ambulatory Visit (INDEPENDENT_AMBULATORY_CARE_PROVIDER_SITE_OTHER): Payer: Medicare Other | Admitting: Internal Medicine

## 2016-01-09 ENCOUNTER — Encounter: Payer: Self-pay | Admitting: Internal Medicine

## 2016-01-09 ENCOUNTER — Ambulatory Visit: Payer: Medicare Other | Admitting: Internal Medicine

## 2016-01-09 VITALS — BP 122/70 | HR 78 | Temp 97.8°F | Resp 12 | Ht 64.0 in | Wt 154.1 lb

## 2016-01-09 DIAGNOSIS — I10 Essential (primary) hypertension: Secondary | ICD-10-CM

## 2016-01-09 DIAGNOSIS — M810 Age-related osteoporosis without current pathological fracture: Secondary | ICD-10-CM | POA: Diagnosis not present

## 2016-01-09 DIAGNOSIS — E538 Deficiency of other specified B group vitamins: Secondary | ICD-10-CM | POA: Diagnosis not present

## 2016-01-09 DIAGNOSIS — E785 Hyperlipidemia, unspecified: Secondary | ICD-10-CM | POA: Diagnosis not present

## 2016-01-09 DIAGNOSIS — E118 Type 2 diabetes mellitus with unspecified complications: Secondary | ICD-10-CM

## 2016-01-09 MED ORDER — CYANOCOBALAMIN 1000 MCG/ML IJ SOLN
1000.0000 ug | Freq: Once | INTRAMUSCULAR | Status: AC
  Administered 2016-01-09: 1000 ug via INTRAMUSCULAR

## 2016-01-09 MED ORDER — CLONAZEPAM 0.5 MG PO TABS: 0.5000 mg | ORAL_TABLET | Freq: Three times a day (TID) | ORAL | 1 refills | Status: DC | PRN

## 2016-01-09 NOTE — Progress Notes (Signed)
Pre visit review using our clinic review tool, if applicable. No additional management support is needed unless otherwise documented below in the visit note. 

## 2016-01-09 NOTE — Progress Notes (Signed)
 Subjective:    Patient ID: Sharon Armstrong, female    DOB: 08/31/1940, 74 y.o.   MRN: [id number]  DOS:  01/09/2016 Type of visit - description : Routine office visit Interval history: Persistent nausea, vomiting: Was evaluated by [facility], prescribe antibiotics, finished 10 days ago, felt better, symptoms are resurfacing. Anxiety: Has a lot of issues going on family wise, currently taking Klonopin twice a day, increase to 3 times a day? Hypothyroidism: Good medication compliance.   Review of Systems   Past Medical History:  Diagnosis Date  . Adenomatous polyps 11/1998  . Anxiety   . Aortic valve insufficiency    mild  . Breast cancer (HCC) 04/2007   s/p XRT (last 4/09)  . Cervical myelopathy (HCC)   . Cervical stenosis of spine    sees neuro surgery and dr.ramons s/p local injection 4/09  . Chronic constipation   . Chronic pancreatitis (HCC) 09/29/2014  . Colon polyps 02/2010   Tubular  Adenomas                                     . Diabetes mellitus   . Difficulty in urination    pt uses cath at times  . Diverticulosis of colon   . Elevated liver function tests    fatty liver per CT 2009  . Fatty liver   . Full body hives    if patient does not take Fexofenadine  . Gastroparesis 10/2015   gastric emptying abnormal for 4 hours  . GERD (gastroesophageal reflux disease)   . Hemorrhoids   . Hyperlipemia   . Hypertension   . Hypothyroidism   . Insomnia   . Lumbar spondylosis    Lumbar epidural steroid injection L5-S1 to the left, Dr. Ramos  . Mitral valve regurgitation   . Osteoarthritis   . 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 Restless leg syndrome   . Urticaria 06/2009    Past Surgical History:  Procedure Laterality Date  . ANTERIOR CERVICAL DECOMP/DISCECTOMY FUSION N/A 01/08/2013   Procedure: Cervical three-four, four-five, five-six anterior cervical decompression with fusion plating and bonegraft;  Surgeon: David S Jones, MD;  Location: [facility];  Service: Neurosurgery;  Laterality: N/A;  Cervical three-four, four-five, five-six anterior cervical decompression with fusion plating and bonegraft  . APPENDECTOMY    . BREAST LUMPECTOMY Left 2009   left breast  . CARDIAC CATHETERIZATION  2001  . CATARACT EXTRACTION, BILATERAL  2/11  . EYE SURGERY    . GANGLION CYST EXCISION Right    wrist  . sinus cyst removed    . UTERINE FIBROID SURGERY      Social History   Social History  . Marital status: Married    Spouse name: N/A  . Number of children: 2  . Years of education: N/A   Occupational History  . custom decorator    Social History Main Topics  . Smoking status: Never Smoker  . Smokeless tobacco: Never Used  . Alcohol use 0.0 oz/week     Comment: rare  . Drug use: No  . Sexual activity: Not on file   Other Topics Concern  . Not on file   Social History Narrative   Lives in [place] half of the time            Medication List       Accurate as   of 01/09/16  5:32 PM. Always use your most recent med list.          ACCU-CHEK COMPACT PLUS test strip Generic drug:  glucose blood CHECK BLOOD SUGARS NO MORE THAN TWICE DAILY DX E11.9   ASPERCREME LIDOCAINE 4 % Ptch Generic drug:  Lidocaine Apply topically.   carbidopa-levodopa 25-250 MG disintegrating tablet Commonly known as:  PARCOPA Take 1-2 tablets by mouth at bedtime.   clonazePAM 0.5 MG tablet Commonly known as:  KLONOPIN Take 1 tablet (0.5 mg total) by mouth 3 (three) times daily as needed for anxiety.   estradiol 0.1 MG/GM vaginal cream Commonly known as:  ESTRACE Place 1 Applicatorful vaginally as needed. Reported on 09/07/2015   Insulin Glargine 100 UNIT/ML Solostar Pen Commonly known as:  LANTUS SOLOSTAR Inject 32-38 Units into the skin daily.   levothyroxine 100 MCG tablet Commonly known as:  SYNTHROID, LEVOTHROID Take 1 tablet (100 mcg total) by mouth daily.   lisinopril 10 MG tablet Commonly known as:  PRINIVIL,ZESTRIL Take 0.5  tablets (5 mg total) by mouth daily.   meloxicam 7.5 MG tablet Commonly known as:  MOBIC Take 7.5 mg by mouth daily as needed for pain.   metFORMIN 500 MG tablet Commonly known as:  GLUCOPHAGE Take 1,000 mg by mouth 2 (two) times daily with a meal.   milk thistle 175 MG tablet Take 175 mg by mouth daily.   omeprazole 40 MG capsule Commonly known as:  PRILOSEC Take 1 capsule (40 mg total) by mouth 2 (two) times daily.   ondansetron 4 MG tablet Commonly known as:  ZOFRAN TAKE 1 TABLET (4 MG TOTAL) BY MOUTH EVERY 8 (EIGHT) HOURS AS NEEDED FOR NAUSEA OR VOMITING.   ONETOUCH DELICA LANCETS Misc Check once daily.   traMADol 50 MG tablet Commonly known as:  ULTRAM Take 2 tablets by mouth 2 (two) times daily.   triamterene-hydrochlorothiazide 75-50 MG tablet Commonly known as:  MAXZIDE Take 0.5 tablets by mouth daily.          Objective:   Physical Exam BP 122/70 (BP Location: Left Arm, Patient Position: Sitting, Cuff Size: Small)   Pulse 78   Temp 97.8 F (36.6 C) (Oral)   Resp 12   Ht 5' 4" (1.626 m)   Wt 154 lb 2 oz (69.9 kg)   SpO2 97%   BMI 26.46 kg/m  General:   Well developed, well nourished . NAD.  HEENT:  Normocephalic . Face symmetric, atraumatic Lungs:  CTA B Normal respiratory effort, no intercostal retractions, no accessory muscle use. Heart: RRR,  soft mild systolic murmur.II/IV no pretibial edema bilaterally  Abdomen:  Not distended, soft, non-tender. No rebound or rigidity.  Skin: Not pale. Not jaundice Neurologic:  alert & oriented X3.  Speech normal, gait appropriate for age and unassisted Psych--  Cognition and judgment appear intact.  Cooperative with normal attention span and concentration.  Behavior appropriate. No anxious or depressed appearing.    Assessment & Plan:     Assessment> DM -- used to see Dr Kumar HTN Hyperlipidemia Hypothyroidism Anxiety , insomnia Murmur: Echo 04/12/2014 in [place] trace MR Chronic fatigue  (rx empiric B12 shot 12-2014) RLS -  MSK: on ultram per pain mngmt --Spinal cervical stenosis, local injections, surgery 12-2012 --Low back pain-- Dr Ramos --DJD  --osteoporosis, last dexa 01-2014, last Prolia 10-2015 GI: used to see Dr Stark, last OV 10-2014 , now Dr Fina  --GERD, --Chronic pancreatitis, h/o episodes of acute pancreatitis --h/o fatty liver and ?   Of early cirrhosis by MRI --Chronic constipation --Nausea, vomiting, eval in [place]~ 12- 2016: Delayed gastric emptying, EGD biopsy no H. Pylori,SIBO +, Rx ABX Breast cancer dx 2008 XRT H/o +PPD H/o Urticaria H/ o Mild aortic valve insufficiency  PLAN: DM: Needs to see Dr. Kumar, referral. Continue metformin and insulin. Not on aspirin due to nsaids allergies HTN: Seems control, continue Maxzide and Zestril. Check BMP, CBC Hyperlipidemia: Controlled, check LFTs Hypothyroidism: Good med compliance, TSH within normal. Anxiety insomnia: Needs better control, will avoid Lexapro or other SSRIs because she is taking tramadol, increase clonazepam to 3 times a day, watch for excessive somnolence. GI issues: She met with her new GI doctor Dr. Fina at [facility]. They recommended to repeat state gastric emptying study, also they suspected bacterial overgrowth and HBT test was recommended (unable to find results in care everywhere). Patient reports that she just finished another round of antibiotics. Osteoporosis On prolia, check a bone density test and vitamin D RTC 4 months    

## 2016-01-09 NOTE — Patient Instructions (Addendum)
GO TO THE LAB : Get the blood work     GO TO THE FRONT DESK Schedule your next appointment for a  a routine checkup in 4  months.  Okay to take clonazepam 3 times a day as needed, watch for excessive somnolence

## 2016-01-09 NOTE — Assessment & Plan Note (Signed)
DM: Needs to see Dr. Dwyane Dee, referral. Continue metformin and insulin. Not on aspirin due to nsaids allergies HTN: Seems control, continue Maxzide and Zestril. Check BMP, CBC Hyperlipidemia: Controlled, check LFTs Hypothyroidism: Good med compliance, TSH within normal. Anxiety insomnia: Needs better control, will avoid Lexapro or other SSRIs because she is taking tramadol, increase clonazepam to 3 times a day, watch for excessive somnolence. GI issues: She met with her new GI doctor Dr. Roney Mans at [facility]. They recommended to repeat state gastric emptying study, also they suspected bacterial overgrowth and HBT test was recommended (unable to find results in care everywhere). Patient reports that she just finished another round of antibiotics. Osteoporosis On prolia, check a bone density test and vitamin D RTC 4 months

## 2016-01-24 ENCOUNTER — Encounter: Payer: Self-pay | Admitting: Endocrinology

## 2016-01-24 ENCOUNTER — Other Ambulatory Visit (INDEPENDENT_AMBULATORY_CARE_PROVIDER_SITE_OTHER): Payer: Medicare Other

## 2016-01-24 ENCOUNTER — Telehealth: Payer: Self-pay | Admitting: *Deleted

## 2016-01-24 DIAGNOSIS — I10 Essential (primary) hypertension: Secondary | ICD-10-CM

## 2016-01-24 DIAGNOSIS — M810 Age-related osteoporosis without current pathological fracture: Secondary | ICD-10-CM | POA: Diagnosis not present

## 2016-01-24 DIAGNOSIS — K30 Functional dyspepsia: Secondary | ICD-10-CM | POA: Diagnosis not present

## 2016-01-24 DIAGNOSIS — R109 Unspecified abdominal pain: Secondary | ICD-10-CM

## 2016-01-24 LAB — CBC WITH DIFFERENTIAL/PLATELET
BASOS PCT: 0.2 % (ref 0.0–3.0)
Basophils Absolute: 0 10*3/uL (ref 0.0–0.1)
EOS PCT: 0 % (ref 0.0–5.0)
Eosinophils Absolute: 0 10*3/uL (ref 0.0–0.7)
HEMATOCRIT: 36.3 % (ref 36.0–46.0)
HEMOGLOBIN: 12.2 g/dL (ref 12.0–15.0)
LYMPHS PCT: 50.2 % — AB (ref 12.0–46.0)
Lymphs Abs: 2.7 10*3/uL (ref 0.7–4.0)
MCHC: 33.7 g/dL (ref 30.0–36.0)
MCV: 93.1 fl (ref 78.0–100.0)
Monocytes Absolute: 0.4 10*3/uL (ref 0.1–1.0)
Monocytes Relative: 7.1 % (ref 3.0–12.0)
Neutro Abs: 2.3 10*3/uL (ref 1.4–7.7)
Neutrophils Relative %: 42.5 % — ABNORMAL LOW (ref 43.0–77.0)
Platelets: 164 10*3/uL (ref 150.0–400.0)
RBC: 3.9 Mil/uL (ref 3.87–5.11)
RDW: 14.2 % (ref 11.5–15.5)
WBC: 5.4 10*3/uL (ref 4.0–10.5)

## 2016-01-24 LAB — BASIC METABOLIC PANEL
BUN: 23 mg/dL (ref 6–23)
CHLORIDE: 102 meq/L (ref 96–112)
CO2: 28 meq/L (ref 19–32)
Calcium: 9.6 mg/dL (ref 8.4–10.5)
Creatinine, Ser: 0.94 mg/dL (ref 0.40–1.20)
GFR: 61.74 mL/min (ref >60.00)
GLUCOSE: 105 mg/dL — AB (ref 70–99)
POTASSIUM: 4 meq/L (ref 3.5–5.1)
SODIUM: 139 meq/L (ref 135–145)

## 2016-01-24 LAB — AST: AST: 36 U/L (ref 0–37)

## 2016-01-24 LAB — ALT: ALT: 40 U/L — ABNORMAL HIGH (ref 0–35)

## 2016-01-25 LAB — AFP TUMOR MARKER: AFP TUMOR MARKER: 2.5 ng/mL (ref <6.1)

## 2016-01-27 LAB — VITAMIN D 1,25 DIHYDROXY
VITAMIN D 1, 25 (OH) TOTAL: 31 pg/mL (ref 18–72)
VITAMIN D3 1, 25 (OH): 31 pg/mL
Vitamin D2 1, 25 (OH)2: <8 pg/mL

## 2016-01-30 DIAGNOSIS — K5909 Other constipation: Secondary | ICD-10-CM | POA: Diagnosis not present

## 2016-01-30 DIAGNOSIS — K760 Fatty (change of) liver, not elsewhere classified: Secondary | ICD-10-CM | POA: Diagnosis not present

## 2016-01-30 DIAGNOSIS — K6389 Other specified diseases of intestine: Secondary | ICD-10-CM | POA: Diagnosis not present

## 2016-01-30 DIAGNOSIS — K3184 Gastroparesis: Secondary | ICD-10-CM | POA: Diagnosis not present

## 2016-01-30 DIAGNOSIS — K5904 Chronic idiopathic constipation: Secondary | ICD-10-CM | POA: Diagnosis not present

## 2016-01-30 DIAGNOSIS — E119 Type 2 diabetes mellitus without complications: Secondary | ICD-10-CM | POA: Diagnosis not present

## 2016-01-30 DIAGNOSIS — Z794 Long term (current) use of insulin: Secondary | ICD-10-CM | POA: Diagnosis not present

## 2016-01-30 DIAGNOSIS — D696 Thrombocytopenia, unspecified: Secondary | ICD-10-CM | POA: Diagnosis not present

## 2016-02-01 ENCOUNTER — Other Ambulatory Visit: Payer: Medicare Other

## 2016-02-03 ENCOUNTER — Ambulatory Visit (INDEPENDENT_AMBULATORY_CARE_PROVIDER_SITE_OTHER): Payer: Medicare Other | Admitting: Endocrinology

## 2016-02-03 ENCOUNTER — Encounter: Payer: Self-pay | Admitting: Endocrinology

## 2016-02-03 VITALS — BP 126/74 | HR 82 | Temp 98.1°F | Resp 14 | Ht 64.0 in | Wt 154.2 lb

## 2016-02-03 DIAGNOSIS — E039 Hypothyroidism, unspecified: Secondary | ICD-10-CM

## 2016-02-03 DIAGNOSIS — E119 Type 2 diabetes mellitus without complications: Secondary | ICD-10-CM

## 2016-02-03 DIAGNOSIS — Z23 Encounter for immunization: Secondary | ICD-10-CM

## 2016-02-03 DIAGNOSIS — E782 Mixed hyperlipidemia: Secondary | ICD-10-CM

## 2016-02-03 DIAGNOSIS — R110 Nausea: Secondary | ICD-10-CM

## 2016-02-03 LAB — POCT GLYCOSYLATED HEMOGLOBIN (HGB A1C): HEMOGLOBIN A1C: 5.9

## 2016-02-03 MED ORDER — METFORMIN HCL ER 500 MG PO TB24: 2000.0000 mg | ORAL_TABLET | Freq: Every day | ORAL | 3 refills | Status: DC

## 2016-02-03 NOTE — Progress Notes (Signed)
Patient ID: Sharon Armstrong, female   DOB: 1941/04/14, 75 y.o.   MRN: [id number]           Reason for Appointment: f/u  for Type 2 Diabetes  Referring physician: Larose Kells  History of Present Illness:       Background history: She had been treated for several years with metformin and Amaryl  Her control had been fairly good until about 2012 and she thinks that because of poor control with oral agents she was given Lantus insulin in addition She has been on Lantus and metformin since then with variable control  Recent history:   INSULIN regimen is described as: 32 units of Lantus in am   Oral hypoglycemic drugs the patient is taking are: Metformin 1g bid   She is following up after her last visit in 10/16 She lives 6 months in [place] and has a physician there also  Her A1c is excellent at 6.8 considering her age and duration of diabetes Taking about the same amount of insulin as last year She is having some variability in her blood sugars but is checking mostly fasting readings again She is somewhat irregular with following her diet Currently not exercising much for various reasons       Side effects from medications have been: None  Compliance with the medical regimen: Good  Hypoglycemia:   none   Glucose monitoring:  done 1-2  times a day         Glucometer:  Accucheck  Blood Glucose readings by download   Mean values apply above for all meters except median for One Touch  PRE-MEAL Fasting Lunch Dinner Bedtime Overall  Glucose range: 89-182 104-148 135 106-198   Mean/median: 116    123     Self-care:                   Dinner at 6-7 pm Diet consultation was in 6/16               Exercise: walking 3/7 for 10-20 min  Weight history:    Wt Readings from Last 3 Encounters:  02/03/16 154 lb 3.2 oz (69.9 kg)  01/09/16 154 lb 2 oz (69.9 kg)  09/07/15 158 lb 4 oz (71.8 kg)    Glycemic control:   Lab Results  Component Value Date   HGBA1C 5.9 02/03/2016   HGBA1C  6.8 (H) 02/17/2015   HGBA1C 6.3 10/26/2014   Lab Results  Component Value Date   MICROALBUR 1.8 10/26/2014   LDLCALC 105 (H) 09/07/2015   CREATININE 0.94 01/24/2016         Medication List       Accurate as of 02/03/16 11:59 PM. Always use your most recent med list.          ACCU-CHEK COMPACT PLUS test strip Generic drug:  glucose blood CHECK BLOOD SUGARS NO MORE THAN TWICE DAILY DX E11.9   ASPERCREME LIDOCAINE 4 % Ptch Generic drug:  Lidocaine Apply topically.   carbidopa-levodopa 25-250 MG disintegrating tablet Commonly known as:  PARCOPA Take 1-2 tablets by mouth at bedtime.   clonazePAM 0.5 MG tablet Commonly known as:  KLONOPIN Take 1 tablet (0.5 mg total) by mouth 3 (three) times daily as needed for anxiety.   Cyanocobalamin 1000 MCG/ML Kit Inject as directed.   estradiol 0.1 MG/GM vaginal cream Commonly known as:  ESTRACE Place 1 Applicatorful vaginally as needed. Reported on 09/07/2015   Insulin Glargine 100 UNIT/ML Solostar Pen Commonly known as:  LANTUS SOLOSTAR Inject 32-38 Units into the skin daily.   levothyroxine 100 MCG tablet Commonly known as:  SYNTHROID, LEVOTHROID Take 1 tablet (100 mcg total) by mouth daily.   lisinopril 10 MG tablet Commonly known as:  PRINIVIL,ZESTRIL Take 0.5 tablets (5 mg total) by mouth daily.   meloxicam 7.5 MG tablet Commonly known as:  MOBIC Take 7.5 mg by mouth daily as needed for pain.   metFORMIN 500 MG 24 hr tablet Commonly known as:  GLUCOPHAGE-XR Take 4 tablets (2,000 mg total) by mouth daily with supper.   milk thistle 175 MG tablet Take 175 mg by mouth daily.   omeprazole 40 MG capsule Commonly known as:  PRILOSEC Take 1 capsule (40 mg total) by mouth 2 (two) times daily.   ondansetron 4 MG tablet Commonly known as:  ZOFRAN TAKE 1 TABLET (4 MG TOTAL) BY MOUTH EVERY 8 (EIGHT) HOURS AS NEEDED FOR NAUSEA OR VOMITING.   ONETOUCH DELICA LANCETS Misc Check once daily.   traMADol 50 MG  tablet Commonly known as:  ULTRAM Take 2 tablets by mouth 2 (two) times daily.   triamterene-hydrochlorothiazide 75-50 MG tablet Commonly known as:  MAXZIDE Take 0.5 tablets by mouth daily.       Allergies:  Allergies  Allergen Reactions  . Gabapentin Swelling    Edema, legs   . Iodine Hives  . Iohexol   . Nsaids Other (See Comments)    REACTION: hallucinations  . Tolmetin Other (See Comments)    REACTION: hallucinations  . Aciphex [Rabeprazole] Rash  . Ioxaglate Rash and Hives  . Ivp Dye [Iodinated Diagnostic Agents] Hives and Rash  . Nexium [Esomeprazole Magnesium] Rash  . Pepcid [Famotidine] Rash  . Protonix [Pantoprazole Sodium] Rash    Past Medical History:  Diagnosis Date  . Adenomatous polyps 11/1998  . Anxiety   . Aortic valve insufficiency    mild  . Breast cancer (HCC) 04/2007   s/p XRT (last 4/09)  . Cervical myelopathy (HCC)   . Cervical stenosis of spine    sees neuro surgery and dr.ramons s/p local injection 4/09  . Chronic constipation   . Chronic pancreatitis (HCC) 09/29/2014  . Colon polyps 02/2010   Tubular  Adenomas                                     . Diabetes mellitus   . Difficulty in urination    pt uses cath at times  . Diverticulosis of colon   . Elevated liver function tests    fatty liver per CT 2009  . Fatty liver   . Full body hives    if patient does not take Fexofenadine  . Gastroparesis 10/2015   gastric emptying abnormal for 4 hours  . GERD (gastroesophageal reflux disease)   . Hemorrhoids   . Hyperlipemia   . Hypertension   . Hypothyroidism   . Insomnia   . Lumbar spondylosis    Lumbar epidural steroid injection L5-S1 to the left, Dr. Ethelene Hal  . Mitral valve regurgitation   . Osteoarthritis   . 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 Restless leg syndrome   . Urticaria 06/2009    Past Surgical History:  Procedure Laterality Date  . ANTERIOR CERVICAL DECOMP/DISCECTOMY FUSION N/A 01/08/2013   Procedure:  Cervical three-four, four-five, five-six anterior cervical decompression with fusion plating and bonegraft;  Surgeon: Tia Alert,  MD;  Location: [facility];  Service: Neurosurgery;  Laterality: N/A;  Cervical three-four, four-five, five-six anterior cervical decompression with fusion plating and bonegraft  . APPENDECTOMY    . BREAST LUMPECTOMY Left 2009   left breast  . CARDIAC CATHETERIZATION  2001  . CATARACT EXTRACTION, BILATERAL  2/11  . EYE SURGERY    . GANGLION CYST EXCISION Right    wrist  . sinus cyst removed    . UTERINE FIBROID SURGERY      Family History  Problem Relation Age of Onset  . Coronary artery disease Neg Hx   . Hypertension Mother   . Diabetes Neg Hx   . Stroke Neg Hx   . Colon cancer Neg Hx   . Breast cancer Neg Hx   . Lung cancer Mother     mets to brain  . Tuberculosis Father     died at 2  . Brain cancer Mother     Social History:  reports that she has never smoked. She has never used smokeless tobacco. She reports that she drinks alcohol. She reports that she does not use drugs.    Review of Systems    Lipid history: Her levels are showing increased LDL, patient has not been on any lipid-lowering drugs possibly because of liver dysfunction    Lab Results  Component Value Date   CHOL 171 09/07/2015   HDL 44.70 09/07/2015   LDLCALC 105 (H) 09/07/2015   TRIG 108.0 09/07/2015   CHOLHDL 4 09/07/2015           Eyes:  Most recent eye exam was in 4/17 and has no known retinopathy  Hypertension: She is on lisinopril and Maxzide with good control  Gastrointestinal: History of fatty liver/?  Cirrhosis She was also told to have small bowel bacterial overgrowth in [place] Also may have possible gastroparesis with some nausea  Lab Results  Component Value Date   ALT 40 (H) 01/24/2016     Endocrine: No unusual fatigue. Long history of thyroid disease: Was told in her teen years that she was hypothyroid and this may have been a sequela to  possible radiation treatment in childhood in [place]  TSH stable As of 4/17   Lab Results  Component Value Date   TSH 3.58 09/07/2015   TSH 3.23 02/17/2015   TSH 3.70 10/26/2014   FREET4 0.81 02/17/2015    Foot exam in 9/17 showed:  Decreased monofilament sensation on the toes and distal plantar surfaces of the right side  Physical Examination:  BP 126/74   Pulse 82   Temp 98.1 F (36.7 C)   Resp 14   Ht '5\' 4"'$  (1.626 m)   Wt 154 lb 3.2 oz (69.9 kg)   SpO2 95%   BMI 26.47 kg/m    Diabetic Foot Exam - Simple   Simple Foot Form Diabetic Foot exam was performed with the following findings:  Yes   Visual Inspection No deformities, no ulcerations, no other skin breakdown bilaterally:  Yes Sensation Testing See comments:  Yes Pulse Check Posterior Tibialis and Dorsalis pulse intact bilaterally:  Yes Comments Decreased monofilament sensation mostly on the distal plantar surfaces especially on the right, minimally decreased on the distal toes        ASSESSMENT/PLAN:   Diabetes type 2, uncontrolled    She has Better blood sugar control since her last visit a year ago See history of present illness for detailed discussion of current diabetes management, blood sugar patterns and problems  identified  Discussed again details of current management, adjustment of basal insulin based on fasting readings, need for postprandial monitoring, regular exercise, moderation of carbohydrates and high fat meals or snacks Since her fasting readings are mostly near normal she can continue the same dose of Lantus  Foot care principles discussed since she has mild decrease in sensation in her distal feet  Also because of her reportedly having some gastroparesis and some nausea she can try extended-release metformin and cervical regular metformin and discussed the differences between this and regular metformin  She needs to have her thyroid rechecked when she goes to [place] next month along  with lipids   Counseling time on subjects discussed above is over 50% of today's 25 minute visit   There are no Patient Instructions on file for this visit.  Gabbie Marzo 02/06/2016, 8:19 AM   Note: This office note was prepared with Dragon voice recognition system technology. Any transcriptional errors that result from this process are unintentional. `

## 2016-02-08 ENCOUNTER — Ambulatory Visit
Admission: RE | Admit: 2016-02-08 | Discharge: 2016-02-08 | Disposition: A | Discharge status: Home / Self Care | Payer: Medicare Other | Source: Ambulatory Visit | Attending: Internal Medicine | Admitting: Internal Medicine

## 2016-02-08 DIAGNOSIS — M8589 Other specified disorders of bone density and structure, multiple sites: Secondary | ICD-10-CM | POA: Diagnosis not present

## 2016-02-08 DIAGNOSIS — Z780 Asymptomatic menopausal state: Secondary | ICD-10-CM | POA: Diagnosis not present

## 2016-02-13 DIAGNOSIS — K5731 Diverticulosis of large intestine without perforation or abscess with bleeding: Secondary | ICD-10-CM | POA: Diagnosis not present

## 2016-02-13 DIAGNOSIS — K921 Melena: Secondary | ICD-10-CM | POA: Diagnosis not present

## 2016-02-14 ENCOUNTER — Telehealth: Payer: Self-pay | Admitting: Internal Medicine

## 2016-02-14 NOTE — Telephone Encounter (Signed)
Labs received from [facility], results forwarded to PCP in red folder. Pt also requesting results on bone density. Please advise.

## 2016-02-14 NOTE — Telephone Encounter (Signed)
Relation to [id number] Call back number:[phone]  Reason for call:   Patient would like PCP to review lab results from Dr. Jeannette How [facility]  Gastroenter[facility], [place], [place] to discuss. Medical Records will fax lab report today to [phone] Attention Dr. Larose Kells   Patient would like to discuss bone density result.

## 2016-02-15 NOTE — Telephone Encounter (Signed)
Pt would also like to know status of bone density results from 02/08/2016. Please advise.

## 2016-02-15 NOTE — Telephone Encounter (Signed)
CBC showed  a stable hemoglobin, platelets slightly low at 145 but no far from baseline. Good results

## 2016-02-16 NOTE — Telephone Encounter (Signed)
Spoke w/ Pt's husband, Francee Piccolo, (Pt unavailable), informed him of Dr. Larose Kells interpretations and recommendations. Roger verbalized understanding. Informed him to have Pt call if questions.

## 2016-02-16 NOTE — Telephone Encounter (Signed)
DEXA 2010: T score -1.5 (femur) DEXA 2012: Results from available, rx prolia DEXA 2015: T score -1.9 (femur) DEXA 02/08/2016: T score -1.6 (femur), forearm T score -2.3 Started Prolia approximately 05-2011 Advise patient: Sharon Armstrong is working, continue with it.

## 2016-02-28 ENCOUNTER — Other Ambulatory Visit: Payer: Self-pay | Admitting: Internal Medicine

## 2016-03-02 ENCOUNTER — Ambulatory Visit (INDEPENDENT_AMBULATORY_CARE_PROVIDER_SITE_OTHER): Payer: Medicare Other | Admitting: Behavioral Health

## 2016-03-02 DIAGNOSIS — E538 Deficiency of other specified B group vitamins: Secondary | ICD-10-CM | POA: Diagnosis not present

## 2016-03-02 MED ORDER — CYANOCOBALAMIN 1000 MCG/ML IJ SOLN
1000.0000 ug | Freq: Once | INTRAMUSCULAR | Status: AC
  Administered 2016-03-02: 1000 ug via INTRAMUSCULAR

## 2016-03-02 NOTE — Progress Notes (Signed)
Pre visit review using our clinic review tool, if applicable. No additional management support is needed unless otherwise documented below in the visit note.  Patient in clinic today for B12 injection. IM given in Left Deltoid. Patient tolerated injection well.

## 2016-03-07 DIAGNOSIS — M5136 Other intervertebral disc degeneration, lumbar region: Secondary | ICD-10-CM | POA: Diagnosis not present

## 2016-03-12 DIAGNOSIS — K760 Fatty (change of) liver, not elsewhere classified: Secondary | ICD-10-CM | POA: Diagnosis not present

## 2016-03-12 DIAGNOSIS — K740 Hepatic fibrosis: Secondary | ICD-10-CM | POA: Diagnosis not present

## 2016-03-12 DIAGNOSIS — K7581 Nonalcoholic steatohepatitis (NASH): Secondary | ICD-10-CM | POA: Diagnosis not present

## 2016-03-16 ENCOUNTER — Telehealth: Payer: Self-pay | Admitting: Internal Medicine

## 2016-03-16 MED ORDER — ONDANSETRON HCL 4 MG PO TABS: ORAL_TABLET | ORAL | 2 refills | Status: DC

## 2016-03-16 NOTE — Telephone Encounter (Signed)
Rx sent 

## 2016-03-16 NOTE — Telephone Encounter (Signed)
Caller name: Tenee Relationship to patient: self Can be reached: [phone] [facility]acy: [id number] - [place], [place] - [address]  Reason for call: Pt called requesting refill on ondansetron. She no longer sees Dr. Fuller Plan. [facility] [place] as she is currently traveling.

## 2016-04-02 DIAGNOSIS — E538 Deficiency of other specified B group vitamins: Secondary | ICD-10-CM | POA: Diagnosis not present

## 2016-04-02 IMAGING — CT CT HEAD W/O CM
2 of 5 series · 13 of 47 positions shown, 16 images · non-contrast
Comparison: 11/04/2007 head CT

CLINICAL DATA: Pain after head trauma.

EXAM:
CT HEAD WITHOUT CONTRAST
CT CERVICAL SPINE WITHOUT CONTRAST
TECHNIQUE: Multidetector CT imaging of the head and cervical spine was
performed following the standard protocol without intravenous
contrast. Multiplanar CT image reconstructions of the cervical spine
were also generated.

[Series 7: coronals · coronal · 0.24mm/px · 3 of 52 slices shown]
[im 18/52  brain]
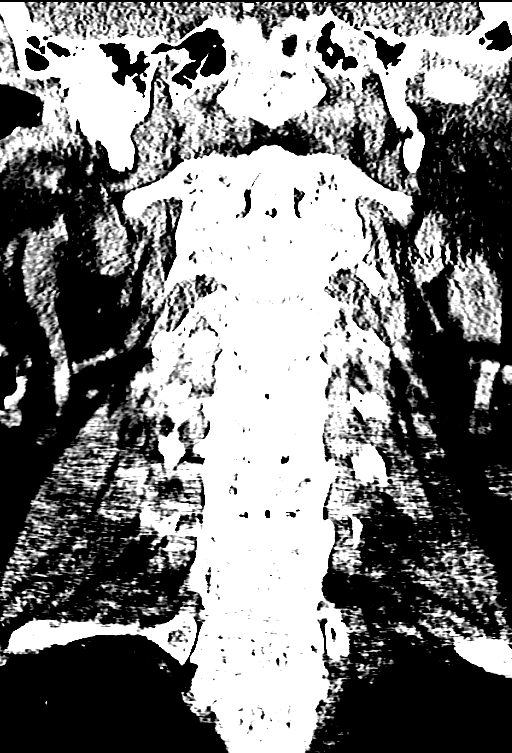
[im 23/52  brain]
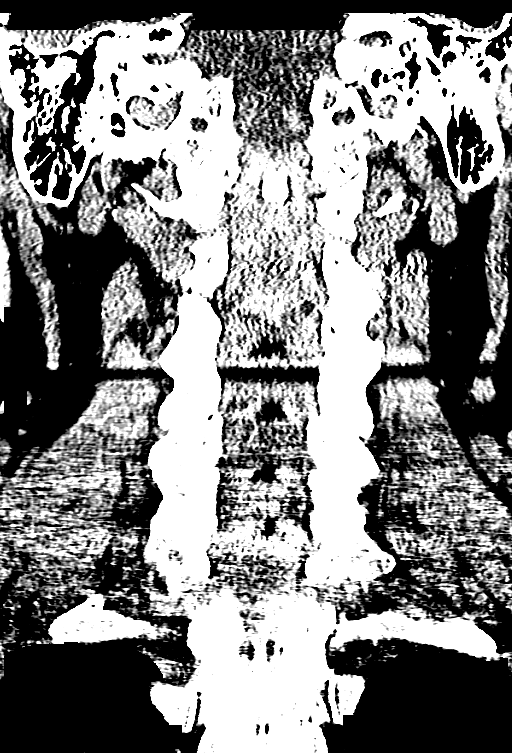
[im 29/52  brain]
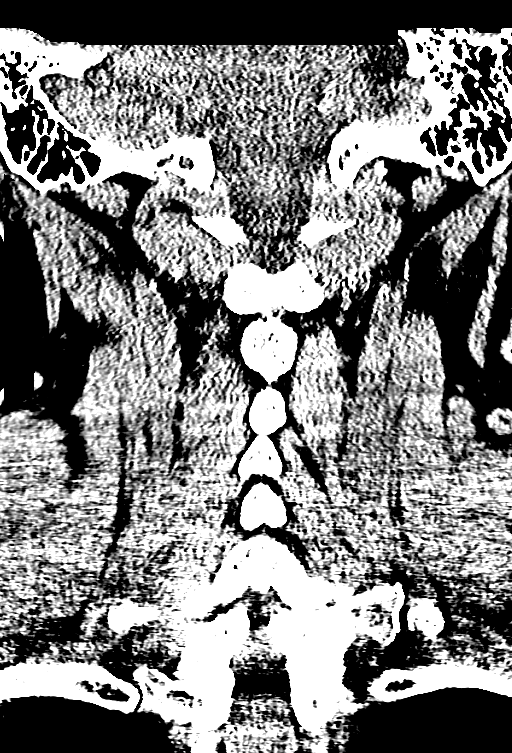

[Series 9: orthogonals · axial · 0.19mm/px · z∈[-318,-182]mm · 10 of 86 slices shown, 13 images]
[im 8/86  brain]
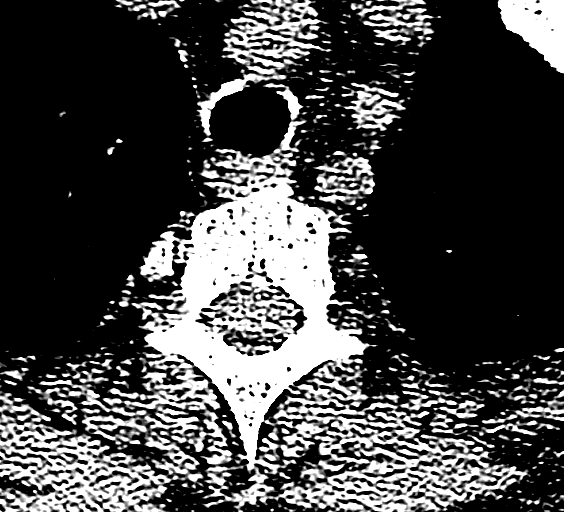
[im 8/86  bone]
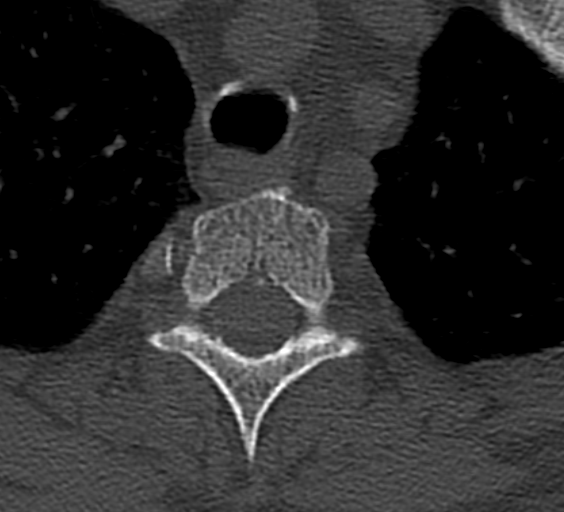
[im 15/86  brain]
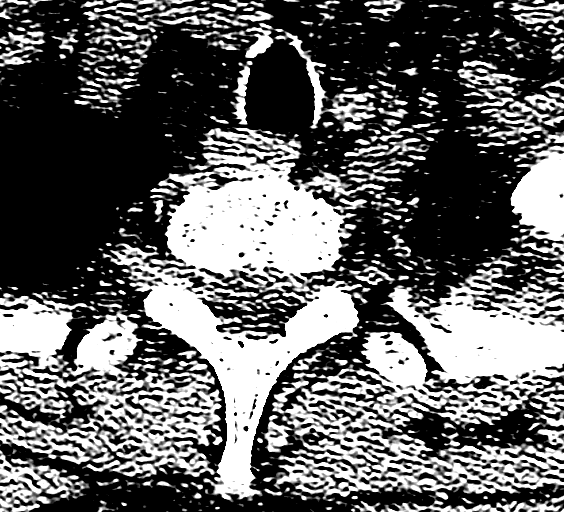
[im 22/86  brain]
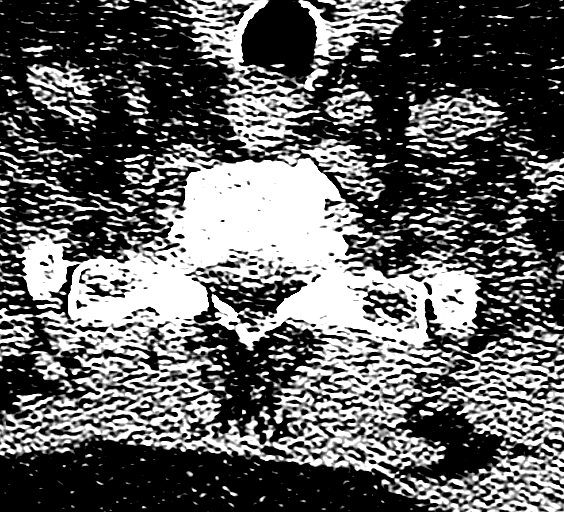
[im 29/86  brain]
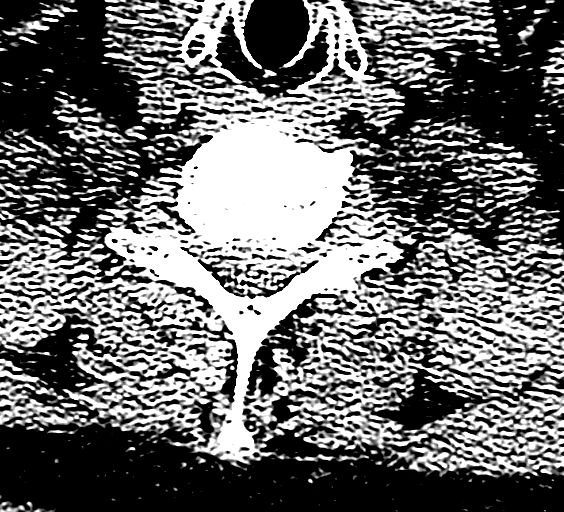
[im 36/86  brain]
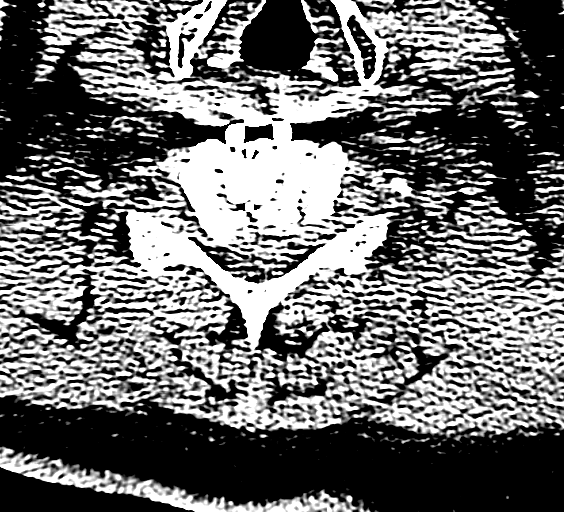
[im 36/86  bone]
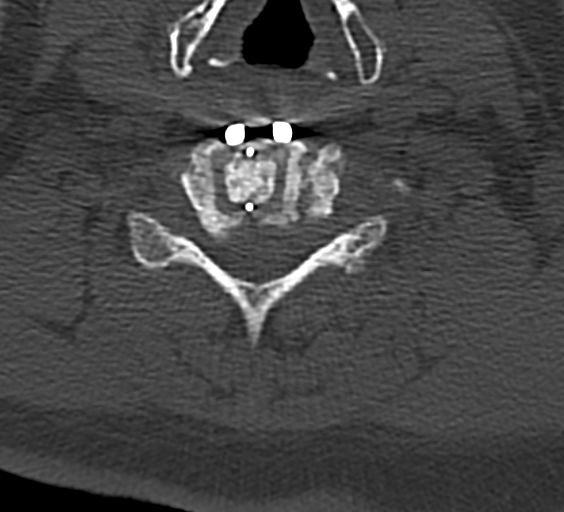
[im 50/86  brain]
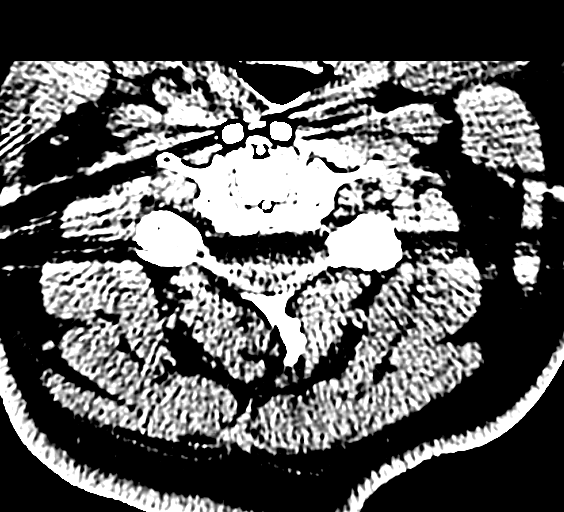
[im 57/86  brain]
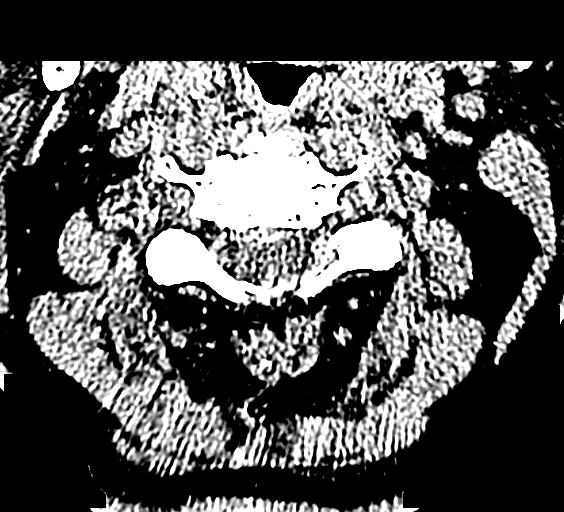
[im 64/86  brain]
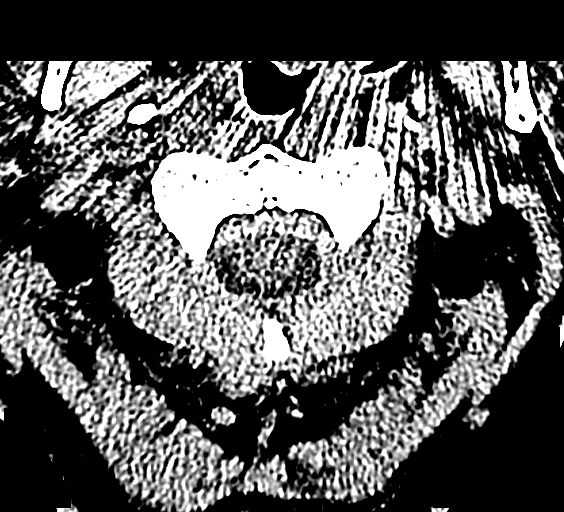
[im 71/86  brain]
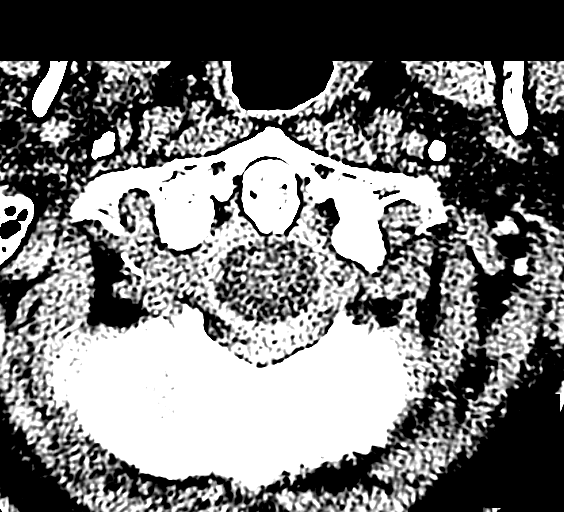
[im 71/86  bone]
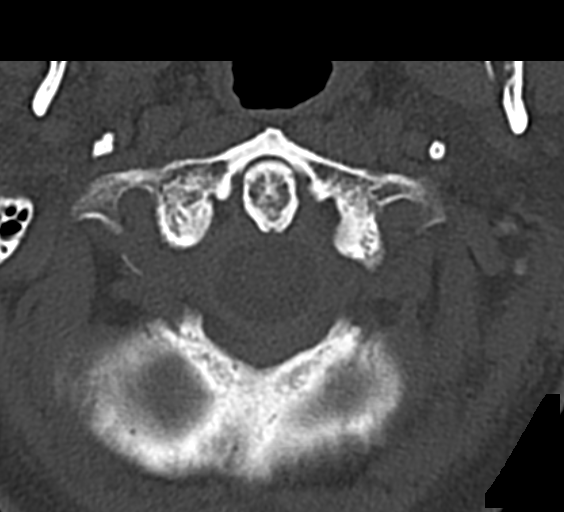
[im 78/86  brain]
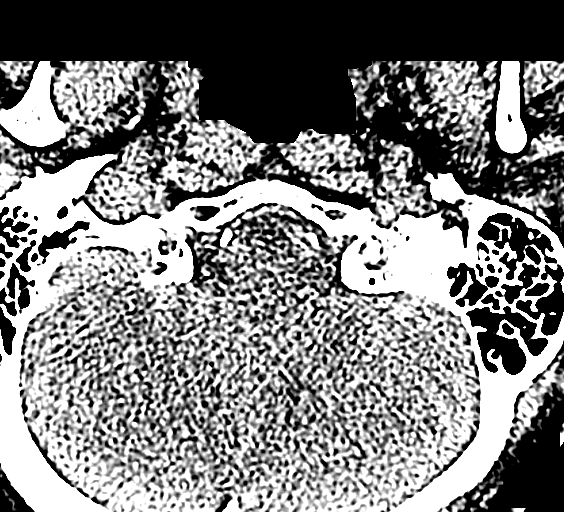

[13 of 47 positions shown; findings below may reference images not displayed]

FINDINGS: CT HEAD FINDINGS

Skull and Sinuses:No fracture or destructive process.

Orbits: Bilateral cataract resection.

Brain: No evidence of acute abnormality, such as acute infarction,
hemorrhage, hydrocephalus, or mass lesion/mass effect. Generalized
cerebral volume loss.

CT CERVICAL SPINE FINDINGS

Status post C3-4, C4-5, and C5-6 anterior cervical discectomy with
ventral plate and screw fixation. There is complete interbody
osseous fusion. No definitive progression of adjacent level
degenerative disc disease at C6-7. No evidence of hardware fracture
or displacement.

No osseous fracture or traumatic malalignment. No prevertebral edema
or gross cervical canal hematoma.
IMPRESSION: 1. No evidence of acute intracranial or cervical spine injury.
2. C3-C6 ACDF. No adverse features. There has been completed osseous
interbody fusion.
3. Generalized brain atrophy.

## 2016-04-05 DIAGNOSIS — K30 Functional dyspepsia: Secondary | ICD-10-CM | POA: Diagnosis not present

## 2016-04-05 DIAGNOSIS — K3184 Gastroparesis: Secondary | ICD-10-CM | POA: Diagnosis not present

## 2016-04-11 ENCOUNTER — Telehealth: Payer: Self-pay | Admitting: Internal Medicine

## 2016-04-11 NOTE — Telephone Encounter (Signed)
Pt is in [facility] for winter (she gets second Prolia injection there). Will return sometime in early spring.

## 2016-04-11 NOTE — Telephone Encounter (Signed)
Prolia benefits verified No PA required NO out of pocket for the patient  Message sent to Gilmore Laroche to order Arrie Eastern can you reach out to schedule the patient ?

## 2016-04-23 DIAGNOSIS — G2581 Restless legs syndrome: Secondary | ICD-10-CM | POA: Diagnosis not present

## 2016-04-23 DIAGNOSIS — G629 Polyneuropathy, unspecified: Secondary | ICD-10-CM | POA: Diagnosis not present

## 2016-04-23 DIAGNOSIS — E119 Type 2 diabetes mellitus without complications: Secondary | ICD-10-CM | POA: Diagnosis not present

## 2016-05-03 DIAGNOSIS — E538 Deficiency of other specified B group vitamins: Secondary | ICD-10-CM | POA: Diagnosis not present

## 2016-05-08 ENCOUNTER — Telehealth: Payer: Self-pay | Admitting: *Deleted

## 2016-05-08 NOTE — Telephone Encounter (Signed)
No voicemail option 

## 2016-05-23 DIAGNOSIS — G2581 Restless legs syndrome: Secondary | ICD-10-CM | POA: Diagnosis not present

## 2016-05-23 DIAGNOSIS — F419 Anxiety disorder, unspecified: Secondary | ICD-10-CM | POA: Diagnosis not present

## 2016-05-23 DIAGNOSIS — E039 Hypothyroidism, unspecified: Secondary | ICD-10-CM | POA: Diagnosis not present

## 2016-05-23 DIAGNOSIS — E119 Type 2 diabetes mellitus without complications: Secondary | ICD-10-CM | POA: Diagnosis not present

## 2016-05-23 DIAGNOSIS — Z0000 Encounter for general adult medical examination without abnormal findings: Secondary | ICD-10-CM | POA: Diagnosis not present

## 2016-05-23 DIAGNOSIS — Z139 Encounter for screening, unspecified: Secondary | ICD-10-CM | POA: Diagnosis not present

## 2016-05-23 DIAGNOSIS — G4700 Insomnia, unspecified: Secondary | ICD-10-CM | POA: Diagnosis not present

## 2016-05-23 DIAGNOSIS — I340 Nonrheumatic mitral (valve) insufficiency: Secondary | ICD-10-CM | POA: Diagnosis not present

## 2016-05-23 DIAGNOSIS — Z6827 Body mass index (BMI) 27.0-27.9, adult: Secondary | ICD-10-CM | POA: Diagnosis not present

## 2016-05-23 DIAGNOSIS — Z1231 Encounter for screening mammogram for malignant neoplasm of breast: Secondary | ICD-10-CM | POA: Diagnosis not present

## 2016-05-23 DIAGNOSIS — K219 Gastro-esophageal reflux disease without esophagitis: Secondary | ICD-10-CM | POA: Diagnosis not present

## 2016-05-23 DIAGNOSIS — I10 Essential (primary) hypertension: Secondary | ICD-10-CM | POA: Diagnosis not present

## 2016-05-25 ENCOUNTER — Other Ambulatory Visit: Payer: Self-pay | Admitting: *Deleted

## 2016-05-25 MED ORDER — METFORMIN HCL ER 500 MG PO TB24: 2000.0000 mg | ORAL_TABLET | Freq: Every day | ORAL | 1 refills | Status: DC

## 2016-05-29 DIAGNOSIS — E538 Deficiency of other specified B group vitamins: Secondary | ICD-10-CM | POA: Diagnosis not present

## 2016-05-30 ENCOUNTER — Telehealth: Payer: Self-pay | Admitting: Endocrinology

## 2016-05-30 DIAGNOSIS — R140 Abdominal distension (gaseous): Secondary | ICD-10-CM | POA: Diagnosis not present

## 2016-05-30 DIAGNOSIS — K30 Functional dyspepsia: Secondary | ICD-10-CM | POA: Diagnosis not present

## 2016-05-30 NOTE — Telephone Encounter (Signed)
Pt called in and said that she has been taking four of the Metformin, but doing two in the morning and two at night because she said her blood pressure had been effected by this medication. She wants to know if this is okay and would like a call back.

## 2016-05-31 NOTE — Telephone Encounter (Signed)
I contacted the patient and advised of MD's message via voicemail.

## 2016-05-31 NOTE — Telephone Encounter (Signed)
That is fine 

## 2016-06-07 DIAGNOSIS — M810 Age-related osteoporosis without current pathological fracture: Secondary | ICD-10-CM | POA: Diagnosis not present

## 2016-06-18 DIAGNOSIS — I349 Nonrheumatic mitral valve disorder, unspecified: Secondary | ICD-10-CM | POA: Diagnosis not present

## 2016-06-19 DIAGNOSIS — Z170 Estrogen receptor positive status [ER+]: Secondary | ICD-10-CM | POA: Diagnosis not present

## 2016-06-19 DIAGNOSIS — M810 Age-related osteoporosis without current pathological fracture: Secondary | ICD-10-CM | POA: Diagnosis not present

## 2016-06-19 DIAGNOSIS — C50412 Malignant neoplasm of upper-outer quadrant of left female breast: Secondary | ICD-10-CM | POA: Diagnosis not present

## 2016-06-21 ENCOUNTER — Other Ambulatory Visit: Payer: Self-pay | Admitting: Internal Medicine

## 2016-06-26 DIAGNOSIS — E538 Deficiency of other specified B group vitamins: Secondary | ICD-10-CM | POA: Diagnosis not present

## 2016-07-04 DIAGNOSIS — K3184 Gastroparesis: Secondary | ICD-10-CM | POA: Diagnosis not present

## 2016-07-04 DIAGNOSIS — R140 Abdominal distension (gaseous): Secondary | ICD-10-CM | POA: Diagnosis not present

## 2016-07-12 DIAGNOSIS — R0989 Other specified symptoms and signs involving the circulatory and respiratory systems: Secondary | ICD-10-CM | POA: Diagnosis not present

## 2016-07-12 DIAGNOSIS — I201 Angina pectoris with documented spasm: Secondary | ICD-10-CM | POA: Diagnosis not present

## 2016-07-12 DIAGNOSIS — R55 Syncope and collapse: Secondary | ICD-10-CM | POA: Diagnosis not present

## 2016-07-24 DIAGNOSIS — E538 Deficiency of other specified B group vitamins: Secondary | ICD-10-CM | POA: Diagnosis not present

## 2016-07-25 DIAGNOSIS — I6523 Occlusion and stenosis of bilateral carotid arteries: Secondary | ICD-10-CM | POA: Diagnosis not present

## 2016-07-30 DIAGNOSIS — I201 Angina pectoris with documented spasm: Secondary | ICD-10-CM | POA: Diagnosis not present

## 2016-07-30 DIAGNOSIS — R9439 Abnormal result of other cardiovascular function study: Secondary | ICD-10-CM | POA: Diagnosis not present

## 2016-07-31 DIAGNOSIS — R55 Syncope and collapse: Secondary | ICD-10-CM | POA: Diagnosis not present

## 2016-08-01 DIAGNOSIS — R110 Nausea: Secondary | ICD-10-CM | POA: Diagnosis not present

## 2016-08-01 DIAGNOSIS — K3184 Gastroparesis: Secondary | ICD-10-CM | POA: Diagnosis not present

## 2016-08-06 DIAGNOSIS — I10 Essential (primary) hypertension: Secondary | ICD-10-CM | POA: Diagnosis not present

## 2016-08-06 DIAGNOSIS — I6529 Occlusion and stenosis of unspecified carotid artery: Secondary | ICD-10-CM | POA: Diagnosis not present

## 2016-08-06 DIAGNOSIS — I350 Nonrheumatic aortic (valve) stenosis: Secondary | ICD-10-CM | POA: Diagnosis not present

## 2016-08-06 DIAGNOSIS — R9439 Abnormal result of other cardiovascular function study: Secondary | ICD-10-CM | POA: Diagnosis not present

## 2016-08-14 DIAGNOSIS — Z1231 Encounter for screening mammogram for malignant neoplasm of breast: Secondary | ICD-10-CM | POA: Diagnosis not present

## 2016-08-16 DIAGNOSIS — H401130 Primary open-angle glaucoma, bilateral, stage unspecified: Secondary | ICD-10-CM | POA: Diagnosis not present

## 2016-08-29 ENCOUNTER — Other Ambulatory Visit: Payer: Medicare Other

## 2016-08-29 ENCOUNTER — Ambulatory Visit: Payer: Medicare Other | Admitting: Endocrinology

## 2016-08-31 ENCOUNTER — Ambulatory Visit (INDEPENDENT_AMBULATORY_CARE_PROVIDER_SITE_OTHER): Payer: Medicare Other

## 2016-08-31 DIAGNOSIS — E538 Deficiency of other specified B group vitamins: Secondary | ICD-10-CM

## 2016-08-31 MED ORDER — CYANOCOBALAMIN 1000 MCG/ML IJ SOLN
1000.0000 ug | Freq: Once | INTRAMUSCULAR | Status: AC
  Administered 2016-08-31: 1000 ug via INTRAMUSCULAR

## 2016-09-03 ENCOUNTER — Other Ambulatory Visit: Payer: Self-pay | Admitting: Endocrinology

## 2016-09-03 ENCOUNTER — Ambulatory Visit: Payer: Medicare Other | Admitting: Endocrinology

## 2016-09-03 DIAGNOSIS — Z794 Long term (current) use of insulin: Principal | ICD-10-CM

## 2016-09-03 DIAGNOSIS — E1143 Type 2 diabetes mellitus with diabetic autonomic (poly)neuropathy: Secondary | ICD-10-CM | POA: Diagnosis not present

## 2016-09-03 DIAGNOSIS — E063 Autoimmune thyroiditis: Secondary | ICD-10-CM

## 2016-09-03 DIAGNOSIS — K5904 Chronic idiopathic constipation: Secondary | ICD-10-CM | POA: Diagnosis not present

## 2016-09-03 DIAGNOSIS — K760 Fatty (change of) liver, not elsewhere classified: Secondary | ICD-10-CM | POA: Diagnosis not present

## 2016-09-03 DIAGNOSIS — E119 Type 2 diabetes mellitus without complications: Secondary | ICD-10-CM

## 2016-09-03 DIAGNOSIS — E559 Vitamin D deficiency, unspecified: Secondary | ICD-10-CM

## 2016-09-03 DIAGNOSIS — K6389 Other specified diseases of intestine: Secondary | ICD-10-CM | POA: Diagnosis not present

## 2016-09-03 DIAGNOSIS — K3184 Gastroparesis: Secondary | ICD-10-CM | POA: Diagnosis not present

## 2016-09-07 ENCOUNTER — Other Ambulatory Visit (INDEPENDENT_AMBULATORY_CARE_PROVIDER_SITE_OTHER): Payer: Medicare Other

## 2016-09-07 ENCOUNTER — Other Ambulatory Visit: Payer: Self-pay

## 2016-09-07 DIAGNOSIS — Z794 Long term (current) use of insulin: Secondary | ICD-10-CM | POA: Diagnosis not present

## 2016-09-07 DIAGNOSIS — E119 Type 2 diabetes mellitus without complications: Secondary | ICD-10-CM

## 2016-09-07 DIAGNOSIS — E559 Vitamin D deficiency, unspecified: Secondary | ICD-10-CM

## 2016-09-07 DIAGNOSIS — E063 Autoimmune thyroiditis: Secondary | ICD-10-CM | POA: Diagnosis not present

## 2016-09-07 LAB — COMPREHENSIVE METABOLIC PANEL
ALT: 33 U/L (ref 0–35)
AST: 29 U/L (ref 0–37)
Albumin: 4 g/dL (ref 3.5–5.2)
Alkaline Phosphatase: 29 U/L — ABNORMAL LOW (ref 39–117)
BUN: 20 mg/dL (ref 6–23)
CALCIUM: 9.4 mg/dL (ref 8.4–10.5)
CHLORIDE: 99 meq/L (ref 96–112)
CO2: 30 meq/L (ref 19–32)
CREATININE: 0.81 mg/dL (ref 0.40–1.20)
GFR: 73.19 mL/min (ref >60.00)
GLUCOSE: 228 mg/dL — AB (ref 70–99)
Potassium: 4.3 mEq/L (ref 3.5–5.1)
SODIUM: 136 meq/L (ref 135–145)
Total Bilirubin: 0.5 mg/dL (ref 0.2–1.2)
Total Protein: 7.4 g/dL (ref 6.0–8.3)

## 2016-09-07 LAB — HEMOGLOBIN A1C: HEMOGLOBIN A1C: 7.1 % — AB (ref 4.6–6.5)

## 2016-09-07 LAB — LIPID PANEL
Cholesterol: 155 mg/dL (ref 0–200)
HDL: 39.4 mg/dL (ref >39.00)
LDL CALC: 91 mg/dL (ref 0–99)
NonHDL: 115.49
TRIGLYCERIDES: 123 mg/dL (ref 0.0–149.0)
Total CHOL/HDL Ratio: 4
VLDL: 24.6 mg/dL (ref 0.0–40.0)

## 2016-09-07 LAB — TSH: TSH: 4.16 u[IU]/mL (ref 0.35–4.50)

## 2016-09-07 LAB — MICROALBUMIN / CREATININE URINE RATIO
CREATININE, U: 163.3 mg/dL
Microalb Creat Ratio: 1.9 mg/g (ref 0.0–30.0)
Microalb, Ur: 3.1 mg/dL — ABNORMAL HIGH (ref 0.0–1.9)

## 2016-09-07 LAB — T4, FREE: FREE T4: 0.96 ng/dL (ref 0.60–1.60)

## 2016-09-07 LAB — VITAMIN D 25 HYDROXY (VIT D DEFICIENCY, FRACTURES): VITD: 58.53 ng/mL (ref 30.00–100.00)

## 2016-09-07 MED ORDER — ONDANSETRON HCL 4 MG PO TABS: 4.0000 mg | ORAL_TABLET | Freq: Three times a day (TID) | ORAL | 0 refills | Status: DC | PRN

## 2016-09-10 ENCOUNTER — Encounter: Payer: Self-pay | Admitting: Endocrinology

## 2016-09-10 ENCOUNTER — Ambulatory Visit (INDEPENDENT_AMBULATORY_CARE_PROVIDER_SITE_OTHER): Payer: Medicare Other | Admitting: Endocrinology

## 2016-09-10 VITALS — BP 128/64 | HR 77 | Ht 64.0 in | Wt 158.0 lb

## 2016-09-10 DIAGNOSIS — E063 Autoimmune thyroiditis: Secondary | ICD-10-CM | POA: Diagnosis not present

## 2016-09-10 DIAGNOSIS — E1165 Type 2 diabetes mellitus with hyperglycemia: Secondary | ICD-10-CM

## 2016-09-10 DIAGNOSIS — E782 Mixed hyperlipidemia: Secondary | ICD-10-CM | POA: Diagnosis not present

## 2016-09-10 DIAGNOSIS — Z794 Long term (current) use of insulin: Secondary | ICD-10-CM

## 2016-09-10 NOTE — Progress Notes (Signed)
Patient ID: Sharon Armstrong, female   DOB: 1940/11/20, 76 y.o.   MRN: [id number]           Reason for Appointment: f/u  for Type 2 Diabetes  Referring physician: Larose Kells  History of Present Illness:       Background history: She had been treated for several years with metformin and Amaryl  Her control had been fairly good until about 2012 and she thinks that because of poor control with oral agents she was given Lantus insulin in addition She has been on Lantus and metformin since then with variable control  Recent history:   INSULIN regimen is described as: 32 units of Lantus in am   Oral hypoglycemic drugs the patient is taking are: Metformin ER 1 g twice a day    Her A1c is higher than usual at 7.1, previously as low as 5.9  Current blood sugar patterns, problems identified:  She has a higher A1c than usual but her fasting readings are reportedly still fairly good  She appears to be taking the same amount of Lantus as before  Unable to review that sugars from her monitor since there is no date or time programmed  Again she is checking readings mostly fasting and these are usually not significantly high  Her lab glucose was 228 after her usual breakfast of eggs and toast; also has had a reading of 228 at home nonfasting but she does not know when.  She will only check readings after meals when she does not feel well  Because of her gastroparesis she is eating about 6 small meals which are small and quantity but occasionally she will have Ensure also; she thinks this is low carbohydrate does not know definitely  She will usually have some carbohydrates like rice or pasta with her evening meal Currently not exercising much for various reasons  She lives 6 months in [place] and has a physician there also      Side effects from medications have been: None  Compliance with the medical regimen: Good  Hypoglycemia:   none   Glucose monitoring:  done 1-2  times a day          Glucometer:  Accucheck  Blood Glucose readings by download show recent range of 89-1 32, mostly fasting     Self-care:                   Dinner at 6-7 pm Diet consultation was in 6/16               Exercise: not walking now    Weight history:    Wt Readings from Last 3 Encounters:  09/10/16 158 lb (71.7 kg)  02/03/16 154 lb 3.2 oz (69.9 kg)  01/09/16 154 lb 2 oz (69.9 kg)    Glycemic control:   Lab Results  Component Value Date   HGBA1C 7.1 (H) 09/07/2016   HGBA1C 5.9 02/03/2016   HGBA1C 6.8 (H) 02/17/2015   Lab Results  Component Value Date   MICROALBUR 3.1 (H) 09/07/2016   LDLCALC 91 09/07/2016   CREATININE 0.81 09/07/2016       Allergies as of 09/10/2016      Reactions   Gabapentin Swelling   Edema, legs    Iodine Hives   Iohexol    Nsaids Other (See Comments)   REACTION: hallucinations   Tolmetin Other (See Comments)   REACTION: hallucinations   Aciphex [rabeprazole] Rash   Ioxaglate Rash, Hives  Ivp Dye [iodinated Diagnostic Agents] Hives, Rash   Nexium [esomeprazole Magnesium] Rash   Pepcid [famotidine] Rash   Protonix [pantoprazole Sodium] Rash      Medication List       Accurate as of 09/10/16  2:46 PM. Always use your most recent med list.          ACCU-CHEK COMPACT PLUS test strip Generic drug:  glucose blood CHECK BLOOD SUGARS NO MORE THAN TWICE DAILY DX E11.9   ASPERCREME LIDOCAINE 4 % Ptch Generic drug:  Lidocaine Apply topically.   carbidopa-levodopa 25-250 MG disintegrating tablet Commonly known as:  PARCOPA Take 1-2 tablets by mouth at bedtime.   clonazePAM 0.5 MG tablet Commonly known as:  KLONOPIN Take 1 tablet (0.5 mg total) by mouth 3 (three) times daily as needed for anxiety.   Cyanocobalamin 1000 MCG/ML Kit Inject as directed.   estradiol 0.1 MG/GM vaginal cream Commonly known as:  ESTRACE Place 1 Applicatorful vaginally as needed. Reported on 09/07/2015   LANTUS SOLOSTAR 100 UNIT/ML Solostar Pen Generic drug:   Insulin Glargine INJECT 38 UNITS INTO THE SKIN DAILY .   levothyroxine 100 MCG tablet Commonly known as:  SYNTHROID, LEVOTHROID Take 1 tablet (100 mcg total) by mouth daily.   lisinopril 10 MG tablet Commonly known as:  PRINIVIL,ZESTRIL Take 0.5 tablets (5 mg total) by mouth daily.   meloxicam 7.5 MG tablet Commonly known as:  MOBIC Take 7.5 mg by mouth daily as needed for pain.   metFORMIN 500 MG 24 hr tablet Commonly known as:  GLUCOPHAGE-XR Take 4 tablets (2,000 mg total) by mouth daily with supper.   metoCLOPramide 10 MG tablet Commonly known as:  REGLAN Take 10 mg by mouth.   milk thistle 175 MG tablet Take 175 mg by mouth daily.   omeprazole 40 MG capsule Commonly known as:  PRILOSEC Take 1 capsule (40 mg total) by mouth 2 (two) times daily.   ondansetron 4 MG tablet Commonly known as:  ZOFRAN Take 1 tablet (4 mg total) by mouth every 8 (eight) hours as needed for nausea or vomiting.   ONETOUCH DELICA LANCETS Misc Check once daily.   traMADol 50 MG tablet Commonly known as:  ULTRAM Take 2 tablets by mouth 2 (two) times daily.   triamterene-hydrochlorothiazide 75-50 MG tablet Commonly known as:  MAXZIDE Take 0.5 tablets by mouth daily.       Allergies:  Allergies  Allergen Reactions  . Gabapentin Swelling    Edema, legs   . Iodine Hives  . Iohexol   . Nsaids Other (See Comments)    REACTION: hallucinations  . Tolmetin Other (See Comments)    REACTION: hallucinations  . Aciphex [Rabeprazole] Rash  . Ioxaglate Rash and Hives  . Ivp Dye [Iodinated Diagnostic Agents] Hives and Rash  . Nexium [Esomeprazole Magnesium] Rash  . Pepcid [Famotidine] Rash  . Protonix [Pantoprazole Sodium] Rash    Past Medical History:  Diagnosis Date  . Adenomatous polyps 11/1998  . Anxiety   . Aortic valve insufficiency    mild  . Breast cancer ([place]) 04/2007   s/p XRT (last 4/09)  . Cervical myelopathy ([place])   . Cervical stenosis of spine    sees neuro surgery  and dr.ramons s/p local injection 4/09  . Chronic constipation   . Chronic pancreatitis ([place]) 09/29/2014  . Colon polyps 02/2010   Tubular  Adenomas                                     .  Diabetes mellitus   . Difficulty in urination    pt uses cath at times  . Diverticulosis of colon   . Elevated liver function tests    fatty liver per CT 2009  . Fatty liver   . Full body hives    if patient does not take Fexofenadine  . Gastroparesis 10/2015   gastric emptying abnormal for 4 hours  . GERD (gastroesophageal reflux disease)   . Hemorrhoids   . Hyperlipemia   . Hypertension   . Hypothyroidism   . Insomnia   . Lumbar spondylosis    Lumbar epidural steroid injection L5-S1 to the left, Dr. Nelva Bush  . Mitral valve regurgitation   . Osteoarthritis   . 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 Restless leg syndrome   . Urticaria 06/2009    Past Surgical History:  Procedure Laterality Date  . ANTERIOR CERVICAL DECOMP/DISCECTOMY FUSION N/A 01/08/2013   Procedure: Cervical three-four, four-five, five-six anterior cervical decompression with fusion plating and bonegraft;  Surgeon: Eustace Moore, MD;  Location: [facility];  Service: Neurosurgery;  Laterality: N/A;  Cervical three-four, four-five, five-six anterior cervical decompression with fusion plating and bonegraft  . APPENDECTOMY    . BREAST LUMPECTOMY Left 2009   left breast  . CARDIAC CATHETERIZATION  2001  . CATARACT EXTRACTION, BILATERAL  2/11  . EYE SURGERY    . GANGLION CYST EXCISION Right    wrist  . sinus cyst removed    . UTERINE FIBROID SURGERY      Family History  Problem Relation Age of Onset  . Coronary artery disease Neg Hx   . Hypertension Mother   . Diabetes Neg Hx   . Stroke Neg Hx   . Colon cancer Neg Hx   . Breast cancer Neg Hx   . Lung cancer Mother     mets to brain  . Tuberculosis Father     died at 64  . Brain cancer Mother     Social History:  reports that she has never smoked. She has  never used smokeless tobacco. She reports that she drinks alcohol. She reports that she does not use drugs.    Review of Systems    Lipid history: Her levels are showing Improved LDL, patient has not been on any lipid-lowering drugs     Lab Results  Component Value Date   CHOL 155 09/07/2016   HDL 39.40 09/07/2016   LDLCALC 91 09/07/2016   TRIG 123.0 09/07/2016   CHOLHDL 4 09/07/2016           Eyes:  Most recent eye exam was in 4/17 and has no known retinopathy  Hypertension: She is on lisinopril 10 mg and Maxzide with good control  Gastrointestinal: History of fatty liver/?  Cirrhosis She was also told to have small bowel bacterial overgrowth in [place]  Has possible gastroparesis with some nausea with vomiting and is on a special diet  Lab Results  Component Value Date   ALT 33 09/07/2016     Endocrine:  Long history of thyroid disease: Was told in her teen years that she was hypothyroid and this may have been a sequela to possible radiation treatment in childhood in [place]  TSH stable    Lab Results  Component Value Date   TSH 4.16 09/07/2016   TSH 3.58 09/07/2015   TSH 3.23 02/17/2015   FREET4 0.96 09/07/2016   FREET4 0.81 02/17/2015    Foot exam in 9/17  showed:  Decreased monofilament sensation on the toes and distal plantar surfaces of the right side  Physical Examination:  BP 128/64   Pulse 77   Ht '5\' 4"'$  (1.626 m)   Wt 158 lb (71.7 kg)   BMI 27.12 kg/m         ASSESSMENT/PLAN:   Diabetes type 2, uncontrolled    See history of present illness for detailed discussion of current diabetes management, blood sugar patterns and problems identified  Her A1c has gone up Although she has not gone off her diet significantly her gain weight and to be getting postprandial hyperglycemia Currently only on basal insulin and metformin She is focusing only on fasting readings and is not aware of her postprandial hyperglycemia which readings over 200 at least  a couple of times that have been documented even without excessive carbohydrate Not clear if she is getting the regular Ensure which may be causing some high readings She is mostly eating small meals  She will benefit from starting her mealtime insulin as discussed in detail today Although have explained to her to need for a rapid insulin before each meal that has carbohydrate she was also explained the use of the V-go pump as an option She is interested in trial of this Most likely she can do well with the 30 unit basal to start with and about 4 units of bolus with every meal that has carbohydrate, this is probably about 2-3 times a day She will need to review her diet with nurse educator also Encouraged her to start walking for exercise  LIPIDS: LDL is below 100 and She can continue diet alone  HYPOTHYROID: Adequately replaced with the normal TSH now   Counseling time on subjects discussed above is over 50% of today's 25 minute visit   There are no Patient Instructions on file for this visit.  Kinslea Frances 09/10/2016, 2:46 PM   Note: This office note was prepared with Dragon voice recognition system technology. Any transcriptional errors that result from this process are unintentional. `

## 2016-09-11 ENCOUNTER — Telehealth: Payer: Self-pay | Admitting: Internal Medicine

## 2016-09-11 NOTE — Telephone Encounter (Signed)
Please advise 

## 2016-09-11 NOTE — Telephone Encounter (Signed)
Needs office visit.

## 2016-09-11 NOTE — Telephone Encounter (Signed)
Pt called in requesting a call back from [place]. She says that she would like to have something for sleeping. Pt says that she dont sleep well at night.  CB: [phone]

## 2016-09-12 ENCOUNTER — Other Ambulatory Visit: Payer: Self-pay

## 2016-09-12 ENCOUNTER — Encounter: Payer: Medicare Other | Admitting: Nutrition

## 2016-09-12 MED ORDER — LISINOPRIL 10 MG PO TABS: 5.0000 mg | ORAL_TABLET | Freq: Every day | ORAL | 1 refills | Status: DC

## 2016-09-12 NOTE — Telephone Encounter (Signed)
LMOM informing Pt to return call.  

## 2016-09-19 ENCOUNTER — Encounter: Payer: Medicare Other | Admitting: Nutrition

## 2016-09-20 ENCOUNTER — Ambulatory Visit: Payer: Medicare Other | Admitting: Endocrinology

## 2016-09-26 ENCOUNTER — Ambulatory Visit: Payer: Medicare Other | Admitting: Endocrinology

## 2016-09-26 DIAGNOSIS — D1801 Hemangioma of skin and subcutaneous tissue: Secondary | ICD-10-CM | POA: Diagnosis not present

## 2016-09-26 DIAGNOSIS — L818 Other specified disorders of pigmentation: Secondary | ICD-10-CM | POA: Diagnosis not present

## 2016-09-28 ENCOUNTER — Encounter: Payer: Self-pay | Admitting: Internal Medicine

## 2016-09-28 ENCOUNTER — Ambulatory Visit: Payer: Medicare Other

## 2016-09-28 ENCOUNTER — Ambulatory Visit (INDEPENDENT_AMBULATORY_CARE_PROVIDER_SITE_OTHER): Payer: Medicare Other | Admitting: Internal Medicine

## 2016-09-28 ENCOUNTER — Telehealth: Payer: Self-pay

## 2016-09-28 VITALS — BP 126/74 | HR 74 | Temp 97.8°F | Resp 14 | Ht 64.0 in | Wt 154.5 lb

## 2016-09-28 DIAGNOSIS — E538 Deficiency of other specified B group vitamins: Secondary | ICD-10-CM

## 2016-09-28 DIAGNOSIS — G4700 Insomnia, unspecified: Secondary | ICD-10-CM | POA: Diagnosis not present

## 2016-09-28 DIAGNOSIS — R5382 Chronic fatigue, unspecified: Secondary | ICD-10-CM | POA: Diagnosis not present

## 2016-09-28 DIAGNOSIS — Z09 Encounter for follow-up examination after completed treatment for conditions other than malignant neoplasm: Secondary | ICD-10-CM

## 2016-09-28 MED ORDER — CYANOCOBALAMIN 1000 MCG/ML IJ SOLN
1000.0000 ug | Freq: Once | INTRAMUSCULAR | Status: AC
  Administered 2016-09-28: 1000 ug via INTRAMUSCULAR

## 2016-09-28 MED ORDER — ZOLPIDEM TARTRATE 10 MG PO TABS
5.0000 mg | ORAL_TABLET | Freq: Every evening | ORAL | 1 refills | Status: DC | PRN
Start: 2016-09-28 — End: 2017-03-04

## 2016-09-28 MED ORDER — MELOXICAM 7.5 MG PO TABS: 7.5000 mg | ORAL_TABLET | Freq: Every day | ORAL | 0 refills | Status: DC | PRN

## 2016-09-28 NOTE — Progress Notes (Signed)
Subjective:    Patient ID: Sharon Armstrong, female    DOB: 09-18-1940, 76 y.o.   MRN: [id number]  DOS:  09/28/2016 Type of visit - description : acute Interval history: Here with her husband. Chief complaint is difficulty sleeping. This is a chronic issue, has a leftover Ambien that she takes sporadically with great results. Anxiety so far is okay, not needing clonazepam. Has chronic pain, on Ultram and sporadically take the meloxicam without apparent side effects. Needs a refill.    Review of Systems   Past Medical History:  Diagnosis Date  . Adenomatous polyps 11/1998  . Anxiety   . Aortic valve insufficiency    mild  . Breast cancer ([place]) 04/2007   s/p XRT (last 4/09)  . Cervical myelopathy ([place])   . Cervical stenosis of spine    sees neuro surgery and dr.ramons s/p local injection 4/09  . Chronic constipation   . Chronic pancreatitis ([place]) 09/29/2014  . Colon polyps 02/2010   Tubular  Adenomas                                     . Diabetes mellitus   . Difficulty in urination    pt uses cath at times  . Diverticulosis of colon   . Elevated liver function tests    fatty liver per CT 2009  . Fatty liver   . Full body hives    if patient does not take Fexofenadine  . Gastroparesis 10/2015   gastric emptying abnormal for 4 hours  . GERD (gastroesophageal reflux disease)   . Hemorrhoids   . Hyperlipemia   . Hypertension   . Hypothyroidism   . Insomnia   . Lumbar spondylosis    Lumbar epidural steroid injection L5-S1 to the left, Dr. Nelva Bush  . Mitral valve regurgitation   . Osteoarthritis   . 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 Restless leg syndrome   . Urticaria 06/2009    Past Surgical History:  Procedure Laterality Date  . ANTERIOR CERVICAL DECOMP/DISCECTOMY FUSION N/A 01/08/2013   Procedure: Cervical three-four, four-five, five-six anterior cervical decompression with fusion plating and bonegraft;  Surgeon: Eustace Moore, MD;  Location: [facility];  Service: Neurosurgery;  Laterality: N/A;  Cervical three-four, four-five, five-six anterior cervical decompression with fusion plating and bonegraft  . APPENDECTOMY    . BREAST LUMPECTOMY Left 2009   left breast  . CARDIAC CATHETERIZATION  2001  . CATARACT EXTRACTION, BILATERAL  2/11  . EYE SURGERY    . GANGLION CYST EXCISION Right    wrist  . sinus cyst removed    . UTERINE FIBROID SURGERY      Social History   Social History  . Marital status: Married    Spouse name: N/A  . Number of children: 2  . Years of education: N/A   Occupational History  . custom decorator    Social History Main Topics  . Smoking status: Never Smoker  . Smokeless tobacco: Never Used  . Alcohol use 0.0 oz/week     Comment: rare  . Drug use: No  . Sexual activity: Not on file   Other Topics Concern  . Not on file   Social History Narrative   Lives in [place] half of the time          Allergies as of 09/28/2016  Reactions   Gabapentin Swelling   Edema, legs    Iodine Hives   Iohexol    Nsaids Other (See Comments)   REACTION: hallucinations   Tolmetin Other (See Comments)   REACTION: hallucinations   Aciphex [rabeprazole] Rash   Ioxaglate Rash, Hives   Ivp Dye [iodinated Diagnostic Agents] Hives, Rash   Nexium [esomeprazole Magnesium] Rash   Pepcid [famotidine] Rash   Protonix [pantoprazole Sodium] Rash      Medication List       Accurate as of 09/28/16 11:59 PM. Always use your most recent med list.          ACCU-CHEK COMPACT PLUS test strip Generic drug:  glucose blood CHECK BLOOD SUGARS NO MORE THAN TWICE DAILY DX E11.9   ASPERCREME LIDOCAINE 4 % Ptch Generic drug:  Lidocaine Apply topically.   carbidopa-levodopa 25-250 MG disintegrating tablet Commonly known as:  PARCOPA Take 1-2 tablets by mouth at bedtime.   Cyanocobalamin 1000 MCG/ML Kit Inject as directed.   estradiol 0.1 MG/GM vaginal cream Commonly known as:  ESTRACE Place 1  Applicatorful vaginally as needed. Reported on 09/07/2015   LANTUS SOLOSTAR 100 UNIT/ML Solostar Pen Generic drug:  Insulin Glargine INJECT 38 UNITS INTO THE SKIN DAILY .   levothyroxine 100 MCG tablet Commonly known as:  SYNTHROID, LEVOTHROID Take 1 tablet (100 mcg total) by mouth daily.   lisinopril 10 MG tablet Commonly known as:  PRINIVIL,ZESTRIL Take 0.5 tablets (5 mg total) by mouth daily.   meloxicam 7.5 MG tablet Commonly known as:  MOBIC Take 1 tablet (7.5 mg total) by mouth daily as needed for pain.   metFORMIN 500 MG 24 hr tablet Commonly known as:  GLUCOPHAGE-XR Take 4 tablets (2,000 mg total) by mouth daily with supper.   metoCLOPramide 10 MG tablet Commonly known as:  REGLAN Take 10 mg by mouth.   milk thistle 175 MG tablet Take 175 mg by mouth daily.   omeprazole 40 MG capsule Commonly known as:  PRILOSEC Take 1 capsule (40 mg total) by mouth 2 (two) times daily.   ondansetron 4 MG tablet Commonly known as:  ZOFRAN Take 1 tablet (4 mg total) by mouth every 8 (eight) hours as needed for nausea or vomiting.   ONETOUCH DELICA LANCETS Misc Check once daily.   traMADol 50 MG tablet Commonly known as:  ULTRAM Take 2 tablets by mouth 2 (two) times daily.   triamterene-hydrochlorothiazide 75-50 MG tablet Commonly known as:  MAXZIDE Take 0.5 tablets by mouth daily.   zolpidem 10 MG tablet Commonly known as:  AMBIEN Take 0.5-1 tablets (5-10 mg total) by mouth at bedtime as needed for sleep.          Objective:   Physical Exam BP 126/74 (BP Location: Left Arm, Patient Position: Sitting, Cuff Size: Small)   Pulse 74   Temp 97.8 F (36.6 C) (Oral)   Resp 14   Ht '5\' 4"'$  (1.626 m)   Wt 154 lb 8 oz (70.1 kg)   SpO2 96%   BMI 26.52 kg/m  General:   Well developed, well nourished . NAD.  HEENT:  Normocephalic . Face symmetric, atraumatic Skin: Not pale. Not jaundice Neurologic:  alert & oriented X3.  Speech normal, gait appropriate for age and  unassisted Psych--  Cognition and judgment appear intact.  Cooperative with normal attention span and concentration.  Behavior appropriate. No anxious or depressed appearing.      Assessment & Plan:   Assessment> DM -- used to see Dr  Kumar HTN Hyperlipidemia Hypothyroidism Anxiety , insomnia Murmur: Echo 04/12/2014 in [place] trace MR Chronic fatigue (rx empiric B12 shot 12-2014) RLS -  MSK: on ultram per pain mngmt --Spinal cervical stenosis, local injections, surgery 12-2012 --Low back pain-- Dr Nelva Bush --DJD  --osteoporosis, last dexa 01-2014, last Prolia 10-2015 GI: used to see Dr Fuller Plan, last OV 10-2014 , now Dr Roney Mans  --GERD, --Chronic pancreatitis, h/o episodes of acute pancreatitis --h/o fatty liver and ? Of early cirrhosis by MRI --Chronic constipation --Nausea, vomiting, eval in [place]~ 12- 2016: Delayed gastric emptying, EGD biopsy no H. Pylori,SIBO +, Rx ABX Breast cancer dx 2008 XRT H/o +PPD H/o Urticaria H/ o Mild aortic valve insufficiency  PLAN: Anxiety: Currently not taking clonazepam Insomnia: Chronic issue, good response to sporadic use of Ambien, new RX provided. Good habits discussed. Low back pain: On Ultram prescribed elsewhere, she takes a sporadic meloxicam, I issued a prescription, GI precautions discussed. Chronic fatigue: On B12 shots. Shot today. She has a number of other doctors here and in [place], chronic medical issues are handled elsewhere.

## 2016-09-28 NOTE — Progress Notes (Signed)
Pre visit review using our clinic review tool, if applicable. No additional management support is needed unless otherwise documented below in the visit note. 

## 2016-09-28 NOTE — Patient Instructions (Signed)
Take Ambien half or 1 tablet at needed at night to help you sleep   Take  meloxicam sporadically for pain.  Always take it with food because may cause gastritis and ulcers.  If you notice nausea, stomach pain, change in the color of stools --->  Stop the medicine and let us know   HEALTHY SLEEP Sleep hygiene: Basic rules for a good night's sleep  Sleep only as much as you need to feel rested and then get out of bed  Keep a regular sleep schedule  Avoid forcing sleep  Exercise regularly for at least 20 minutes, preferably 4 to 5 hours before bedtime  Avoid caffeinated beverages after lunch  Avoid alcohol near bedtime: no "night cap"  Avoid smoking, especially in the evening  Do not go to bed hungry  Adjust bedroom environment  Avoid prolonged use of light-emitting screens before bedtime   Deal with your worries before bedtime

## 2016-09-28 NOTE — Telephone Encounter (Signed)
Insurance re-verification initiated via [employer] portal. Awaiting determination.

## 2016-09-30 NOTE — Assessment & Plan Note (Signed)
Anxiety: Currently not taking clonazepam Insomnia: Chronic issue, good response to sporadic use of Ambien, new RX provided. Good habits discussed. Low back pain: On Ultram prescribed elsewhere, she takes a sporadic meloxicam, I issued a prescription, GI precautions discussed. Chronic fatigue: On B12 shots. Shot today. She has a number of other doctors here and in [place], chronic medical issues are handled elsewhere.

## 2016-10-02 NOTE — Telephone Encounter (Signed)
Tricia- can you order PROLIA for Pt? Thank you.  

## 2016-10-02 NOTE — Telephone Encounter (Signed)
Prolia has been ordered

## 2016-10-02 NOTE — Telephone Encounter (Signed)
Received Prolia benefits No PA required $183 deductible met Prolia and admin subject to 20% co-insurance- dual coverage should pick this up  Patient may owe approximately $0 out of pocket

## 2016-10-04 NOTE — Telephone Encounter (Signed)
Prolia has been received and is in fridge for pt.

## 2016-10-08 NOTE — Telephone Encounter (Signed)
Noted, thank you

## 2016-10-08 NOTE — Telephone Encounter (Signed)
Patient would like to speak with nurse regarding date discrepancy, patient states she's due the end of July beginning of August and would like to speak with nurse before scheduling,please advise

## 2016-10-08 NOTE — Telephone Encounter (Signed)
Patient scheduled for 12/18/2016 with nurse only.

## 2016-10-08 NOTE — Telephone Encounter (Signed)
Okay to schedule when she is due (Pt receives 2nd dose in [place] every year).

## 2016-10-08 NOTE — Telephone Encounter (Signed)
Please call Pt and schedule PROLIA injection to be completed end of June/ beginning of July, nurse visit only. Thank you.

## 2016-10-17 ENCOUNTER — Other Ambulatory Visit: Payer: Self-pay | Admitting: Internal Medicine

## 2016-10-17 NOTE — Telephone Encounter (Signed)
Advise patient, if she has frequent nausea, needs to talk with her GI doctor. Okay to refill Zofran from now, #30, no refills

## 2016-10-17 NOTE — Telephone Encounter (Signed)
Rx sent 

## 2016-10-17 NOTE — Telephone Encounter (Signed)
Pt is requesting refill on Zofran 4mg  tabs.   Last OV: 09/28/2016 Last Fill: 09/07/2016 #30 and 0RF (Pt sig: 1 tab q8h prn)  Okay to refill?

## 2016-10-29 ENCOUNTER — Telehealth: Payer: Self-pay | Admitting: Internal Medicine

## 2016-10-29 NOTE — Telephone Encounter (Signed)
Spoke with pt regarding awv. Pt stated that she is not sure if she has already received an awv in [place]. Informed pt that she will need to contact office in [place] to see if she has already completed wellness visit for this year. Pt stated that she will call [place] and if wellness visit has not been completed she will schedule appt when she comes in the office on 10/31/2016 for B12 injection.

## 2016-10-30 ENCOUNTER — Ambulatory Visit: Payer: Medicare Other

## 2016-10-31 ENCOUNTER — Ambulatory Visit (INDEPENDENT_AMBULATORY_CARE_PROVIDER_SITE_OTHER): Payer: Medicare Other | Admitting: Behavioral Health

## 2016-10-31 DIAGNOSIS — E538 Deficiency of other specified B group vitamins: Secondary | ICD-10-CM

## 2016-10-31 MED ORDER — CYANOCOBALAMIN 1000 MCG/ML IJ SOLN
1000.0000 ug | Freq: Once | INTRAMUSCULAR | Status: AC
  Administered 2016-10-31: 1000 ug via INTRAMUSCULAR

## 2016-10-31 NOTE — Progress Notes (Addendum)
Pre visit review using our clinic review tool, if applicable. No additional management support is needed unless otherwise documented below in the visit note.  Patient came in clinic for monthly B12 injection. IM injection was given in the left deltoid. Patient tolerated it well. Next appointment 12/04/16 at 2:45 PM.  Kathlene November, MD

## 2016-11-13 ENCOUNTER — Other Ambulatory Visit: Payer: Self-pay | Admitting: Internal Medicine

## 2016-11-19 ENCOUNTER — Other Ambulatory Visit: Payer: Self-pay | Admitting: Internal Medicine

## 2016-11-20 ENCOUNTER — Telehealth: Payer: Self-pay | Admitting: Internal Medicine

## 2016-11-20 MED ORDER — ONDANSETRON HCL 4 MG PO TABS: 4.0000 mg | ORAL_TABLET | Freq: Three times a day (TID) | ORAL | 1 refills | Status: DC | PRN

## 2016-11-20 NOTE — Telephone Encounter (Signed)
Pt called back to provide a better # to call back  [phone]

## 2016-11-20 NOTE — Telephone Encounter (Signed)
Patient calling again, states its urgent for someone to call her back

## 2016-11-20 NOTE — Telephone Encounter (Signed)
Spoke w/ Francee Piccolo, Pt's husband, informed that Rx has been sent. Further refills from Dr. Roney Mans.

## 2016-11-20 NOTE — Telephone Encounter (Signed)
Caller name:Sharon Armstrong Relationship to patient: Can be reached:[phone] Pharmacy:  Reason for call: patient is requesting to speak to assitant regarding denial of refill on ondansetron (ZOFRAN) 4 MG tablet

## 2016-11-20 NOTE — Telephone Encounter (Signed)
Spoke w/ Pt, informed that if she is using Zofran daily per PCP (see refill note 10/17/2016) that further refills will need to come from GI (Dr. Roney Mans at [facility]), Pt verbalized understanding and has follow-up appt with Dr. Roney Mans in August, Pt wanting to know if PCP will fill until then. Informed I'd send message and call back with recommendations.

## 2016-11-20 NOTE — Telephone Encounter (Signed)
Okay to refill until she sees her GI doctor, #30 and one refill

## 2016-12-04 ENCOUNTER — Ambulatory Visit (INDEPENDENT_AMBULATORY_CARE_PROVIDER_SITE_OTHER): Payer: Medicare Other | Admitting: Behavioral Health

## 2016-12-04 DIAGNOSIS — E538 Deficiency of other specified B group vitamins: Secondary | ICD-10-CM

## 2016-12-04 MED ORDER — CYANOCOBALAMIN 1000 MCG/ML IJ SOLN
1000.0000 ug | Freq: Once | INTRAMUSCULAR | Status: AC
  Administered 2016-12-04: 1000 ug via INTRAMUSCULAR

## 2016-12-04 NOTE — Progress Notes (Addendum)
Pre visit review using our clinic review tool, if applicable. No additional management support is needed unless otherwise documented below in the visit note.  Patient came in clinic for monthly B12 injection. IM injection was given in the left deltoid. Patient tolerated it well. Next appointment 01/08/17 at 2:45 PM.   Kathlene November, MD

## 2016-12-14 ENCOUNTER — Other Ambulatory Visit: Payer: Self-pay | Admitting: Endocrinology

## 2016-12-17 ENCOUNTER — Telehealth: Payer: Self-pay | Admitting: Endocrinology

## 2016-12-17 NOTE — Telephone Encounter (Signed)
She needs to discuss this with her gastroenterologist, nothing to do with her blood sugar

## 2016-12-17 NOTE — Telephone Encounter (Signed)
Patient called in reporting fruity breath when she lies down. This is the third time that she has noticed over recent weeks. Reported vomiting and stomach "issues" for the last week.   Sugar count is currently 105.  Please call patient to advise on mobile.

## 2016-12-17 NOTE — Telephone Encounter (Signed)
Called patient and she stated that she has not had any high blood sugar readings and she is currently not checking for ketones in her urine. She did state that she has been having some gastrointestinal issues and that she would reach out to her GI doctor asap. I advised her to do so quickly since she is having vomiting and stomach pains.

## 2016-12-18 ENCOUNTER — Ambulatory Visit (INDEPENDENT_AMBULATORY_CARE_PROVIDER_SITE_OTHER): Payer: Medicare Other

## 2016-12-18 DIAGNOSIS — M810 Age-related osteoporosis without current pathological fracture: Secondary | ICD-10-CM

## 2016-12-18 MED ORDER — DENOSUMAB 60 MG/ML ~~LOC~~ SOLN
60.0000 mg | Freq: Once | SUBCUTANEOUS | Status: AC
  Administered 2016-12-18: 60 mg via SUBCUTANEOUS

## 2016-12-18 NOTE — Progress Notes (Addendum)
Pre visit review using our clinic tool,if applicable. No additional management support is needed unless otherwise documented below in the visit note.   Patient in for Prolia injection per order from Dr. Kathlene November given 60 mg SQ left arm. Patient tolerated well.  Patient given reminder note which shows when her next injection is due. Patient states she will not receive hr next injection here because she will be out if town. States she will be back for th injection after next.   Kathlene November, MD

## 2016-12-27 ENCOUNTER — Encounter: Payer: Self-pay | Admitting: Endocrinology

## 2016-12-27 ENCOUNTER — Ambulatory Visit (INDEPENDENT_AMBULATORY_CARE_PROVIDER_SITE_OTHER): Payer: Medicare Other | Admitting: Endocrinology

## 2016-12-27 VITALS — BP 130/76 | HR 73 | Ht 64.0 in | Wt 154.0 lb

## 2016-12-27 DIAGNOSIS — E1165 Type 2 diabetes mellitus with hyperglycemia: Secondary | ICD-10-CM

## 2016-12-27 DIAGNOSIS — Z794 Long term (current) use of insulin: Secondary | ICD-10-CM

## 2016-12-27 LAB — POCT GLYCOSYLATED HEMOGLOBIN (HGB A1C): Hemoglobin A1C: 6.1

## 2016-12-27 NOTE — Patient Instructions (Addendum)
Check blood sugars on waking up    Also check blood sugars about 2 hours after a meal and do this after different meals by rotation  Recommended blood sugar levels on waking up is 90-130 and about 2 hours after meal is 130-160  Please bring your blood sugar monitor to each visit, thank you  Walk daily

## 2016-12-27 NOTE — Progress Notes (Signed)
Patient ID: Sharon Armstrong, female   DOB: 06-19-1940, 76 y.o.   MRN: [id number]           Reason for Appointment: f/u  for Type 2 Diabetes  Referring physician: Larose Kells  History of Present Illness:       Background history: She had been treated for several years with metformin and Amaryl  Her control had been fairly good until about 2012 and she thinks that because of poor control with oral agents she was given Lantus insulin in addition She has been on Lantus and metformin since then with variable control  Recent history:   INSULIN regimen is described as: 32 units of Lantus in am   Oral hypoglycemic drugs the patient is taking are: Metformin ER 1 g twice a day    Her A1c is better than usual at 6.1, previously had gone up to 7.1  Current blood sugar patterns, problems identified:  She has not followed up on the recommendation of starting the V-go pump on her last visit because of her higher A1c and postprandial hyperglycemia  However she thinks she is watching her diet much better and getting back on reach her foods, cutting back some on carbohydrates and eating more frequent smaller meals  She also has lost 4 pounds  Her monitor cannot be otherwise today because he does not have the correct date on it  Again appears to be checking blood sugars mostly in the mornings and only occasionally after meals  Review of readings recently show occasionally high readings after meals and she things only rarely it is over 200 after eating  FASTING blood sugars are fairly good  She has not changed her Lantus insulin Currently not exercising much with only some walking  She lives 6 months in [place] and has a physician there also      Side effects from medications have been: None  Compliance with the medical regimen: Good  Hypoglycemia:   none   Glucose monitoring:  done 1  times a day         Glucometer:  Accucheck  compact Blood Glucose readings by review of meter   Recent  fasting numbers: 92 122 111  Recent nonfasting blood sugars: 124 197 177 180 116  Self-care:                   Dinner at 6-7 pm  Diet consultation was in 6/16               Exercise: not walking regularly    Weight history:    Wt Readings from Last 3 Encounters:  12/27/16 154 lb (69.9 kg)  09/28/16 154 lb 8 oz (70.1 kg)  09/10/16 158 lb (71.7 kg)    Glycemic control:   Lab Results  Component Value Date   HGBA1C 6.1 12/27/2016   HGBA1C 7.1 (H) 09/07/2016   HGBA1C 5.9 02/03/2016   Lab Results  Component Value Date   MICROALBUR 3.1 (H) 09/07/2016   LDLCALC 91 09/07/2016   CREATININE 0.81 09/07/2016       Allergies as of 12/27/2016      Reactions   Gabapentin Swelling   Edema, legs    Iodine Hives   Iohexol    Nsaids Other (See Comments)   REACTION: hallucinations   Tolmetin Other (See Comments)   REACTION: hallucinations   Aciphex [rabeprazole] Rash   Ioxaglate Rash, Hives   Ivp Dye [iodinated Diagnostic Agents] Hives, Rash   Nexium [esomeprazole Magnesium]  Rash   Pepcid [famotidine] Rash   Protonix [pantoprazole Sodium] Rash      Medication List       Accurate as of 12/27/16 11:59 PM. Always use your most recent med list.          ACCU-CHEK COMPACT PLUS test strip Generic drug:  glucose blood CHECK BLOOD SUGARS NO MORE THAN TWICE DAILY DX E11.9   ASPERCREME LIDOCAINE 4 % Ptch Generic drug:  Lidocaine Apply topically.   carbidopa-levodopa 25-250 MG disintegrating tablet Commonly known as:  PARCOPA Take 1-2 tablets by mouth at bedtime.   Cyanocobalamin 1000 MCG/ML Kit Inject as directed.   LANTUS SOLOSTAR 100 UNIT/ML Solostar Pen Generic drug:  Insulin Glargine INJECT 38 UNITS INTO THE SKIN DAILY .   levothyroxine 100 MCG tablet Commonly known as:  SYNTHROID, LEVOTHROID Take 1 tablet (100 mcg total) by mouth daily.   lisinopril 10 MG tablet Commonly known as:  PRINIVIL,ZESTRIL Take 0.5 tablets (5 mg total) by mouth daily.   meloxicam  7.5 MG tablet Commonly known as:  MOBIC Take 1 tablet (7.5 mg total) by mouth daily as needed for pain.   metFORMIN 500 MG 24 hr tablet Commonly known as:  GLUCOPHAGE-XR TAKE 4 TABLETS (2,000 MG TOTAL) BY MOUTH DAILY WITH SUPPER.   metoCLOPramide 10 MG tablet Commonly known as:  REGLAN Take 10 mg by mouth.   milk thistle 175 MG tablet Take 175 mg by mouth daily.   ondansetron 4 MG tablet Commonly known as:  ZOFRAN Take 1 tablet (4 mg total) by mouth every 8 (eight) hours as needed for nausea or vomiting.   ONETOUCH DELICA LANCETS Misc Check once daily.   traMADol 50 MG tablet Commonly known as:  ULTRAM Take 2 tablets by mouth 2 (two) times daily.   triamterene-hydrochlorothiazide 75-50 MG tablet Commonly known as:  MAXZIDE Take 0.5 tablets by mouth daily.   zolpidem 10 MG tablet Commonly known as:  AMBIEN Take 0.5-1 tablets (5-10 mg total) by mouth at bedtime as needed for sleep.       Allergies:  Allergies  Allergen Reactions  . Gabapentin Swelling    Edema, legs   . Iodine Hives  . Iohexol   . Nsaids Other (See Comments)    REACTION: hallucinations  . Tolmetin Other (See Comments)    REACTION: hallucinations  . Aciphex [Rabeprazole] Rash  . Ioxaglate Rash and Hives  . Ivp Dye [Iodinated Diagnostic Agents] Hives and Rash  . Nexium [Esomeprazole Magnesium] Rash  . Pepcid [Famotidine] Rash  . Protonix [Pantoprazole Sodium] Rash    Past Medical History:  Diagnosis Date  . Adenomatous polyps 11/1998  . Anxiety   . Aortic valve insufficiency    mild  . Breast cancer ([place]) 04/2007   s/p XRT (last 4/09)  . Cervical myelopathy ([place])   . Cervical stenosis of spine    sees neuro surgery and dr.ramons s/p local injection 4/09  . Chronic constipation   . Chronic pancreatitis ([place]) 09/29/2014  . Colon polyps 02/2010   Tubular  Adenomas                                     . Diabetes mellitus   . Difficulty in urination    pt uses cath at times  .  Diverticulosis of colon   . Elevated liver function tests    fatty liver per CT 2009  . Fatty liver   .  Full body hives    if patient does not take Fexofenadine  . Gastroparesis 10/2015   gastric emptying abnormal for 4 hours  . GERD (gastroesophageal reflux disease)   . Hemorrhoids   . Hyperlipemia   . Hypertension   . Hypothyroidism   . Insomnia   . Lumbar spondylosis    Lumbar epidural steroid injection L5-S1 to the left, Dr. Nelva Bush  . Mitral valve regurgitation   . Osteoarthritis   . 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 Restless leg syndrome   . Urticaria 06/2009    Past Surgical History:  Procedure Laterality Date  . ANTERIOR CERVICAL DECOMP/DISCECTOMY FUSION N/A 01/08/2013   Procedure: Cervical three-four, four-five, five-six anterior cervical decompression with fusion plating and bonegraft;  Surgeon: Eustace Moore, MD;  Location: [facility];  Service: Neurosurgery;  Laterality: N/A;  Cervical three-four, four-five, five-six anterior cervical decompression with fusion plating and bonegraft  . APPENDECTOMY    . BREAST LUMPECTOMY Left 2009   left breast  . CARDIAC CATHETERIZATION  2001  . CATARACT EXTRACTION, BILATERAL  2/11  . EYE SURGERY    . GANGLION CYST EXCISION Right    wrist  . sinus cyst removed    . UTERINE FIBROID SURGERY      Family History  Problem Relation Age of Onset  . Hypertension Mother   . Lung cancer Mother        mets to brain  . Brain cancer Mother   . Tuberculosis Father        died at 19  . Coronary artery disease Neg Hx   . Diabetes Neg Hx   . Stroke Neg Hx   . Colon cancer Neg Hx   . Breast cancer Neg Hx     Social History:  reports that she has never smoked. She has never used smokeless tobacco. She reports that she drinks alcohol. She reports that she does not use drugs.    Review of Systems    Lipid history: Her levels are Adequately controlled Not on treatment    Lab Results  Component Value Date   CHOL 155  09/07/2016   HDL 39.40 09/07/2016   LDLCALC 91 09/07/2016   TRIG 123.0 09/07/2016   CHOLHDL 4 09/07/2016            Hypertension: She is on lisinopril 10 mg and Maxzide with good control  Gastrointestinal: History of fatty liver/?  Cirrhosis She was also told to have small bowel bacterial overgrowth in [place], going for follow-up to [place] soon  Has ?  gastroparesis with some nausea with vomiting and is on a special diet  Lab Results  Component Value Date   ALT 33 09/07/2016     Endocrine:  Long history of thyroid disease: Was told in her teen years that she was hypothyroid and this may have been a sequela to possible radiation treatment in childhood in [place]  TSH stable And is on 100 ug of Synthroid   Lab Results  Component Value Date   TSH 4.16 09/07/2016   TSH 3.58 09/07/2015   TSH 3.23 02/17/2015   FREET4 0.96 09/07/2016   FREET4 0.81 02/17/2015    Foot exam in 9/17 showed:  Decreased monofilament sensation on the toes and distal plantar surfaces of the right side  Physical Examination:  BP 130/76   Pulse 73   Ht 5' 4" (1.626 m)   Wt 154 lb (69.9 kg)   SpO2 97%  BMI 26.43 kg/m         ASSESSMENT/PLAN:   Diabetes type 2, uncontrolled    See history of present illness for detailed discussion of current diabetes management, blood sugar patterns and problems identified  Her A1c has come back down to 6.1 This is despite some postprandial hyperglycemia but she is not checking after meals regularly Also difficult to analyze her monitor patterns since he does not have the date programmed  She is mostly eating small meals and most of her high readings are from the type of carbohydrate she is getting For now she will continue on basal insulin and metformin alone She will get a new Accu-Chek guide meter that was given in the office and start using this Encouraged her to start walking regularly for exercise   HYPOTHYROID: Check labs on the next  visit     Patient Instructions  Check blood sugars on waking up    Also check blood sugars about 2 hours after a meal and do this after different meals by rotation  Recommended blood sugar levels on waking up is 90-130 and about 2 hours after meal is 130-160  Please bring your blood sugar monitor to each visit, thank you  Walk daily   [facility] 12/28/2016, 7:53 AM   Note: This office note was prepared with Dragon voice recognition system technology. Any transcriptional errors that result from this process are unintentional. `

## 2017-01-07 DIAGNOSIS — E1143 Type 2 diabetes mellitus with diabetic autonomic (poly)neuropathy: Secondary | ICD-10-CM | POA: Diagnosis not present

## 2017-01-07 DIAGNOSIS — K6389 Other specified diseases of intestine: Secondary | ICD-10-CM | POA: Diagnosis not present

## 2017-01-07 DIAGNOSIS — K3184 Gastroparesis: Secondary | ICD-10-CM | POA: Diagnosis not present

## 2017-01-07 DIAGNOSIS — K760 Fatty (change of) liver, not elsewhere classified: Secondary | ICD-10-CM | POA: Diagnosis not present

## 2017-01-07 DIAGNOSIS — K219 Gastro-esophageal reflux disease without esophagitis: Secondary | ICD-10-CM | POA: Diagnosis not present

## 2017-01-07 DIAGNOSIS — Z794 Long term (current) use of insulin: Secondary | ICD-10-CM | POA: Diagnosis not present

## 2017-01-08 ENCOUNTER — Ambulatory Visit: Payer: Medicare Other

## 2017-01-11 ENCOUNTER — Ambulatory Visit (INDEPENDENT_AMBULATORY_CARE_PROVIDER_SITE_OTHER): Payer: Medicare Other | Admitting: Behavioral Health

## 2017-01-11 DIAGNOSIS — E538 Deficiency of other specified B group vitamins: Secondary | ICD-10-CM

## 2017-01-11 MED ORDER — CYANOCOBALAMIN 1000 MCG/ML IJ SOLN
1000.0000 ug | Freq: Once | INTRAMUSCULAR | Status: AC
  Administered 2017-01-11: 1000 ug via INTRAMUSCULAR

## 2017-01-11 NOTE — Progress Notes (Signed)
Pre visit review using our clinic review tool, if applicable. No additional management support is needed unless otherwise documented below in the visit note.  Patient came in clinic for monthly B12 injection. IM injection was given in the left deltoid. Patient tolerated it well. Next appointment 02/12/17 at 2:00 PM.

## 2017-01-28 DIAGNOSIS — Z1289 Encounter for screening for malignant neoplasm of other sites: Secondary | ICD-10-CM | POA: Diagnosis not present

## 2017-01-28 DIAGNOSIS — K760 Fatty (change of) liver, not elsewhere classified: Secondary | ICD-10-CM | POA: Diagnosis not present

## 2017-02-07 ENCOUNTER — Other Ambulatory Visit: Payer: Self-pay

## 2017-02-07 ENCOUNTER — Telehealth: Payer: Self-pay | Admitting: Endocrinology

## 2017-02-07 DIAGNOSIS — Z23 Encounter for immunization: Secondary | ICD-10-CM | POA: Diagnosis not present

## 2017-02-07 DIAGNOSIS — R300 Dysuria: Secondary | ICD-10-CM | POA: Diagnosis not present

## 2017-02-07 MED ORDER — GLUCOSE BLOOD VI STRP: ORAL_STRIP | 4 refills | Status: DC

## 2017-02-07 MED ORDER — INSULIN PEN NEEDLE 31G X 6 MM MISC: 3 refills | Status: DC

## 2017-02-07 NOTE — Telephone Encounter (Signed)
MEDICATION:  ACCU-CHEK COMPACT PLUS test strip [[id number]]  (also needs needles)  Pen Needles, 62mm, 31G     PHARMACY:   [id number] [place], [place] - [address] [phone] (Phone) [phone] (Fax)     IS THIS A 90 DAY SUPPLY :  Yes  IS PATIENT OUT OF MEDICATION:  No  IF NOT; HOW MUCH IS LEFT:  Very few  LAST APPOINTMENT DATE: @7 /30/2018  NEXT APPOINTMENT DATE:@Visit  date not found  OTHER COMMENTS:    **Let patient know to contact pharmacy at the end of the day to make sure medication is ready. **  ** Please notify patient to allow 48-72 hours to process**  **Encourage patient to contact the pharmacy for refills or they can request refills through [facility]**

## 2017-02-07 NOTE — Telephone Encounter (Signed)
This has been ordered 

## 2017-02-08 NOTE — Telephone Encounter (Signed)
MEDICATION: Accu chek guide test strips and accu checl fast click needles  PHARMACY:   [id number] [place], [place] - [address] [phone] (Phone) [phone] (Fax)     IS THIS A 90 DAY SUPPLY :  Y  IS PATIENT OUT OF MEDICATION:   IF NOT; HOW MUCH IS LEFT:   LAST APPOINTMENT DATE:   NEXT APPOINTMENT DATE:  OTHER COMMENTS: Original Rx refill was wrong   **Let patient know to contact pharmacy at the end of the day to make sure medication is ready. **  ** Please notify patient to allow 48-72 hours to process**  **Encourage patient to contact the pharmacy for refills or they can request refills through [facility]**

## 2017-02-12 ENCOUNTER — Ambulatory Visit: Payer: Medicare Other

## 2017-02-15 ENCOUNTER — Ambulatory Visit: Payer: Medicare Other

## 2017-02-20 ENCOUNTER — Ambulatory Visit (INDEPENDENT_AMBULATORY_CARE_PROVIDER_SITE_OTHER): Payer: Medicare Other | Admitting: Behavioral Health

## 2017-02-20 DIAGNOSIS — E538 Deficiency of other specified B group vitamins: Secondary | ICD-10-CM

## 2017-02-20 MED ORDER — CYANOCOBALAMIN 1000 MCG/ML IJ SOLN
1000.0000 ug | Freq: Once | INTRAMUSCULAR | Status: AC
  Administered 2017-02-20: 1000 ug via INTRAMUSCULAR

## 2017-02-20 NOTE — Telephone Encounter (Signed)
Patient stated she still has not received Rx noted below. Please advise.

## 2017-02-20 NOTE — Progress Notes (Addendum)
Pre visit review using our clinic review tool, if applicable. No additional management support is needed unless otherwise documented below in the visit note.  Patient came in clinic for monthly B12 injection. IM injection was given in the left deltoid. Patient tolerated it well. She will call the office at a later date to schedule the next appointment.  Kathlene November, MD

## 2017-02-25 ENCOUNTER — Telehealth: Payer: Self-pay | Admitting: Endocrinology

## 2017-02-25 ENCOUNTER — Other Ambulatory Visit: Payer: Self-pay

## 2017-02-25 MED ORDER — ACCU-CHEK FASTCLIX LANCETS MISC: 2 refills | Status: DC

## 2017-02-25 MED ORDER — GLUCOSE BLOOD VI STRP: ORAL_STRIP | 4 refills | Status: DC

## 2017-02-25 NOTE — Telephone Encounter (Signed)
Left vm requesting patient call me back to discuss the note below

## 2017-02-25 NOTE — Telephone Encounter (Signed)
Caller Name:Kynzlie Ashritha [place] Number: [phone]  [facility]  Reason for call:  PT got an [place]  But does have the all the irtems she needs.   Patient states she has called multilple times and the wrong needles and test strips were went to the pharmacy for her old test meter.  Please call the patient directly before sending in items to [place].  [phone]  Canceled upcoming appointment per patient request, she has not been able to try new meter.

## 2017-02-27 ENCOUNTER — Ambulatory Visit: Payer: Medicare Other | Admitting: Endocrinology

## 2017-02-27 ENCOUNTER — Ambulatory Visit: Payer: Medicare Other

## 2017-02-28 DIAGNOSIS — Z79891 Long term (current) use of opiate analgesic: Secondary | ICD-10-CM | POA: Diagnosis not present

## 2017-02-28 DIAGNOSIS — Z853 Personal history of malignant neoplasm of breast: Secondary | ICD-10-CM | POA: Diagnosis not present

## 2017-02-28 DIAGNOSIS — I10 Essential (primary) hypertension: Secondary | ICD-10-CM | POA: Diagnosis not present

## 2017-02-28 DIAGNOSIS — E039 Hypothyroidism, unspecified: Secondary | ICD-10-CM | POA: Diagnosis not present

## 2017-02-28 DIAGNOSIS — Z13818 Encounter for screening for other digestive system disorders: Secondary | ICD-10-CM | POA: Diagnosis not present

## 2017-02-28 DIAGNOSIS — Z1389 Encounter for screening for other disorder: Secondary | ICD-10-CM | POA: Diagnosis not present

## 2017-02-28 DIAGNOSIS — Z79899 Other long term (current) drug therapy: Secondary | ICD-10-CM | POA: Diagnosis not present

## 2017-02-28 DIAGNOSIS — K760 Fatty (change of) liver, not elsewhere classified: Secondary | ICD-10-CM | POA: Diagnosis not present

## 2017-02-28 DIAGNOSIS — K3184 Gastroparesis: Secondary | ICD-10-CM | POA: Diagnosis not present

## 2017-02-28 DIAGNOSIS — Z888 Allergy status to other drugs, medicaments and biological substances status: Secondary | ICD-10-CM | POA: Diagnosis not present

## 2017-02-28 DIAGNOSIS — E1143 Type 2 diabetes mellitus with diabetic autonomic (poly)neuropathy: Secondary | ICD-10-CM | POA: Diagnosis not present

## 2017-02-28 DIAGNOSIS — Z79890 Hormone replacement therapy (postmenopausal): Secondary | ICD-10-CM | POA: Diagnosis not present

## 2017-02-28 DIAGNOSIS — K219 Gastro-esophageal reflux disease without esophagitis: Secondary | ICD-10-CM | POA: Diagnosis not present

## 2017-02-28 DIAGNOSIS — R011 Cardiac murmur, unspecified: Secondary | ICD-10-CM | POA: Diagnosis not present

## 2017-02-28 DIAGNOSIS — Z886 Allergy status to analgesic agent status: Secondary | ICD-10-CM | POA: Diagnosis not present

## 2017-03-04 ENCOUNTER — Other Ambulatory Visit: Payer: Self-pay | Admitting: Internal Medicine

## 2017-03-04 NOTE — Telephone Encounter (Signed)
Rx faxed to [place], [place].

## 2017-03-04 NOTE — Telephone Encounter (Signed)
Okay #30 and one refill 

## 2017-03-04 NOTE — Telephone Encounter (Signed)
Pt is requesting refill on Ambien 10mg .   Last OV: 09/28/2016  Last Fill: 09/28/2016 #30 and 1RF UDS: Not needed for Ambien per PCP  NCCR printed;   Tramadol 50mg  prescribed regularly by Dr. Nelva Bush

## 2017-03-04 NOTE — Telephone Encounter (Signed)
Rx printed, awaiting MD signature.  

## 2017-03-07 ENCOUNTER — Telehealth: Payer: Self-pay | Admitting: Internal Medicine

## 2017-03-07 NOTE — Telephone Encounter (Signed)
Noted! Thank you

## 2017-03-07 NOTE — Telephone Encounter (Signed)
Caller name:Guadamuz,Roger L  Relation to pt: spouse Call back number:[phone] [id number] [place] [place] [phone] (Phone) [phone] (Fax)     Reason for call:  Spouse requesting a refill zolpidem (AMBIEN) 10 MG tablet. LVM advising spouse script was sent to pharmacy 03/04/17 with 1 refill

## 2017-03-12 DIAGNOSIS — M5136 Other intervertebral disc degeneration, lumbar region: Secondary | ICD-10-CM | POA: Diagnosis not present

## 2017-03-19 NOTE — Telephone Encounter (Signed)
Called patient and left a voice message to see what she needed to go with her meter and what meter she is using. Hopefully she will call back to let us know the specific items that she needs.

## 2017-03-25 DIAGNOSIS — E538 Deficiency of other specified B group vitamins: Secondary | ICD-10-CM | POA: Diagnosis not present

## 2017-03-26 ENCOUNTER — Other Ambulatory Visit: Payer: Self-pay

## 2017-03-26 ENCOUNTER — Other Ambulatory Visit: Payer: Self-pay | Admitting: *Deleted

## 2017-03-26 MED ORDER — METFORMIN HCL ER 500 MG PO TB24: 2000.0000 mg | ORAL_TABLET | Freq: Every day | ORAL | 0 refills | Status: DC

## 2017-03-26 NOTE — Telephone Encounter (Signed)
Patient called and states she is out for town and needs a refill of her Metformin. Patient needs a 90 day supply  Patient states [facility]  [phone].) Please advise. Thank you

## 2017-03-26 NOTE — Telephone Encounter (Signed)
Forwarding to Megan

## 2017-03-26 NOTE — Telephone Encounter (Signed)
Called patient and left a voice message to let her know that I have sent over a refill for her Metformin to the pharmacy in [place].

## 2017-03-29 ENCOUNTER — Other Ambulatory Visit: Payer: Self-pay

## 2017-04-03 DIAGNOSIS — K7460 Unspecified cirrhosis of liver: Secondary | ICD-10-CM | POA: Diagnosis not present

## 2017-04-03 DIAGNOSIS — K3184 Gastroparesis: Secondary | ICD-10-CM | POA: Diagnosis not present

## 2017-04-16 ENCOUNTER — Other Ambulatory Visit: Payer: Self-pay

## 2017-04-16 MED ORDER — INSULIN GLARGINE 100 UNIT/ML SOLOSTAR PEN: PEN_INJECTOR | SUBCUTANEOUS | 3 refills | Status: DC

## 2017-04-17 DIAGNOSIS — I350 Nonrheumatic aortic (valve) stenosis: Secondary | ICD-10-CM | POA: Diagnosis not present

## 2017-04-17 DIAGNOSIS — I6523 Occlusion and stenosis of bilateral carotid arteries: Secondary | ICD-10-CM | POA: Diagnosis not present

## 2017-04-17 DIAGNOSIS — I6522 Occlusion and stenosis of left carotid artery: Secondary | ICD-10-CM | POA: Diagnosis not present

## 2017-04-17 DIAGNOSIS — I6529 Occlusion and stenosis of unspecified carotid artery: Secondary | ICD-10-CM | POA: Diagnosis not present

## 2017-04-17 DIAGNOSIS — I6521 Occlusion and stenosis of right carotid artery: Secondary | ICD-10-CM | POA: Diagnosis not present

## 2017-05-07 ENCOUNTER — Other Ambulatory Visit: Payer: Self-pay

## 2017-05-07 MED ORDER — LISINOPRIL 10 MG PO TABS: 5.0000 mg | ORAL_TABLET | Freq: Every day | ORAL | 1 refills | Status: DC

## 2017-05-08 DIAGNOSIS — E119 Type 2 diabetes mellitus without complications: Secondary | ICD-10-CM | POA: Diagnosis not present

## 2017-05-08 DIAGNOSIS — G629 Polyneuropathy, unspecified: Secondary | ICD-10-CM | POA: Diagnosis not present

## 2017-05-08 DIAGNOSIS — G2581 Restless legs syndrome: Secondary | ICD-10-CM | POA: Diagnosis not present

## 2017-06-12 ENCOUNTER — Telehealth: Payer: Self-pay | Admitting: Internal Medicine

## 2017-06-12 NOTE — Telephone Encounter (Signed)
Prolia benefits received °PA not required °$185 ded ($0 met) °20% co-insurance °Both covered by supplement 100% ° ° °Patient may owe approximately $0 OOP ° °Patient due after 06/16/17 ° °Letter mailed to inform patient of benefits and to schedule ° °

## 2017-06-12 NOTE — Telephone Encounter (Signed)
This one is received in [place]. She receives 2nd dosage of year here in [place].

## 2017-06-22 ENCOUNTER — Other Ambulatory Visit: Payer: Self-pay | Admitting: Endocrinology

## 2017-07-05 ENCOUNTER — Telehealth: Payer: Self-pay | Admitting: Endocrinology

## 2017-07-05 MED ORDER — INSULIN ASPART 100 UNIT/ML FLEXPEN: PEN_INJECTOR | SUBCUTANEOUS | 1 refills | Status: DC

## 2017-07-05 NOTE — Telephone Encounter (Signed)
Pt stated she went to have an epidural done before epidural was done blood sugar was 284. Her Blood sugar has stayed around the 200-300. She is getting ready to go on a cruise Sunday and would like to know what she could do to get those down.     Please advise

## 2017-07-05 NOTE — Telephone Encounter (Signed)
Please advise on below  

## 2017-07-05 NOTE — Telephone Encounter (Signed)
Med sent and LMTCB

## 2017-07-05 NOTE — Telephone Encounter (Signed)
She will need to increase her Lantus by 8 units and also start NovoLog 6-8 units before every meal when blood sugar over 200.  Please send prescription for NovoLog FlexPen

## 2017-07-08 NOTE — Telephone Encounter (Signed)
Called pt again and was unable to LVM

## 2017-08-16 LAB — HM MAMMOGRAPHY

## 2017-09-04 ENCOUNTER — Ambulatory Visit: Payer: Medicare Other | Admitting: Endocrinology

## 2017-09-19 ENCOUNTER — Other Ambulatory Visit: Payer: Self-pay | Admitting: Endocrinology

## 2017-09-21 ENCOUNTER — Other Ambulatory Visit: Payer: Self-pay | Admitting: Endocrinology

## 2017-09-25 ENCOUNTER — Other Ambulatory Visit: Payer: Self-pay | Admitting: Endocrinology

## 2017-09-26 ENCOUNTER — Telehealth: Payer: Self-pay | Admitting: Endocrinology

## 2017-09-26 NOTE — Telephone Encounter (Signed)
Patient stated that pharmacy have not received her medication metformin Patient ask if you could send it by the weekend.  [id number] [place], [place] - [address] [phone] (Phone) [phone] (Fax)

## 2017-10-11 ENCOUNTER — Other Ambulatory Visit: Payer: Self-pay | Admitting: Endocrinology

## 2017-10-26 ENCOUNTER — Other Ambulatory Visit: Payer: Self-pay | Admitting: Endocrinology

## 2017-11-04 ENCOUNTER — Other Ambulatory Visit: Payer: Self-pay | Admitting: Endocrinology

## 2017-11-05 ENCOUNTER — Telehealth: Payer: Self-pay | Admitting: Endocrinology

## 2017-11-05 NOTE — Telephone Encounter (Signed)
Patient need a refill of her Lantus Send to  [place], [place] [address]  p# [phone] Fax # [phone]

## 2017-11-05 NOTE — Telephone Encounter (Signed)
Called pt and left detailed voicemail explaining that refill cannot be sent to the pharmacy for her because she has not been seen in the office since August of 2018 and at this time, she does not have an existing appointment. She was also informed that she would need to call the office and schedule an appointment for any future refills. Pt urged to call office back if she should have any questions.

## 2017-11-07 ENCOUNTER — Telehealth: Payer: Self-pay | Admitting: Internal Medicine

## 2017-11-07 ENCOUNTER — Other Ambulatory Visit: Payer: Self-pay

## 2017-11-07 MED ORDER — INSULIN GLARGINE 100 UNIT/ML SOLOSTAR PEN: PEN_INJECTOR | SUBCUTANEOUS | 3 refills | Status: DC

## 2017-11-07 NOTE — Telephone Encounter (Signed)
Prolia benefits received PA not required $185 deductible-met 20% Prolia covered by secondary   Patient may owe approximately $0 OOP   Letter mailed to inform patient of benefits and to schedule

## 2017-11-13 LAB — HM DIABETES EYE EXAM

## 2017-11-26 ENCOUNTER — Ambulatory Visit (INDEPENDENT_AMBULATORY_CARE_PROVIDER_SITE_OTHER): Payer: Medicare Other

## 2017-11-26 DIAGNOSIS — E538 Deficiency of other specified B group vitamins: Secondary | ICD-10-CM | POA: Diagnosis not present

## 2017-11-26 MED ORDER — CYANOCOBALAMIN 1000 MCG/ML IJ SOLN
1000.0000 ug | Freq: Once | INTRAMUSCULAR | Status: AC
  Administered 2017-11-26: 1000 ug via INTRAMUSCULAR

## 2017-11-26 NOTE — Progress Notes (Signed)
Pt here for monthly B12 injection per Dr. Larose Kells  B12 1056mcg given IM Left arm, and pt tolerated injection well.  Next B12 injection scheduled for 12/27/2017 @4pm 

## 2017-12-10 ENCOUNTER — Other Ambulatory Visit: Payer: Self-pay | Admitting: Internal Medicine

## 2017-12-17 ENCOUNTER — Other Ambulatory Visit: Payer: Self-pay | Admitting: Internal Medicine

## 2017-12-17 ENCOUNTER — Telehealth: Payer: Self-pay

## 2017-12-17 MED ORDER — LISINOPRIL 10 MG PO TABS: 5.0000 mg | ORAL_TABLET | Freq: Every day | ORAL | 1 refills | Status: DC

## 2017-12-17 NOTE — Telephone Encounter (Signed)
Please call Pt and schedule nurse visit for Prolia. Thank you.

## 2017-12-17 NOTE — Telephone Encounter (Signed)
Called pt and informed her that her request for the Prolia shot was approved. She already had an appt on 12/27/17 for a B12 inj so I added Prolia to the appt notes.

## 2017-12-17 NOTE — Telephone Encounter (Signed)
Copied from [place] [phone]. Topic: Quick Communication - Rx Refill/Question >> Dec 17, 2017 11:24 AM Judyann Munson wrote: Medication: lisinopril (PRINIVIL,ZESTRIL) 10 MG tablet  Has the patient contacted their pharmacy? no  Preferred Pharmacy (with phone number or street name): [id number] [place] [place] [phone] (Phone) [phone] (Fax)    Patient is requesting a 90 day supply. Please advise

## 2017-12-17 NOTE — Telephone Encounter (Signed)
Rx sent 

## 2017-12-17 NOTE — Telephone Encounter (Signed)
Copied from [place] [id number]. Topic: General - Other >> Dec 17, 2017 11:22 AM Judyann Munson wrote: Reason for CRM:  Patient is requesting a  injection of Prolia. Please advise

## 2017-12-23 ENCOUNTER — Encounter: Payer: Self-pay | Admitting: Endocrinology

## 2017-12-23 ENCOUNTER — Other Ambulatory Visit (INDEPENDENT_AMBULATORY_CARE_PROVIDER_SITE_OTHER): Payer: Medicare Other

## 2017-12-23 ENCOUNTER — Ambulatory Visit (INDEPENDENT_AMBULATORY_CARE_PROVIDER_SITE_OTHER): Payer: Medicare Other | Admitting: Endocrinology

## 2017-12-23 VITALS — BP 126/72 | HR 81 | Ht 64.0 in | Wt 152.4 lb

## 2017-12-23 DIAGNOSIS — E063 Autoimmune thyroiditis: Secondary | ICD-10-CM | POA: Diagnosis not present

## 2017-12-23 DIAGNOSIS — E1165 Type 2 diabetes mellitus with hyperglycemia: Secondary | ICD-10-CM

## 2017-12-23 DIAGNOSIS — Z794 Long term (current) use of insulin: Secondary | ICD-10-CM

## 2017-12-23 DIAGNOSIS — E782 Mixed hyperlipidemia: Secondary | ICD-10-CM | POA: Diagnosis not present

## 2017-12-23 DIAGNOSIS — IMO0002 Reserved for concepts with insufficient information to code with codable children: Secondary | ICD-10-CM

## 2017-12-23 DIAGNOSIS — E1142 Type 2 diabetes mellitus with diabetic polyneuropathy: Secondary | ICD-10-CM

## 2017-12-23 LAB — POCT GLYCOSYLATED HEMOGLOBIN (HGB A1C): HEMOGLOBIN A1C: 6.5 % — AB (ref 4.0–5.6)

## 2017-12-23 LAB — LIPID PANEL
CHOL/HDL RATIO: 4
Cholesterol: 179 mg/dL (ref 0–200)
HDL: 40.5 mg/dL (ref >39.00)
LDL Cholesterol: 101 mg/dL — ABNORMAL HIGH (ref 0–99)
NonHDL: 138.16
Triglycerides: 185 mg/dL — ABNORMAL HIGH (ref 0.0–149.0)
VLDL: 37 mg/dL (ref 0.0–40.0)

## 2017-12-23 LAB — T4, FREE: FREE T4: 1.13 ng/dL (ref 0.60–1.60)

## 2017-12-23 NOTE — Progress Notes (Signed)
Patient ID: Sharon Armstrong, female   DOB: May 24, 1940, 77 y.o.   MRN: [id number]           Reason for Appointment: f/u  for Type 2 Diabetes  Referring physician: Larose Kells  History of Present Illness:       Background history: She had been treated for several years with metformin and Amaryl  Her control had been fairly good until about 2012 and she thinks that because of poor control with oral agents she was given Lantus insulin in addition She has been on Lantus and metformin since then with variable control  Recent history:   INSULIN regimen is described as: 37 units of Lantus in am    Oral hypoglycemic drugs the patient is taking are: Metformin ER 1 g twice a day  Her A1c is 6.5, previous range 6.1-7.1  Current blood sugar patterns, problems identified:  She has not followed up since her visit about a year ago  She did guard in February and had very high blood sugars near 300 also after steroid injection and was told to take NovoLog  She has not taken any recently  However she does have sporadic high readings after meals when she is eating more carbohydrate or desserts  She says because of her possible gastroparesis she is eating only 5 small meals a day  Her weight is about the same  She does not check readings after meals very much and mostly late morning when she wakes up On her last visit she was taking 32 units of Lantus but she has gone up on her own dose because of increasing blood sugar She takes her metformin regularly  She lives 6 months in [place] and has a physician there also      Side effects from medications have been: None  Compliance with the medical regimen: Good  Hypoglycemia:   none   Glucose monitoring:  done about 1  times a day         Glucometer:  Accucheck    Blood Glucose readings by review of meter download:   Mean values apply above for all meters except median for One Touch  PRE-MEAL Fasting Lunch Dinner Bedtime Overall  Glucose range:   95-158   100   90-197  Mean/median:  118     126   POST-MEAL PC Breakfast PC Lunch PC Dinner  Glucose range:   112-197  177  Mean/median:       Self-care:            Eating small meals  Diet consultation was in 6/16               Exercise: not walking currently  Weight history:    Wt Readings from Last 3 Encounters:  12/23/17 152 lb 6.4 oz (69.1 kg)  12/27/16 154 lb (69.9 kg)  09/28/16 154 lb 8 oz (70.1 kg)    Glycemic control:   Lab Results  Component Value Date   HGBA1C 6.5 (A) 12/23/2017   HGBA1C 6.1 12/27/2016   HGBA1C 7.1 (H) 09/07/2016   Lab Results  Component Value Date   MICROALBUR 3.1 (H) 09/07/2016   LDLCALC 91 09/07/2016   CREATININE 0.81 09/07/2016       Allergies as of 12/23/2017      Reactions   Gabapentin Swelling   Edema, legs    Iodine Hives   Iohexol    Nsaids Other (See Comments)   REACTION: hallucinations   Tolmetin Other (See Comments)  REACTION: hallucinations   Aciphex [rabeprazole] Rash   Ioxaglate Rash, Hives   Ivp Dye [iodinated Diagnostic Agents] Hives, Rash   Nexium [esomeprazole Magnesium] Rash   Pepcid [famotidine] Rash   Protonix [pantoprazole Sodium] Rash      Medication List        Accurate as of 12/23/17  2:29 PM. Always use your most recent med list.          ASPERCREME LIDOCAINE 4 % Ptch Generic drug:  Lidocaine Apply topically.   carbidopa-levodopa 25-250 MG disintegrating tablet Commonly known as:  PARCOPA Take 1-2 tablets by mouth at bedtime.   Cyanocobalamin 1000 MCG/ML Kit Inject as directed.   glucose blood test strip Commonly known as:  ACCU-CHEK COMPACT PLUS CHECK BLOOD SUGARS NO MORE THAN TWICE DAILY DX E11.9   glucose blood test strip Commonly known as:  ACCU-CHEK GUIDE Use to test blood sugar twice daily- Dx code E11.9   insulin aspart 100 UNIT/ML FlexPen Commonly known as:  NOVOLOG FLEXPEN 6-8 units before meals when sugars are over 200   Insulin Glargine 100 UNIT/ML Solostar  Pen Commonly known as:  LANTUS SOLOSTAR INJECT 38 UNITS INTO THE SKIN DAILY .   Insulin Pen Needle 31G X 6 MM Misc Use to inject insulin   levothyroxine 100 MCG tablet Commonly known as:  SYNTHROID, LEVOTHROID Take 1 tablet (100 mcg total) by mouth daily.   lisinopril 10 MG tablet Commonly known as:  PRINIVIL,ZESTRIL Take 0.5 tablets (5 mg total) by mouth daily.   meloxicam 7.5 MG tablet Commonly known as:  MOBIC Take 1 tablet (7.5 mg total) by mouth daily as needed for pain.   metFORMIN 500 MG 24 hr tablet Commonly known as:  GLUCOPHAGE-XR TAKE 4 TABLETS (2,000 MG TOTAL) DAILY WITH SUPPER BY MOUTH.   metoCLOPramide 10 MG tablet Commonly known as:  REGLAN Take 10 mg by mouth.   milk thistle 175 MG tablet Take 175 mg by mouth daily.   ondansetron 4 MG tablet Commonly known as:  ZOFRAN Take 1 tablet (4 mg total) by mouth every 8 (eight) hours as needed for nausea or vomiting.   ONETOUCH DELICA LANCETS Misc Check once daily.   ACCU-CHEK FASTCLIX LANCETS Misc Use to test blood twice daily- Dx code E11.9   traMADol 50 MG tablet Commonly known as:  ULTRAM Take 2 tablets by mouth 2 (two) times daily.   triamterene-hydrochlorothiazide 75-50 MG tablet Commonly known as:  MAXZIDE Take 0.5 tablets by mouth daily.   zolpidem 10 MG tablet Commonly known as:  AMBIEN Take 0.5-1 tablets (5-10 mg total) by mouth at bedtime as needed for sleep.       Allergies:  Allergies  Allergen Reactions  . Gabapentin Swelling    Edema, legs   . Iodine Hives  . Iohexol   . Nsaids Other (See Comments)    REACTION: hallucinations  . Tolmetin Other (See Comments)    REACTION: hallucinations  . Aciphex [Rabeprazole] Rash  . Ioxaglate Rash and Hives  . Ivp Dye [Iodinated Diagnostic Agents] Hives and Rash  . Nexium [Esomeprazole Magnesium] Rash  . Pepcid [Famotidine] Rash  . Protonix [Pantoprazole Sodium] Rash    Past Medical History:  Diagnosis Date  . Adenomatous polyps  11/1998  . Anxiety   . Aortic valve insufficiency    mild  . Breast cancer ([facility]) 04/2007   s/p XRT (last 4/09)  . Cervical myelopathy ([facility])   . Cervical stenosis of spine    sees neuro surgery and  dr.ramons s/p local injection 4/09  . Chronic constipation   . Chronic pancreatitis ([facility]) 09/29/2014  . Colon polyps 02/2010   Tubular  Adenomas                                     . Diabetes mellitus   . Difficulty in urination    pt uses cath at times  . Diverticulosis of colon   . Elevated liver function tests    fatty liver per CT 2009  . Fatty liver   . Full body hives    if patient does not take Fexofenadine  . Gastroparesis 10/2015   gastric emptying abnormal for 4 hours  . GERD (gastroesophageal reflux disease)   . Hemorrhoids   . Hyperlipemia   . Hypertension   . Hypothyroidism   . Insomnia   . Lumbar spondylosis    Lumbar epidural steroid injection L5-S1 to the left, Dr. Nelva Bush  . Mitral valve regurgitation   . Osteoarthritis   . 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 Restless leg syndrome   . Urticaria 06/2009    Past Surgical History:  Procedure Laterality Date  . ANTERIOR CERVICAL DECOMP/DISCECTOMY FUSION N/A 01/08/2013   Procedure: Cervical three-four, four-five, five-six anterior cervical decompression with fusion plating and bonegraft;  Surgeon: Eustace Moore, MD;  Location: [facility];  Service: Neurosurgery;  Laterality: N/A;  Cervical three-four, four-five, five-six anterior cervical decompression with fusion plating and bonegraft  . APPENDECTOMY    . BREAST LUMPECTOMY Left 2009   left breast  . CARDIAC CATHETERIZATION  2001  . CATARACT EXTRACTION, BILATERAL  2/11  . EYE SURGERY    . GANGLION CYST EXCISION Right    wrist  . sinus cyst removed    . UTERINE FIBROID SURGERY      Family History  Problem Relation Age of Onset  . Hypertension Mother   . Lung cancer Mother        mets to brain  . Brain cancer Mother   . Tuberculosis Father         died at 72  . Coronary artery disease Neg Hx   . Diabetes Neg Hx   . Stroke Neg Hx   . Colon cancer Neg Hx   . Breast cancer Neg Hx     Social History:  reports that she has never smoked. She has never used smokeless tobacco. She reports that she drinks alcohol. She reports that she does not use drugs.    Review of Systems    Lipid history: Usually has LDL below 100 Not on treatment    Lab Results  Component Value Date   CHOL 155 09/07/2016   HDL 39.40 09/07/2016   LDLCALC 91 09/07/2016   TRIG 123.0 09/07/2016   CHOLHDL 4 09/07/2016            Hypertension: She is on lisinopril 10 mg and same dose of Maxzide prescribed by her PCP with good control  Gastrointestinal: History of  Cirrhosis followed by gastroenterologist regularly   Has ?  gastroparesis with history of nausea with vomiting and is on a special diet, recently told to try not taking metoclopramide  Lab Results  Component Value Date   ALT 33 09/07/2016     Endocrine:  Long history of thyroid disease: Was told in her teen years that she was hypothyroid and this may have  been a sequela to possible radiation treatment in childhood in [place]   Feels fairly good overall but tends to have fatigue chronically, taking 100 ug of Synthroid She had her thyroid level checked in the first part of the year but no labs available from PCP   Lab Results  Component Value Date   TSH 4.16 09/07/2016   TSH 3.58 09/07/2015   TSH 3.23 02/17/2015   FREET4 0.96 09/07/2016   FREET4 0.81 02/17/2015    Foot exam in 12/2017 showed:  Decreased monofilament sensation on the toes and distal plantar surfaces of the right side  Physical Examination:  BP 126/72 (BP Location: Left Arm, Patient Position: Sitting, Cuff Size: Normal)   Pulse 81   Ht '5\' 4"'$  (1.626 m)   Wt 152 lb 6.4 oz (69.1 kg)   SpO2 97%   BMI 26.16 kg/m    Diabetic Foot Exam - Simple   Simple Foot Form Diabetic Foot exam was performed with the following  findings:  Yes   Visual Inspection No deformities, no ulcerations, no other skin breakdown bilaterally:  Yes Sensation Testing See comments:  Yes Pulse Check Posterior Tibialis and Dorsalis pulse intact bilaterally:  Yes Comments Has loss of monofilament sensation in some toes distally, has decreased sensation in other toes    No ankle edema    ASSESSMENT/PLAN:   Diabetes type 2, uncontrolled    See history of present illness for detailed discussion of current diabetes management, blood sugar patterns and problems identified  Her A1c has been reasonably good at 6.5 Fasting readings are fairly good overall and averaging under 120 She does have periodic high readings after meals Since she eats only small meals usually does not need mealtime insulin and appears to be doing well with metformin only has the oral agent Highest reading 197 She is asking more difficulties with her current Accu-Chek guide meter and needing replacement batteries very often  She will get a new Accu-Chek guide me meter OTC and she can use the same test strips  No change in Lantus Discussed that she can take 3 to 5 units of NovoLog before eating dessert or eating a large carbohydrate meal when she expects her sugar to go up Also discussed that if she does have steroids again she can take up to 10 to 12 units of NovoLog as needed along with increasing her Lantus  She will need to adjust her Lantus dose about once a week based on the fasting blood sugars and go up or down 2 units if blood sugars are not in the desired range Does need follow-up labs including chemistry panel for renal function and microalbumin Also advised her to bring labs from her PCP has we are not able to access those  LIPIDS: Needs follow-up, will check labs today  HYPOTHYROIDISM: Needs follow-up TSH level, the dose has been unchanged by her PCP earlier this year  NEUROPATHY: She needs to inspect her feet daily because of her sensory  loss  Counseling time on subjects discussed in assessment and plan sections is over 50% of today's 25 minute visit     There are no Patient Instructions on file for this visit.  Elayne Snare 12/23/2017, 2:29 PM   Note: This office note was prepared with Dragon voice recognition system technology. Any transcriptional errors that result from this process are unintentional. `

## 2017-12-23 NOTE — Patient Instructions (Addendum)
Guide Me meter  3-4 Novolog before large meals or sweets  Check blood sugars on waking up  3-4/7  Also check blood sugars about 2 hours after a meal and do this after different meals by rotation  Recommended blood sugar levels on waking up is 90-130 and about 2 hours after meal is 130-160  Please bring your blood sugar monitor to each visit, thank you

## 2017-12-24 LAB — BASIC METABOLIC PANEL
BUN: 22 mg/dL (ref 6–23)
CHLORIDE: 100 meq/L (ref 96–112)
CO2: 26 mEq/L (ref 19–32)
Calcium: 10.4 mg/dL (ref 8.4–10.5)
Creatinine, Ser: 0.83 mg/dL (ref 0.40–1.20)
GFR: 70.91 mL/min (ref >60.00)
Glucose, Bld: 100 mg/dL — ABNORMAL HIGH (ref 70–99)
POTASSIUM: 3.9 meq/L (ref 3.5–5.1)
SODIUM: 140 meq/L (ref 135–145)

## 2017-12-24 LAB — TSH: TSH: 3.22 u[IU]/mL (ref 0.35–4.50)

## 2017-12-27 ENCOUNTER — Ambulatory Visit (INDEPENDENT_AMBULATORY_CARE_PROVIDER_SITE_OTHER): Payer: Medicare Other

## 2017-12-27 DIAGNOSIS — M8580 Other specified disorders of bone density and structure, unspecified site: Secondary | ICD-10-CM

## 2017-12-27 DIAGNOSIS — M818 Other osteoporosis without current pathological fracture: Secondary | ICD-10-CM

## 2017-12-27 DIAGNOSIS — E538 Deficiency of other specified B group vitamins: Secondary | ICD-10-CM | POA: Diagnosis not present

## 2017-12-27 NOTE — Progress Notes (Signed)
Pre visit review using our clinic tool,if applicable. No additional management support is needed unless otherwise documented below in the visit note.   Pt here for monthly B12 injection per order from Dr. Kathlene November.  B12 1070mcg given IM Right deltoid and pt tolerated injection well. Given Prolia 60 mg SQ Left arm. Patient tolerated well.  Next B12 injection scheduled for 1 month and Prolia 6 month reminder card given. Patient states she will not be in town for nexrt Prolia injection.

## 2018-01-22 ENCOUNTER — Other Ambulatory Visit: Payer: Self-pay | Admitting: Endocrinology

## 2018-01-28 ENCOUNTER — Ambulatory Visit (INDEPENDENT_AMBULATORY_CARE_PROVIDER_SITE_OTHER): Payer: Medicare Other

## 2018-01-28 DIAGNOSIS — E538 Deficiency of other specified B group vitamins: Secondary | ICD-10-CM

## 2018-01-28 MED ORDER — CYANOCOBALAMIN 1000 MCG/ML IJ SOLN
1000.0000 ug | Freq: Once | INTRAMUSCULAR | Status: AC
  Administered 2018-01-28: 1000 ug via INTRAMUSCULAR

## 2018-01-28 NOTE — Progress Notes (Signed)
Pre visit review using our clinic tool,if applicable. No additional management support is needed unless otherwise documented below in the visit note.   Pt here for monthly B12 injection per order from Dr. Kathlene November.  B12 104mcg given IMRight deltoid, and pt tolerated injection well. No complaints viced.  Next B12 injection will be given while patient is in [place]. States she will be back in 6 months.

## 2018-04-09 LAB — HM DEXA SCAN

## 2018-04-27 ENCOUNTER — Other Ambulatory Visit: Payer: Self-pay | Admitting: Endocrinology

## 2018-07-15 ENCOUNTER — Other Ambulatory Visit: Payer: Self-pay

## 2018-07-15 MED ORDER — GLUCOSE BLOOD VI STRP: ORAL_STRIP | 4 refills | Status: DC

## 2018-09-01 ENCOUNTER — Other Ambulatory Visit: Payer: Medicare Other

## 2018-09-03 ENCOUNTER — Other Ambulatory Visit: Payer: Self-pay | Admitting: Endocrinology

## 2018-09-03 ENCOUNTER — Ambulatory Visit: Payer: Medicare Other | Admitting: Endocrinology

## 2018-09-09 ENCOUNTER — Ambulatory Visit (INDEPENDENT_AMBULATORY_CARE_PROVIDER_SITE_OTHER): Payer: Medicare Other | Admitting: Internal Medicine

## 2018-09-09 ENCOUNTER — Other Ambulatory Visit: Payer: Self-pay

## 2018-09-09 DIAGNOSIS — M25569 Pain in unspecified knee: Secondary | ICD-10-CM

## 2018-09-09 DIAGNOSIS — E038 Other specified hypothyroidism: Secondary | ICD-10-CM

## 2018-09-09 DIAGNOSIS — I10 Essential (primary) hypertension: Secondary | ICD-10-CM

## 2018-09-09 DIAGNOSIS — R5382 Chronic fatigue, unspecified: Secondary | ICD-10-CM

## 2018-09-09 DIAGNOSIS — E119 Type 2 diabetes mellitus without complications: Secondary | ICD-10-CM | POA: Diagnosis not present

## 2018-09-09 DIAGNOSIS — E785 Hyperlipidemia, unspecified: Secondary | ICD-10-CM

## 2018-09-09 MED ORDER — ZOLPIDEM TARTRATE 10 MG PO TABS: 5.0000 mg | ORAL_TABLET | Freq: Every evening | ORAL | 1 refills | Status: DC | PRN

## 2018-09-09 MED ORDER — MELOXICAM 7.5 MG PO TABS: 7.5000 mg | ORAL_TABLET | Freq: Every day | ORAL | 0 refills | Status: DC | PRN

## 2018-09-09 NOTE — Progress Notes (Signed)
Subjective:    Patient ID: Sharon Armstrong, female    DOB: [birth year], 78 y.o.   MRN: [id number]  DOS:  09/09/2018 Type of visit - description: Virtual Visit via Video Note  I connected with@ on 09/09/18 at  1:40 PM EDT by a video enabled telemedicine application and verified that I am speaking with the correct person using two identifiers.   THIS ENCOUNTER IS A VIRTUAL VISIT DUE TO COVID-19 - PATIENT WAS NOT SEEN IN THE OFFICE. PATIENT HAS CONSENTED TO VIRTUAL VISIT / TELEMEDICINE VISIT   Location of patient: home  Location of provider: office  I discussed the limitations of evaluation and management by telemedicine and the availability of in person appointments. The patient expressed understanding and agreed to proceed.  History of Present Illness: Routine visit Last visit in 2018, since then she usually see Dr. Danise Mina, her primary doctor in [place]. Request a B12 shot Request labs done Had a patella fracture, few months ago, still hurting. Needs a refill on meloxicam. GI symptoms: On and off, ongoing Anxiety, insomnia: From time to time needs Ambien, request a refill  Review of Systems Denies fever chills. No cough No chest pain no difficulty breathing   Past Medical History:  Diagnosis Date  . Adenomatous polyps 11/1998  . Anxiety   . Aortic valve insufficiency    mild  . Breast cancer ([facility]) 04/2007   s/p XRT (last 4/09)  . Cervical myelopathy ([facility])   . Cervical stenosis of spine    sees neuro surgery and dr.ramons s/p local injection 4/09  . Chronic constipation   . Chronic pancreatitis ([facility]) 09/29/2014  . Colon polyps 02/2010   Tubular  Adenomas                                     . Diabetes mellitus   . Difficulty in urination    pt uses cath at times  . Diverticulosis of colon   . Elevated liver function tests    fatty liver per CT 2009  . Fatty liver   . Full body hives    if patient does not take Fexofenadine  . Gastroparesis 10/2015   gastric  emptying abnormal for 4 hours  . GERD (gastroesophageal reflux disease)   . Hemorrhoids   . Hyperlipemia   . Hypertension   . Hypothyroidism   . Insomnia   . Lumbar spondylosis    Lumbar epidural steroid injection L5-S1 to the left, Dr. Nelva Bush  . Mitral valve regurgitation   . Osteoarthritis   . 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 Restless leg syndrome   . Urticaria 06/2009    Past Surgical History:  Procedure Laterality Date  . ANTERIOR CERVICAL DECOMP/DISCECTOMY FUSION N/A 01/08/2013   Procedure: Cervical three-four, four-five, five-six anterior cervical decompression with fusion plating and bonegraft;  Surgeon: Eustace Moore, MD;  Location: [facility];  Service: Neurosurgery;  Laterality: N/A;  Cervical three-four, four-five, five-six anterior cervical decompression with fusion plating and bonegraft  . APPENDECTOMY    . BREAST LUMPECTOMY Left 2009   left breast  . CARDIAC CATHETERIZATION  2001  . CATARACT EXTRACTION, BILATERAL  2/11  . EYE SURGERY    . GANGLION CYST EXCISION Right    wrist  . sinus cyst removed    . UTERINE FIBROID SURGERY      Social History   Socioeconomic  History  . Marital status: Married    Spouse name: Not on file  . Number of children: 2  . Years of education: Not on file  . Highest education level: Not on file  Occupational History  . Occupation: Advertising account executive  Social Needs  . Financial resource strain: Not on file  . Food insecurity:    Worry: Not on file    Inability: Not on file  . Transportation needs:    Medical: Not on file    Non-medical: Not on file  Tobacco Use  . Smoking status: Never Smoker  . Smokeless tobacco: Never Used  Substance and Sexual Activity  . Alcohol use: Yes    Alcohol/week: 0.0 standard drinks    Comment: rare  . Drug use: No  . Sexual activity: Not on file  Lifestyle  . Physical activity:    Days per week: Not on file    Minutes per session: Not on file  . Stress: Not on file   Relationships  . Social connections:    Talks on phone: Not on file    Gets together: Not on file    Attends religious service: Not on file    Active member of club or organization: Not on file    Attends meetings of clubs or organizations: Not on file    Relationship status: Not on file  . Intimate partner violence:    Fear of current or ex partner: Not on file    Emotionally abused: Not on file    Physically abused: Not on file    Forced sexual activity: Not on file  Other Topics Concern  . Not on file  Social History Narrative   Lives in [place] half of the time          Allergies as of 09/09/2018      Reactions   Gabapentin Swelling   Edema, legs    Iodine Hives   Iohexol    Nsaids Other (See Comments)   REACTION: hallucinations Tolerates Meloxicam    Tolmetin Other (See Comments)   REACTION: hallucinations   Aciphex [rabeprazole] Rash   Ioxaglate Rash, Hives   Ivp Dye [iodinated Diagnostic Agents] Hives, Rash   Nexium [esomeprazole Magnesium] Rash   Pepcid [famotidine] Rash   Protonix [pantoprazole Sodium] Rash      Medication List       Accurate as of September 09, 2018  1:58 PM. Always use your most recent med list.        Aspercreme Lidocaine 4 % Ptch Generic drug:  Lidocaine Apply topically.   carbidopa-levodopa 25-250 MG disintegrating tablet Commonly known as:  PARCOPA Take 1-2 tablets by mouth at bedtime.   Cyanocobalamin 1000 MCG/ML Kit Inject as directed.   glucose blood test strip Commonly known as:  Accu-Chek Compact Plus CHECK BLOOD SUGARS NO MORE THAN TWICE DAILY DX E11.9   glucose blood test strip Commonly known as:  Accu-Chek Guide Use to test blood sugar twice daily- Dx code E11.9   insulin aspart 100 UNIT/ML FlexPen Commonly known as:  NovoLOG FlexPen 6-8 units before meals when sugars are over 200   Insulin Glargine 100 UNIT/ML Solostar Pen Commonly known as:  Lantus SoloStar INJECT 38 UNITS INTO THE SKIN DAILY .   Insulin  Pen Needle 31G X 6 MM Misc Use to inject insulin   levothyroxine 100 MCG tablet Commonly known as:  SYNTHROID Take 1 tablet (100 mcg total) by mouth daily.   lisinopril 10 MG tablet Commonly  known as:  ZESTRIL Take 0.5 tablets (5 mg total) by mouth daily.   meloxicam 7.5 MG tablet Commonly known as:  MOBIC Take 1 tablet (7.5 mg total) by mouth daily as needed for pain.   metFORMIN 500 MG 24 hr tablet Commonly known as:  GLUCOPHAGE-XR TAKE 4 TABLETS (2,000 MG TOTAL) DAILY WITH SUPPER BY MOUTH.   milk thistle 175 MG tablet Take 175 mg by mouth daily.   ondansetron 4 MG tablet Commonly known as:  ZOFRAN Take 1 tablet (4 mg total) by mouth every 8 (eight) hours as needed for nausea or vomiting.   OneTouch Delica Lancets Misc Check once daily.   Accu-Chek FastClix Lancets Misc Use to test blood twice daily- Dx code E11.9   traMADol 50 MG tablet Commonly known as:  ULTRAM Take 2 tablets by mouth 2 (two) times daily.   triamterene-hydrochlorothiazide 75-50 MG tablet Commonly known as:  MAXZIDE Take 0.5 tablets by mouth daily.   zolpidem 10 MG tablet Commonly known as:  AMBIEN Take 0.5-1 tablets (5-10 mg total) by mouth at bedtime as needed for sleep.           Objective:   Physical Exam There were no vitals taken for this visit. This was video conference, alert oriented x3, in no apparent distress.    Assessment     Assessment> DM -- used to see Dr Dwyane Dee HTN Hyperlipidemia- not on meds  Hypothyroidism Anxiety , insomnia Murmur: Echo 04/12/2014 in [place] trace MR Chronic fatigue (rx empiric B12 shot 12-2014) RLS -  MSK: on ultram per pain mngmt --Spinal cervical stenosis, local injections, surgery 12-2012 --Low back pain-- Dr Nelva Bush --DJD  --osteoporosis, last dexa 01-2014, last Prolia 10-2015 GI: used to see Dr Fuller Plan, last OV 10-2014 , now Dr Roney Mans  --GERD, --Chronic pancreatitis, h/o episodes of acute pancreatitis --h/o fatty liver and ? Of early cirrhosis  by MRI --Chronic constipation --Nausea, vomiting, eval in [place]~ 12- 2016: Delayed gastric emptying, EGD biopsy no H. Pylori,SIBO +, Rx ABX Breast cancer dx 2008 XRT H/o +PPD H/o Urticaria H/ o Mild aortic valve insufficiency  PLAN: DM: Follow-up by endocrinology HTN: Ambulatory BPs in the 120s over 60, currently on lisinopril, Maxide.  Due for labs. Hyperlipidemia: Diet controlled, due for labs Hypothyroidism: On Synthroid, due for labs Anxiety, insomnia: At baseline, doing okay, rarely takes Ambien, needs a refill Chronic fatigue: Will come back for a B12 shot. MSK: -- Low back pain, on Ultram per Dr Nelva Bush, she also takes meloxicam rarely without problems, prescription sent -- knee pain: Had patella fracture 03-2018, and saw orthopedic doctor in [place], was treated conservatively, still having pain.  Recommend to see one of the orthopedic doctors in town.  States she will. GERD, nausea: On and off, chronic issue, on Zofran.  Recommend to reach out to GI symptoms severe. Plan: My nurse will contact the patient, will schedule nurse visit for a B12 shot We will schedule labs, Dr. Danise Mina is also requesting some labs, patient will bring the form. CMP, CBC: HTN FLP: Hyperlipidemia TSH: Hypothyroidism RTC 4 months  F2 F: More than 25 minutes, addressing multiple chronic medical problems   I discussed the assessment and treatment plan with the patient. The patient was provided an opportunity to ask questions and all were answered. The patient agreed with the plan and demonstrated an understanding of the instructions.   The patient was advised to call back or seek an in-person evaluation if the symptoms worsen or if the condition  fails to improve as anticipated.

## 2018-09-10 NOTE — Assessment & Plan Note (Signed)
DM: Follow-up by endocrinology HTN: Ambulatory BPs in the 120s over 60, currently on lisinopril, Maxide.  Due for labs. Hyperlipidemia: Diet controlled, due for labs Hypothyroidism: On Synthroid, due for labs Anxiety, insomnia: At baseline, doing okay, rarely takes Ambien, needs a refill Chronic fatigue: Will come back for a B12 shot. MSK: -- Low back pain, on Ultram per Dr Nelva Bush, she also takes meloxicam rarely without problems, prescription sent -- knee pain: Had patella fracture 03-2018, and saw orthopedic doctor in [place], was treated conservatively, still having pain.  Recommend to see one of the orthopedic doctors in town.  States she will. GERD, nausea: On and off, chronic issue, on Zofran.  Recommend to reach out to GI symptoms severe. Plan: My nurse will contact the patient, will schedule nurse visit for a B12 shot We will schedule labs, Dr. Danise Mina is also requesting some labs, patient will bring the form. CMP, CBC: HTN FLP: Hyperlipidemia TSH: Hypothyroidism RTC 4 months

## 2018-09-11 ENCOUNTER — Other Ambulatory Visit: Payer: Self-pay

## 2018-09-11 ENCOUNTER — Other Ambulatory Visit (INDEPENDENT_AMBULATORY_CARE_PROVIDER_SITE_OTHER): Payer: Medicare Other

## 2018-09-11 ENCOUNTER — Ambulatory Visit (INDEPENDENT_AMBULATORY_CARE_PROVIDER_SITE_OTHER): Payer: Medicare Other

## 2018-09-11 DIAGNOSIS — E538 Deficiency of other specified B group vitamins: Secondary | ICD-10-CM | POA: Diagnosis not present

## 2018-09-11 DIAGNOSIS — E785 Hyperlipidemia, unspecified: Secondary | ICD-10-CM | POA: Diagnosis not present

## 2018-09-11 DIAGNOSIS — I10 Essential (primary) hypertension: Secondary | ICD-10-CM

## 2018-09-11 DIAGNOSIS — E038 Other specified hypothyroidism: Secondary | ICD-10-CM

## 2018-09-11 LAB — CBC WITH DIFFERENTIAL/PLATELET
Basophils Absolute: 0 10*3/uL (ref 0.0–0.1)
Basophils Relative: 0.2 % (ref 0.0–3.0)
Eosinophils Absolute: 0 10*3/uL (ref 0.0–0.7)
Eosinophils Relative: 0 % (ref 0.0–5.0)
HCT: 37.7 % (ref 36.0–46.0)
Hemoglobin: 12.5 g/dL (ref 12.0–15.0)
Lymphocytes Relative: 46.9 % — ABNORMAL HIGH (ref 12.0–46.0)
Lymphs Abs: 3 10*3/uL (ref 0.7–4.0)
MCHC: 33.2 g/dL (ref 30.0–36.0)
MCV: 95.2 fl (ref 78.0–100.0)
Monocytes Absolute: 0.5 10*3/uL (ref 0.1–1.0)
Monocytes Relative: 7.1 % (ref 3.0–12.0)
Neutro Abs: 3 10*3/uL (ref 1.4–7.7)
Neutrophils Relative %: 45.8 % (ref 43.0–77.0)
Platelets: 163 10*3/uL (ref 150.0–400.0)
RBC: 3.96 Mil/uL (ref 3.87–5.11)
RDW: 14.3 % (ref 11.5–15.5)
WBC: 6.5 10*3/uL (ref 4.0–10.5)

## 2018-09-11 LAB — LIPID PANEL
Cholesterol: 168 mg/dL (ref 0–200)
HDL: 45.8 mg/dL (ref >39.00)
LDL Cholesterol: 101 mg/dL — ABNORMAL HIGH (ref 0–99)
NonHDL: 121.89
Total CHOL/HDL Ratio: 4
Triglycerides: 106 mg/dL (ref 0.0–149.0)
VLDL: 21.2 mg/dL (ref 0.0–40.0)

## 2018-09-11 LAB — COMPREHENSIVE METABOLIC PANEL
ALT: 17 U/L (ref 0–35)
AST: 33 U/L (ref 0–37)
Albumin: 4.1 g/dL (ref 3.5–5.2)
Alkaline Phosphatase: 31 U/L — ABNORMAL LOW (ref 39–117)
BUN: 32 mg/dL — ABNORMAL HIGH (ref 6–23)
CO2: 30 mEq/L (ref 19–32)
Calcium: 9.7 mg/dL (ref 8.4–10.5)
Chloride: 102 mEq/L (ref 96–112)
Creatinine, Ser: 0.9 mg/dL (ref 0.40–1.20)
GFR: 60.65 mL/min (ref >60.00)
Glucose, Bld: 118 mg/dL — ABNORMAL HIGH (ref 70–99)
Potassium: 4.4 mEq/L (ref 3.5–5.1)
Sodium: 142 mEq/L (ref 135–145)
Total Bilirubin: 0.4 mg/dL (ref 0.2–1.2)
Total Protein: 7 g/dL (ref 6.0–8.3)

## 2018-09-11 LAB — TSH: TSH: 4.5 u[IU]/mL (ref 0.35–4.50)

## 2018-09-11 MED ORDER — CYANOCOBALAMIN 1000 MCG/ML IJ SOLN
1000.0000 ug | Freq: Once | INTRAMUSCULAR | Status: AC
  Administered 2018-09-11: 1000 ug via INTRAMUSCULAR

## 2018-10-08 NOTE — Progress Notes (Signed)
Here for B12 shot  Kathlene November, MD

## 2018-11-12 ENCOUNTER — Ambulatory Visit: Payer: Medicare Other

## 2018-11-12 DIAGNOSIS — I350 Nonrheumatic aortic (valve) stenosis: Secondary | ICD-10-CM | POA: Insufficient documentation

## 2018-11-13 ENCOUNTER — Other Ambulatory Visit: Payer: Self-pay

## 2018-11-13 ENCOUNTER — Ambulatory Visit (INDEPENDENT_AMBULATORY_CARE_PROVIDER_SITE_OTHER): Payer: Medicare Other

## 2018-11-13 DIAGNOSIS — E538 Deficiency of other specified B group vitamins: Secondary | ICD-10-CM

## 2018-11-13 MED ORDER — CYANOCOBALAMIN 1000 MCG/ML IJ SOLN
1000.0000 ug | Freq: Once | INTRAMUSCULAR | Status: AC
  Administered 2018-11-13: 1000 ug via INTRAMUSCULAR

## 2018-11-13 NOTE — Progress Notes (Signed)
Here for B12 injection due to vit B12 dificiency,

## 2018-12-27 ENCOUNTER — Other Ambulatory Visit: Payer: Self-pay | Admitting: Endocrinology

## 2019-01-01 LAB — HEMOGLOBIN A1C: Hemoglobin A1C: 7

## 2019-01-09 DIAGNOSIS — Z952 Presence of prosthetic heart valve: Secondary | ICD-10-CM | POA: Insufficient documentation

## 2019-01-09 HISTORY — PX: TRANSCATHETER AORTIC VALVE REPLACEMENT, TRANSAORTIC: SHX6402

## 2019-01-09 MED ORDER — CARBIDOPA-LEVODOPA 25-250 MG PO TABS
1.00 | ORAL_TABLET | ORAL | Status: DC
Start: 2019-01-09 — End: 2019-01-09

## 2019-01-09 MED ORDER — ZOLPIDEM TARTRATE 5 MG PO TABS
10.00 | ORAL_TABLET | ORAL | Status: DC
Start: ? — End: 2019-01-09

## 2019-01-09 MED ORDER — GLUCOSE 40 % PO GEL
15.00 | ORAL | Status: DC
Start: ? — End: 2019-01-09

## 2019-01-09 MED ORDER — ATROPINE SULFATE 0.5 MG/5ML IJ SOSY
0.40 | PREFILLED_SYRINGE | INTRAMUSCULAR | Status: DC
Start: ? — End: 2019-01-09

## 2019-01-09 MED ORDER — INSULIN LISPRO 100 UNIT/ML ~~LOC~~ SOLN
1.00 | SUBCUTANEOUS | Status: DC
Start: 2019-01-09 — End: 2019-01-09

## 2019-01-09 MED ORDER — INSULIN GLARGINE 100 UNIT/ML SOLOSTAR PEN
35.00 | PEN_INJECTOR | SUBCUTANEOUS | Status: DC
Start: 2019-01-10 — End: 2019-01-09

## 2019-01-09 MED ORDER — LISINOPRIL 5 MG PO TABS
5.00 | ORAL_TABLET | ORAL | Status: DC
Start: 2019-01-10 — End: 2019-01-09

## 2019-01-09 MED ORDER — DEXTROSE 10 % IV SOLN
125.00 | INTRAVENOUS | Status: DC
Start: ? — End: 2019-01-09

## 2019-01-09 MED ORDER — PHENYLEPH-POT GUAIACOLSULF
81.00 | Status: DC
Start: 2019-01-10 — End: 2019-01-09

## 2019-01-09 MED ORDER — LEVOTHYROXINE SODIUM 50 MCG PO TABS
100.00 | ORAL_TABLET | ORAL | Status: DC
Start: 2019-01-10 — End: 2019-01-09

## 2019-01-09 MED ORDER — CLOPIDOGREL BISULFATE 75 MG PO TABS
75.00 | ORAL_TABLET | ORAL | Status: DC
Start: 2019-01-10 — End: 2019-01-09

## 2019-01-09 MED ORDER — TRAMADOL HCL 50 MG PO TABS
100.00 | ORAL_TABLET | ORAL | Status: DC
Start: ? — End: 2019-01-09

## 2019-01-12 ENCOUNTER — Encounter: Payer: Self-pay | Admitting: Internal Medicine

## 2019-01-12 ENCOUNTER — Other Ambulatory Visit: Payer: Self-pay

## 2019-01-12 ENCOUNTER — Ambulatory Visit: Payer: Medicare Other | Admitting: Internal Medicine

## 2019-01-13 ENCOUNTER — Ambulatory Visit (INDEPENDENT_AMBULATORY_CARE_PROVIDER_SITE_OTHER): Payer: Medicare Other | Admitting: Internal Medicine

## 2019-01-13 ENCOUNTER — Encounter: Payer: Self-pay | Admitting: Internal Medicine

## 2019-01-13 VITALS — BP 129/64 | HR 81 | Temp 98.2°F | Resp 16 | Ht 64.0 in | Wt 148.5 lb

## 2019-01-13 DIAGNOSIS — Z952 Presence of prosthetic heart valve: Secondary | ICD-10-CM

## 2019-01-13 DIAGNOSIS — E038 Other specified hypothyroidism: Secondary | ICD-10-CM

## 2019-01-13 DIAGNOSIS — E785 Hyperlipidemia, unspecified: Secondary | ICD-10-CM

## 2019-01-13 DIAGNOSIS — I350 Nonrheumatic aortic (valve) stenosis: Secondary | ICD-10-CM

## 2019-01-13 DIAGNOSIS — E538 Deficiency of other specified B group vitamins: Secondary | ICD-10-CM | POA: Diagnosis not present

## 2019-01-13 DIAGNOSIS — D649 Anemia, unspecified: Secondary | ICD-10-CM | POA: Diagnosis not present

## 2019-01-13 DIAGNOSIS — M818 Other osteoporosis without current pathological fracture: Secondary | ICD-10-CM | POA: Diagnosis not present

## 2019-01-13 DIAGNOSIS — I10 Essential (primary) hypertension: Secondary | ICD-10-CM | POA: Diagnosis not present

## 2019-01-13 LAB — COMPREHENSIVE METABOLIC PANEL
ALT: 16 U/L (ref 0–35)
AST: 30 U/L (ref 0–37)
Albumin: 4.1 g/dL (ref 3.5–5.2)
Alkaline Phosphatase: 41 U/L (ref 39–117)
BUN: 20 mg/dL (ref 6–23)
CO2: 29 mEq/L (ref 19–32)
Calcium: 9.8 mg/dL (ref 8.4–10.5)
Chloride: 103 mEq/L (ref 96–112)
Creatinine, Ser: 0.76 mg/dL (ref 0.40–1.20)
GFR: 73.66 mL/min (ref >60.00)
Glucose, Bld: 162 mg/dL — ABNORMAL HIGH (ref 70–99)
Potassium: 4.3 mEq/L (ref 3.5–5.1)
Sodium: 140 mEq/L (ref 135–145)
Total Bilirubin: 0.5 mg/dL (ref 0.2–1.2)
Total Protein: 6.8 g/dL (ref 6.0–8.3)

## 2019-01-13 LAB — CBC WITH DIFFERENTIAL/PLATELET
Basophils Absolute: 0 10*3/uL (ref 0.0–0.1)
Basophils Relative: 0.3 % (ref 0.0–3.0)
Eosinophils Absolute: 0 10*3/uL (ref 0.0–0.7)
Eosinophils Relative: 0 % (ref 0.0–5.0)
HCT: 33.9 % — ABNORMAL LOW (ref 36.0–46.0)
Hemoglobin: 11.3 g/dL — ABNORMAL LOW (ref 12.0–15.0)
Lymphocytes Relative: 37.4 % (ref 12.0–46.0)
Lymphs Abs: 2.4 10*3/uL (ref 0.7–4.0)
MCHC: 33.3 g/dL (ref 30.0–36.0)
MCV: 95.9 fl (ref 78.0–100.0)
Monocytes Absolute: 0.7 10*3/uL (ref 0.1–1.0)
Monocytes Relative: 11.5 % (ref 3.0–12.0)
Neutro Abs: 3.3 10*3/uL (ref 1.4–7.7)
Neutrophils Relative %: 50.8 % (ref 43.0–77.0)
Platelets: 152 10*3/uL (ref 150.0–400.0)
RBC: 3.53 Mil/uL — ABNORMAL LOW (ref 3.87–5.11)
RDW: 14.1 % (ref 11.5–15.5)
WBC: 6.4 10*3/uL (ref 4.0–10.5)

## 2019-01-13 LAB — FERRITIN: Ferritin: 43.4 ng/mL (ref 10.0–291.0)

## 2019-01-13 LAB — IRON: Iron: 64 ug/dL (ref 42–145)

## 2019-01-13 LAB — TSH: TSH: 4.22 u[IU]/mL (ref 0.35–4.50)

## 2019-01-13 LAB — MAGNESIUM: Magnesium: 1.5 mg/dL (ref 1.5–2.5)

## 2019-01-13 MED ORDER — DENOSUMAB 60 MG/ML ~~LOC~~ SOSY
60.0000 mg | PREFILLED_SYRINGE | Freq: Once | SUBCUTANEOUS | Status: AC
  Administered 2019-01-13: 60 mg via SUBCUTANEOUS

## 2019-01-13 MED ORDER — CYANOCOBALAMIN 1000 MCG/ML IJ SOLN
1000.0000 ug | Freq: Once | INTRAMUSCULAR | Status: AC
  Administered 2019-01-13: 10:00:00 1000 ug via INTRAMUSCULAR

## 2019-01-13 NOTE — Patient Instructions (Addendum)
GO TO THE LAB : Get the blood work     GO TO THE FRONT DESK Schedule your next appointment   for a checkup in 2 to 3 months   B12 monthly

## 2019-01-13 NOTE — Progress Notes (Signed)
Pre visit review using our clinic review tool, if applicable. No additional management support is needed unless otherwise documented below in the visit note. 

## 2019-01-13 NOTE — Progress Notes (Signed)
Subjective:    Patient ID: Sharon Armstrong, female    DOB: [birth year], 78 y.o.   MRN: [id number]  DOS:  01/13/2019 Type of visit - description: ED follow-up  Since the last time I saw her, she was evaluated for aortic stenosis which over time become severe. Had a cardiac catheterization which revealed mild nonobstructive coronary artery disease. Status post TVAR 01/09/2019 through the right groin.  Patient reports had a temporary pacemaker inserted through the left groin. She was recommended to stay on aspirin and clopidogrel for 3 months and then on aspirin indefinitely.  Subsequently, went to the ER 01/11/2019, reported right leg numbness, tingling that radiated from the right groin down. At the ER, an ultrasound was done, no fistula or arterial aneurysm noted. They suspected a neuropathy related to the recent procedure.  Labs 01/08/2019: Potassium 3.9, creatinine 0.8, hemoglobin 10.9 Labs: 01/09/2019: Potassium 3.4, creatinine 0.9, Calcium 9.3.  Hemoglobin 10.4, platelets 133, WBC 9.4, magnesium 1.4.   Review of Systems Since she left the ER, the tingling is minimal and on and off. Denies any right leg swelling, pain, numbness . No bleeding from the sides of catheter insertions. No fever chills Following the procedure she actually feels great, had DOE at that seems resolved. Denies any abdominal pain, diarrhea or blood in the stools. No back pain.  Past Medical History:  Diagnosis Date  . Adenomatous polyps 11/1998  . Anxiety   . Aortic valve insufficiency    mild  . Breast cancer ([facility]) 04/2007   s/p XRT (last 4/09)  . Cervical myelopathy ([facility])   . Cervical stenosis of spine    sees neuro surgery and dr.ramons s/p local injection 4/09  . Chronic constipation   . Chronic pancreatitis ([facility]) 09/29/2014  . Colon polyps 02/2010   Tubular  Adenomas                                     . Diabetes mellitus   . Difficulty in urination    pt uses cath at times  . Diverticulosis of  colon   . Elevated liver function tests    fatty liver per CT 2009  . Fatty liver   . Full body hives    if patient does not take Fexofenadine  . Gastroparesis 10/2015   gastric emptying abnormal for 4 hours  . GERD (gastroesophageal reflux disease)   . Hemorrhoids   . Hyperlipemia   . Hypertension   . Hypothyroidism   . Insomnia   . Lumbar spondylosis    Lumbar epidural steroid injection L5-S1 to the left, Dr. Nelva Bush  . Mitral valve regurgitation   . Osteoarthritis   . 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 Restless leg syndrome   . Urticaria 06/2009    Past Surgical History:  Procedure Laterality Date  . ANTERIOR CERVICAL DECOMP/DISCECTOMY FUSION N/A 01/08/2013   Procedure: Cervical three-four, four-five, five-six anterior cervical decompression with fusion plating and bonegraft;  Surgeon: Eustace Moore, MD;  Location: [facility];  Service: Neurosurgery;  Laterality: N/A;  Cervical three-four, four-five, five-six anterior cervical decompression with fusion plating and bonegraft  . APPENDECTOMY    . BREAST LUMPECTOMY Left 2009   left breast  . CARDIAC CATHETERIZATION  2001  . CATARACT EXTRACTION, BILATERAL  2/11  . EYE SURGERY    . GANGLION CYST EXCISION Right    wrist  .  sinus cyst removed    . TRANSCATHETER AORTIC VALVE REPLACEMENT, TRANSAORTIC  01/09/2019  . UTERINE FIBROID SURGERY      Social History   Socioeconomic History  . Marital status: Married    Spouse name: Not on file  . Number of children: 2  . Years of education: Not on file  . Highest education level: Not on file  Occupational History  . Occupation: Advertising account executive  Social Needs  . Financial resource strain: Not on file  . Food insecurity    Worry: Not on file    Inability: Not on file  . Transportation needs    Medical: Not on file    Non-medical: Not on file  Tobacco Use  . Smoking status: Never Smoker  . Smokeless tobacco: Never Used  Substance and Sexual Activity  . Alcohol  use: Yes    Alcohol/week: 0.0 standard drinks    Comment: rare  . Drug use: No  . Sexual activity: Not on file  Lifestyle  . Physical activity    Days per week: Not on file    Minutes per session: Not on file  . Stress: Not on file  Relationships  . Social Herbalist on phone: Not on file    Gets together: Not on file    Attends religious service: Not on file    Active member of club or organization: Not on file    Attends meetings of clubs or organizations: Not on file    Relationship status: Not on file  . Intimate partner violence    Fear of current or ex partner: Not on file    Emotionally abused: Not on file    Physically abused: Not on file    Forced sexual activity: Not on file  Other Topics Concern  . Not on file  Social History Narrative   Lives in [place] half of the time          Allergies as of 01/13/2019      Reactions   Gabapentin Swelling   Edema, legs    Iodine Hives   Iohexol    Nsaids Other (See Comments)   REACTION: hallucinations Tolerates Meloxicam    Tolmetin Other (See Comments)   REACTION: hallucinations   Aciphex [rabeprazole] Rash   Ioxaglate Rash, Hives   Ivp Dye [iodinated Diagnostic Agents] Hives, Rash   Nexium [esomeprazole Magnesium] Rash   Pepcid [famotidine] Rash   Protonix [pantoprazole Sodium] Rash      Medication List       Accurate as of January 13, 2019 11:59 PM. If you have any questions, ask your nurse or doctor.        Aspercreme Lidocaine 4 % Ptch Generic drug: Lidocaine Apply topically.   aspirin EC 81 MG tablet Take 81 mg by mouth daily.   carbidopa-levodopa 25-250 MG disintegrating tablet Commonly known as: PARCOPA Take 1-2 tablets by mouth at bedtime.   clopidogrel 75 MG tablet Commonly known as: PLAVIX Take 75 mg by mouth daily.   Cyanocobalamin 1000 MCG/ML Kit Inject as directed.   glucose blood test strip Commonly known as: Accu-Chek Compact Plus CHECK BLOOD SUGARS NO MORE THAN TWICE  DAILY DX E11.9   glucose blood test strip Commonly known as: Accu-Chek Guide Use to test blood sugar twice daily- Dx code E11.9   insulin aspart 100 UNIT/ML FlexPen Commonly known as: NovoLOG FlexPen 6-8 units before meals when sugars are over 200   Insulin Pen Needle 31G X 6  MM Misc Use to inject insulin   Lantus SoloStar 100 UNIT/ML Solostar Pen Generic drug: Insulin Glargine INJECT 38 UNITS INTO THE SKIN DAILY . What changed: additional instructions   levothyroxine 100 MCG tablet Commonly known as: SYNTHROID Take 1 tablet (100 mcg total) by mouth daily.   lisinopril 5 MG tablet Commonly known as: ZESTRIL Take 5 mg by mouth daily. What changed: Another medication with the same name was removed. Continue taking this medication, and follow the directions you see here. Changed by: Kathlene November, MD   meloxicam 7.5 MG tablet Commonly known as: MOBIC Take 1 tablet (7.5 mg total) by mouth daily as needed for pain.   metFORMIN 500 MG 24 hr tablet Commonly known as: GLUCOPHAGE-XR TAKE 4 TABLETS (2,000 MG TOTAL) DAILY WITH SUPPER BY MOUTH. What changed:   how much to take  when to take this   milk thistle 175 MG tablet Take 175 mg by mouth daily.   ondansetron 4 MG tablet Commonly known as: ZOFRAN Take 1 tablet (4 mg total) by mouth every 8 (eight) hours as needed for nausea or vomiting.   OneTouch Delica Lancets Misc Check once daily.   Accu-Chek FastClix Lancets Misc Use to test blood twice daily- Dx code E11.9   traMADol 50 MG tablet Commonly known as: ULTRAM Take 2 tablets by mouth every 6 (six) hours as needed for moderate pain.   triamterene-hydrochlorothiazide 37.5-25 MG tablet Commonly known as: MAXZIDE-25 Take 1 tablet by mouth daily. What changed: Another medication with the same name was removed. Continue taking this medication, and follow the directions you see here. Changed by: Kathlene November, MD   zolpidem 10 MG tablet Commonly known as: AMBIEN Take 0.5-1  tablets (5-10 mg total) by mouth at bedtime as needed for sleep.           Objective:   Physical Exam BP 129/64 (BP Location: Left Arm, Patient Position: Sitting, Cuff Size: Small)   Pulse 81   Temp 98.2 F (36.8 C) (Temporal)   Resp 16   Ht '5\' 4"'$  (1.626 m)   Wt 148 lb 8 oz (67.4 kg)   SpO2 98%   BMI 25.49 kg/m  General:   Well developed, NAD, BMI noted.  HEENT:  Normocephalic . Face symmetric, atraumatic Lungs:  CTA B Normal respiratory effort, no intercostal retractions, no accessory muscle use. Heart: RRR, mild systolic murmur?.  no pretibial edema bilaterally  Good right femoral pulse Good bilateral pedal pulses Right groin: No pulsatile mass, appropriate tenderness at the site of incision.  No bruit. Left groin: During the TAVR procedure they also place a pacemaker through the left groin, area show no mass and is not TTP Abdomen:  Not distended, soft, non-tender. No rebound or rigidity.   Skin: Not pale. Not jaundice Neurologic:  alert & oriented X3.  Speech normal, gait appropriate for age and unassisted Psych--  Cognition and judgment appear intact.  Cooperative with normal attention span and concentration.  Behavior appropriate. No anxious or depressed appearing.     Assessment      Assessment DM -- used to see Dr Dwyane Dee HTN Hyperlipidemia- not on meds  Hypothyroidism Anxiety , insomnia CV: S/p Tvar 12/2018 @ [facility] Chronic fatigue (rx empiric B12 shot 12-2014) RLS  MSK: on ultram per pain mngmt --Spinal cervical stenosis, local injections, surgery 12-2012 --Low back pain-- Dr Nelva Bush --DJD  --osteoporosis, last dexa 01-2014, last Prolia 10-2015 GI: used to see Dr Fuller Plan, last OV 10-2014 , now Dr Roney Mans  --  GERD, --Chronic pancreatitis, h/o episodes of acute pancreatitis --h/o fatty liver and ? Of early cirrhosis by MRI --Chronic constipation --Nausea, vomiting, eval in [place]~ 12- 2016: Delayed gastric emptying, EGD biopsy no H. Pylori,SIBO +, Rx ABX  Breast cancer dx 2008 XRT H/o +PPD H/o Urticaria H/ o Mild aortic valve insufficiency  PLAN: Aortic stenosis: Status post TVAR, having some tingling at the point at the site of the catheterization, right groin. On exam there is no evidence of hematoma today.  Recommend close observation of the area and call cardiology if there is any swelling, local pain, back pain, right leg paresthesias or edema. HTN: Continue with Maxide, lisinopril . Check CMP and magnesium Hypothyroidism: Check a TSH Anemia: Mild anemia at the time of the procedure, check a CBC, iron, ferritin.  She does not have GI symptoms, last colonoscopy 2016. Osteoporosis: Okay to proceed with Prolia Fatigue: On B12 shots empirically, okay to take then monthly Preventive care: Already had a flu shot RTC to 3 months

## 2019-01-14 NOTE — Assessment & Plan Note (Signed)
Aortic stenosis: Status post TVAR, having some tingling at the point at the site of the catheterization, right groin. On exam there is no evidence of hematoma today.  Recommend close observation of the area and call cardiology if there is any swelling, local pain, back pain, right leg paresthesias or edema. HTN: Continue with Maxide, lisinopril . Check CMP and magnesium Hypothyroidism: Check a TSH Anemia: Mild anemia at the time of the procedure, check a CBC, iron, ferritin.  She does not have GI symptoms, last colonoscopy 2016. Osteoporosis: Okay to proceed with Prolia Fatigue: On B12 shots empirically, okay to take then monthly Preventive care: Already had a flu shot RTC to 3 months

## 2019-01-15 ENCOUNTER — Encounter: Payer: Self-pay | Admitting: Internal Medicine

## 2019-01-28 ENCOUNTER — Telehealth: Payer: Self-pay | Admitting: Endocrinology

## 2019-01-28 ENCOUNTER — Other Ambulatory Visit: Payer: Self-pay

## 2019-01-28 MED ORDER — LANTUS SOLOSTAR 100 UNIT/ML ~~LOC~~ SOPN: PEN_INJECTOR | SUBCUTANEOUS | 3 refills | Status: DC

## 2019-01-28 NOTE — Telephone Encounter (Signed)
MEDICATION:   Insulin Glargine (LANTUS SOLOSTAR) 100 UNIT/ML Solostar Pen   NEEDS TO BE A 30 DAY SUPPLY DUE TO INSURANCE  PHARMACY:  [id number] - [place], [place] - [address]  IS THIS A 90 DAY SUPPLY : No  IS PATIENT OUT OF MEDICATION:   IF NOT; HOW MUCH IS LEFT: Has 1 month left  LAST APPOINTMENT DATE: @8 /12/2018  NEXT APPOINTMENT DATE:@Visit  date not found  DO WE HAVE YOUR PERMISSION TO LEAVE A DETAILED MESSAGE: Yes  OTHER COMMENTS:    **Let patient know to contact pharmacy at the end of the day to make sure medication is ready. **  ** Please notify patient to allow 48-72 hours to process**  **Encourage patient to contact the pharmacy for refills or they can request refills through [facility]**

## 2019-01-28 NOTE — Telephone Encounter (Signed)
Rx sent 

## 2019-02-03 ENCOUNTER — Encounter: Payer: Self-pay | Admitting: Internal Medicine

## 2019-02-13 ENCOUNTER — Ambulatory Visit (INDEPENDENT_AMBULATORY_CARE_PROVIDER_SITE_OTHER): Payer: Medicare Other | Admitting: *Deleted

## 2019-02-13 ENCOUNTER — Other Ambulatory Visit: Payer: Self-pay

## 2019-02-13 DIAGNOSIS — Z23 Encounter for immunization: Secondary | ICD-10-CM

## 2019-02-13 DIAGNOSIS — E538 Deficiency of other specified B group vitamins: Secondary | ICD-10-CM | POA: Diagnosis not present

## 2019-02-13 MED ORDER — CYANOCOBALAMIN 1000 MCG/ML IJ SOLN
1000.0000 ug | Freq: Once | INTRAMUSCULAR | Status: AC
  Administered 2019-02-13: 12:00:00 1000 ug via INTRAMUSCULAR

## 2019-02-13 NOTE — Progress Notes (Signed)
Patient here for monthly B12 injection per Dr. Larose Kells.  Injection given in Left Deltoid and patient tolerated well  Flu vaccine given in right deltoid and tolerated well.  Patient will schedule next b12 in [place] because she will be there at that time.  She will have them send [place] documentation.

## 2019-03-01 ENCOUNTER — Other Ambulatory Visit: Payer: Self-pay | Admitting: Endocrinology

## 2019-03-13 ENCOUNTER — Encounter: Payer: Self-pay | Admitting: Internal Medicine

## 2019-03-13 ENCOUNTER — Ambulatory Visit (INDEPENDENT_AMBULATORY_CARE_PROVIDER_SITE_OTHER): Payer: Medicare Other | Admitting: Internal Medicine

## 2019-03-13 ENCOUNTER — Other Ambulatory Visit: Payer: Self-pay

## 2019-03-13 VITALS — BP 127/55 | HR 85 | Temp 97.6°F | Resp 16 | Ht 64.0 in | Wt 152.1 lb

## 2019-03-13 DIAGNOSIS — G4700 Insomnia, unspecified: Secondary | ICD-10-CM

## 2019-03-13 DIAGNOSIS — I10 Essential (primary) hypertension: Secondary | ICD-10-CM | POA: Diagnosis not present

## 2019-03-13 DIAGNOSIS — E538 Deficiency of other specified B group vitamins: Secondary | ICD-10-CM

## 2019-03-13 DIAGNOSIS — M818 Other osteoporosis without current pathological fracture: Secondary | ICD-10-CM | POA: Diagnosis not present

## 2019-03-13 DIAGNOSIS — Z952 Presence of prosthetic heart valve: Secondary | ICD-10-CM

## 2019-03-13 DIAGNOSIS — Z09 Encounter for follow-up examination after completed treatment for conditions other than malignant neoplasm: Secondary | ICD-10-CM

## 2019-03-13 MED ORDER — CYANOCOBALAMIN 1000 MCG/ML IJ SOLN
1000.0000 ug | Freq: Once | INTRAMUSCULAR | Status: AC
  Administered 2019-03-13: 1000 ug via INTRAMUSCULAR

## 2019-03-13 MED ORDER — ZOLPIDEM TARTRATE 10 MG PO TABS: 5.0000 mg | ORAL_TABLET | Freq: Every evening | ORAL | 3 refills | Status: DC | PRN

## 2019-03-13 NOTE — Patient Instructions (Addendum)
Please schedule Medicare Wellness with Glenard Haring.   Per our records you are due for an eye exam. Please contact your eye doctor to schedule an appointment. Please have them send copies of your office visit notes to [place]. Our fax number is [phone].   GO TO THE FRONT DESK Schedule your next appointment  For a check up in 6 months  Your hemoglobin was slightly low (11.3 on August 2020) , please check with your doctor in [place] next month

## 2019-03-13 NOTE — Progress Notes (Signed)
Pre visit review using our clinic review tool, if applicable. No additional management support is needed unless otherwise documented below in the visit note. 

## 2019-03-13 NOTE — Progress Notes (Signed)
 Subjective:    Patient ID: Sharon Armstrong, female    DOB: 03/25/1941, 78 y.o.   MRN: [id number]  DOS:  03/13/2019 Type of visit - description: Routine visit Since the last office visit she is doing well. Saw cardiology, felt to be stable B12 deficiency: Due for a shot today Insomnia: Needs a refill on Ambien.  Sometimes she feel asleep during the day and cannot sleep at night. Mild anemia noted on the last labs 2 months ago    Review of Systems Denies fever chills No chest pain no difficulty breathing No edema No palpitation Chronic nausea at baseline, no blood in the stools.  Past Medical History:  Diagnosis Date  . Adenomatous polyps 11/1998  . Anxiety   . Aortic valve insufficiency    mild  . Breast cancer ([facility]) 04/2007   s/p XRT (last 4/09)  . Cervical myelopathy ([facility])   . Cervical stenosis of spine    sees neuro surgery and dr.ramons s/p local injection 4/09  . Chronic constipation   . Chronic pancreatitis ([facility]) 09/29/2014  . Colon polyps 02/2010   Tubular  Adenomas                                     . Diabetes mellitus   . Difficulty in urination    pt uses cath at times  . Diverticulosis of colon   . Elevated liver function tests    fatty liver per CT 2009  . Fatty liver   . Full body hives    if patient does not take Fexofenadine  . Gastroparesis 10/2015   gastric emptying abnormal for 4 hours  . GERD (gastroesophageal reflux disease)   . Hemorrhoids   . Hyperlipemia   . Hypertension   . Hypothyroidism   . Insomnia   . Lumbar spondylosis    Lumbar epidural steroid injection L5-S1 to the left, Dr. Ramos  . Mitral valve regurgitation   . Osteoarthritis   . 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 Restless leg syndrome   . Urticaria 06/2009    Past Surgical History:  Procedure Laterality Date  . ANTERIOR CERVICAL DECOMP/DISCECTOMY FUSION N/A 01/08/2013   Procedure: Cervical three-four, four-five, five-six anterior cervical decompression  with fusion plating and bonegraft;  Surgeon: David S Jones, MD;  Location: [facility];  Service: Neurosurgery;  Laterality: N/A;  Cervical three-four, four-five, five-six anterior cervical decompression with fusion plating and bonegraft  . APPENDECTOMY    . BREAST LUMPECTOMY Left 2009   left breast  . CARDIAC CATHETERIZATION  2001  . CATARACT EXTRACTION, BILATERAL  2/11  . EYE SURGERY    . GANGLION CYST EXCISION Right    wrist  . sinus cyst removed    . TRANSCATHETER AORTIC VALVE REPLACEMENT, TRANSAORTIC  01/09/2019  . UTERINE FIBROID SURGERY      Social History   Socioeconomic History  . Marital status: Married    Spouse name: Not on file  . Number of children: 2  . Years of education: Not on file  . Highest education level: Not on file  Occupational History  . Occupation: custom decorator  Social Needs  . Financial resource strain: Not on file  . Food insecurity    Worry: Not on file    Inability: Not on file  . Transportation needs    Medical: Not on file      Non-medical: Not on file  Tobacco Use  . Smoking status: Never Smoker  . Smokeless tobacco: Never Used  Substance and Sexual Activity  . Alcohol use: Yes    Alcohol/week: 0.0 standard drinks    Comment: rare  . Drug use: No  . Sexual activity: Not on file  Lifestyle  . Physical activity    Days per week: Not on file    Minutes per session: Not on file  . Stress: Not on file  Relationships  . Social Herbalist on phone: Not on file    Gets together: Not on file    Attends religious service: Not on file    Active member of club or organization: Not on file    Attends meetings of clubs or organizations: Not on file    Relationship status: Not on file  . Intimate partner violence    Fear of current or ex partner: Not on file    Emotionally abused: Not on file    Physically abused: Not on file    Forced sexual activity: Not on file  Other Topics Concern  . Not on file  Social History  Narrative   Lives in [place] half of the time          Allergies as of 03/13/2019      Reactions   Gabapentin Swelling   Edema, legs    Iodine Hives   Iohexol    Nsaids Other (See Comments)   REACTION: hallucinations Tolerates Meloxicam    Tolmetin Other (See Comments)   REACTION: hallucinations   Aciphex [rabeprazole] Rash   Ioxaglate Rash, Hives   Ivp Dye [iodinated Diagnostic Agents] Hives, Rash   Nexium [esomeprazole Magnesium] Rash   Pepcid [famotidine] Rash   Protonix [pantoprazole Sodium] Rash      Medication List       Accurate as of March 13, 2019 11:59 PM. If you have any questions, ask your nurse or doctor.        Aspercreme Lidocaine 4 % Ptch Generic drug: Lidocaine Apply topically.   aspirin EC 81 MG tablet Take 81 mg by mouth daily.   carbidopa-levodopa 25-250 MG disintegrating tablet Commonly known as: PARCOPA Take 1-2 tablets by mouth at bedtime.   clopidogrel 75 MG tablet Commonly known as: PLAVIX Take 75 mg by mouth daily.   Cyanocobalamin 1000 MCG/ML Kit Inject as directed.   glucose blood test strip Commonly known as: Accu-Chek Compact Plus CHECK BLOOD SUGARS NO MORE THAN TWICE DAILY DX E11.9   glucose blood test strip Commonly known as: Accu-Chek Guide Use to test blood sugar twice daily- Dx code E11.9   insulin aspart 100 UNIT/ML FlexPen Commonly known as: NovoLOG FlexPen 6-8 units before meals when sugars are over 200   Insulin Pen Needle 31G X 6 MM Misc Use to inject insulin   Lantus SoloStar 100 UNIT/ML Solostar Pen Generic drug: Insulin Glargine INJECT 35 UNITS INTO THE SKIN DAILY .   levothyroxine 100 MCG tablet Commonly known as: SYNTHROID Take 1 tablet (100 mcg total) by mouth daily.   lisinopril 5 MG tablet Commonly known as: ZESTRIL Take 5 mg by mouth daily.   meloxicam 7.5 MG tablet Commonly known as: MOBIC Take 1 tablet (7.5 mg total) by mouth daily as needed for pain.   metFORMIN 500 MG 24 hr tablet  Commonly known as: GLUCOPHAGE-XR TAKE 4 TABLETS (2,000 MG TOTAL) DAILY WITH SUPPER BY MOUTH. What changed:   how much to take  when to take this   milk thistle 175 MG tablet Take 175 mg by mouth daily.   ondansetron 4 MG tablet Commonly known as: ZOFRAN Take 1 tablet (4 mg total) by mouth every 8 (eight) hours as needed for nausea or vomiting.   OneTouch Delica Lancets Misc Check once daily.   Accu-Chek FastClix Lancets Misc Use to test blood twice daily- Dx code E11.9   traMADol 50 MG tablet Commonly known as: ULTRAM Take 2 tablets by mouth every 6 (six) hours as needed for moderate pain.   triamterene-hydrochlorothiazide 37.5-25 MG tablet Commonly known as: MAXZIDE-25 Take 1 tablet by mouth daily.   zolpidem 10 MG tablet Commonly known as: AMBIEN Take 0.5-1 tablets (5-10 mg total) by mouth at bedtime as needed for sleep.           Objective:   Physical Exam BP (!) 127/55 (BP Location: Left Arm, Patient Position: Sitting, Cuff Size: Normal)   Pulse 85   Temp 97.6 F (36.4 C) (Temporal)   Resp 16   Ht 5' 4" (1.626 m)   Wt 152 lb 2 oz (69 kg)   SpO2 96%   BMI 26.11 kg/m  General:   Well developed, NAD, BMI noted. HEENT:  Normocephalic . Face symmetric, atraumatic.  Not pale   lungs:  CTA B Normal respiratory effort, no intercostal retractions, no accessory muscle use. Heart: RRR, soft, mild systolic murmur.  No pretibial edema bilaterally  Skin: Not pale. Not jaundice Neurologic:  alert & oriented X3.  Speech normal, gait appropriate for age and unassisted Psych--  Cognition and judgment appear intact.  Cooperative with normal attention span and concentration.  Behavior appropriate. No anxious or depressed appearing.      Assessment      Assessment DM -- used to see Dr Kumar HTN Hyperlipidemia- not on meds  Hypothyroidism Anxiety , insomnia CV: S/p Tvar 12/2018 @ [facility] Chronic fatigue (rx empiric B12 shot 12-2014) RLS  MSK: on ultram per  pain mngmt --Spinal cervical stenosis, local injections, surgery 12-2012 --Low back pain-- Dr Ramos --DJD  --osteoporosis see phone note 01/2016  2010: T score -1.5 (femur)  2015: T score -1.9 (femur) 02/08/2016: T score -1.6 (femur)  04/09/2018: T score -1.3. Started Prolia approximately 05-2011 GI: used to see Dr Stark, last OV 10-2014 , now Dr Fina  --GERD, --Chronic pancreatitis, h/o episodes of acute pancreatitis --h/o fatty liver and ? Of early cirrhosis by MRI --Chronic constipation --Nausea, vomiting, eval in [place]~ 12- 2016: Delayed gastric emptying, EGD biopsy no H. Pylori,SIBO +, Rx ABX Breast cancer dx 2008 XRT H/o +PPD H/o Urticaria H/ o Mild aortic valve insufficiency  PLAN: Aortic stenosis: Status post TVAR, trace syst murmur today?; doing physical therapy, more energetic, feeling well. Per cardiology note, she was told okay to stop Plavix. HTN: Seems well controlled, continue lisinopril, Maxide.  Last BMP satisfactory Insomnia: Refill Ambien, advised provided regards good habits. B12 deficiency: Shot today Mild anemia: Last hemoglobin is slightly low 2 months ago, she will see her other primary doctor in [place] next month, advised her to check with her.  No GI symptoms. Osteoporosis: Last DEXA was done in [place], T score -1.3, plan Prolia.  Improving.   RTC 6 months   

## 2019-03-15 NOTE — Assessment & Plan Note (Addendum)
Aortic stenosis: Status post TVAR, trace syst murmur today?; doing physical therapy, more energetic, feeling well. Per cardiology note, she was told okay to stop Plavix. HTN: Seems well controlled, continue lisinopril, Maxide.  Last BMP satisfactory Insomnia: Refill Ambien, advised provided regards good habits. B12 deficiency: Shot today Mild anemia: Last hemoglobin is slightly low 2 months ago, she will see her other primary doctor in [place] next month, advised her to check with her.  No GI symptoms. Osteoporosis: Last DEXA was done in [place], T score -1.3, plan Prolia.  Improving.   RTC 6 months

## 2019-04-10 ENCOUNTER — Other Ambulatory Visit: Payer: Self-pay

## 2019-04-10 MED ORDER — INSULIN PEN NEEDLE 31G X 6 MM MISC: 3 refills | Status: AC

## 2019-06-12 ENCOUNTER — Encounter: Payer: Self-pay | Admitting: Internal Medicine

## 2019-06-12 ENCOUNTER — Other Ambulatory Visit: Payer: Self-pay | Admitting: Internal Medicine

## 2019-06-23 ENCOUNTER — Telehealth: Payer: Self-pay

## 2019-06-23 NOTE — Telephone Encounter (Signed)
Letter faxed to number provided. Spoke w/ Pt- informed letter faxed. Fax confirmation received.

## 2019-06-23 NOTE — Telephone Encounter (Signed)
Patient called in needing  Written consent from Dr. Larose Kells stating that she received her (Prolia Shot) on   01/13/2019  Please send this information to the [facility] in [place] fax number is [phone]) please put on there Att: [facility].  Patient travels back and forth from [place] to [facility]. The [facility] office has changed there procedures and policies  and the office manger asked the patient to get a letter from her Dr. Edison Simon that she has rec the shot before.   Please follow up with the patient and let her know that the fax was sent. Call patient at [phone]    Thanks.

## 2019-07-09 LAB — HM DIABETES EYE EXAM

## 2019-07-23 LAB — HM DIABETES EYE EXAM

## 2019-09-02 ENCOUNTER — Telehealth: Payer: Self-pay | Admitting: Endocrinology

## 2019-09-02 NOTE — Telephone Encounter (Signed)
Have you contacted your pharmacy to initiate the refill request? Yes - they told patient to call us          If no then please ask them to call the pharmacy to start the request but if they insist proceed to next questions  What is the name of the medication/medications? lantus solostar        If it is for test strips or lancets please confirm the brand  Is this for a 90 day? yes  What is the name and location of the pharmacy you would like to use?    [facility]  [address] [place], [place]  [address]  Ph#  [phone]

## 2019-09-03 NOTE — Telephone Encounter (Signed)
Patient has not been seen since 2019. Does she need to be seen?

## 2019-09-03 NOTE — Telephone Encounter (Signed)
Refuse refill with note to make appointment

## 2019-09-03 NOTE — Telephone Encounter (Signed)
Please schedule pt for soonest available.

## 2019-09-03 NOTE — Telephone Encounter (Signed)
Patient scheduled.

## 2019-09-04 ENCOUNTER — Other Ambulatory Visit: Payer: Self-pay

## 2019-09-04 MED ORDER — LANTUS SOLOSTAR 100 UNIT/ML ~~LOC~~ SOPN: PEN_INJECTOR | SUBCUTANEOUS | 0 refills | Status: DC

## 2019-09-04 NOTE — Telephone Encounter (Signed)
Patient calling back for refill on Lantus patient is already scheduled

## 2019-09-04 NOTE — Telephone Encounter (Signed)
Rx sent 

## 2019-09-09 ENCOUNTER — Other Ambulatory Visit: Payer: Self-pay | Admitting: Endocrinology

## 2019-09-11 ENCOUNTER — Ambulatory Visit (INDEPENDENT_AMBULATORY_CARE_PROVIDER_SITE_OTHER): Payer: Medicare Other | Admitting: Internal Medicine

## 2019-09-11 ENCOUNTER — Other Ambulatory Visit: Payer: Medicare Other

## 2019-09-11 ENCOUNTER — Other Ambulatory Visit: Payer: Self-pay

## 2019-09-11 ENCOUNTER — Encounter: Payer: Self-pay | Admitting: Internal Medicine

## 2019-09-11 VITALS — BP 131/73 | HR 77 | Temp 98.3°F | Resp 16 | Ht 64.0 in | Wt 156.0 lb

## 2019-09-11 DIAGNOSIS — E785 Hyperlipidemia, unspecified: Secondary | ICD-10-CM

## 2019-09-11 DIAGNOSIS — I10 Essential (primary) hypertension: Secondary | ICD-10-CM

## 2019-09-11 DIAGNOSIS — E038 Other specified hypothyroidism: Secondary | ICD-10-CM

## 2019-09-11 DIAGNOSIS — M25561 Pain in right knee: Secondary | ICD-10-CM | POA: Diagnosis not present

## 2019-09-11 DIAGNOSIS — D649 Anemia, unspecified: Secondary | ICD-10-CM

## 2019-09-11 DIAGNOSIS — G8929 Other chronic pain: Secondary | ICD-10-CM

## 2019-09-11 MED ORDER — ONDANSETRON HCL 4 MG PO TABS: 4.0000 mg | ORAL_TABLET | Freq: Three times a day (TID) | ORAL | 1 refills | Status: DC | PRN

## 2019-09-11 NOTE — Progress Notes (Signed)
Pre visit review using our clinic review tool, if applicable. No additional management support is needed unless otherwise documented below in the visit note. 

## 2019-09-11 NOTE — Patient Instructions (Addendum)
Please schedule Medicare Wellness with Glenard Haring.   Per our records you are due for an eye exam. Please contact your eye doctor to schedule an appointment. Please have them send copies of your office visit notes to [place]. Our fax number is [phone].  Check your blood pressures regularly BP GOAL is between 110/65 and  135/85. If it is consistently higher or lower, let me know  Please remember I am not tracking your immunizations, colonoscopies, mammographies.     GO TO THE LAB : Get the blood work     [place], [place] back for a checkup in 1 year.

## 2019-09-11 NOTE — Progress Notes (Signed)
Subjective:    Patient ID: Sharon Armstrong, female    DOB: March 07, 1941, 79 y.o.   MRN: [id number]  DOS:  09/11/2019 Type of visit - description: Routine checkup Has few concerns  Since he had a fracture at the right knee she is having problems, request a second opinion  BP was elevated in [place], adjustment to her medications were done.  Has chronic nausea, requests Zofran refill.  Had a fall 4 to 5 weeks ago in her bathtub, does not remember how she landed, since then is having pain at the base of the left neck without radiation. No paresthesias, no LOC.  Good compliance with Synthroid.  Review of Systems See above   Past Medical History:  Diagnosis Date  . Adenomatous polyps 11/1998  . Anxiety   . Aortic valve insufficiency    mild  . Breast cancer ([place]) 04/2007   s/p XRT (last 4/09)  . Cervical myelopathy ([place])   . Cervical stenosis of spine    sees neuro surgery and dr.ramons s/p local injection 4/09  . Chronic constipation   . Chronic pancreatitis ([place]) 09/29/2014  . Colon polyps 02/2010   Tubular  Adenomas                                     . Diabetes mellitus   . Difficulty in urination    pt uses cath at times  . Diverticulosis of colon   . Elevated liver function tests    fatty liver per CT 2009  . Fatty liver   . Full body hives    if patient does not take Fexofenadine  . Gastroparesis 10/2015   gastric emptying abnormal for 4 hours  . GERD (gastroesophageal reflux disease)   . Hemorrhoids   . Hyperlipemia   . Hypertension   . Hypothyroidism   . Insomnia   . Lumbar spondylosis    Lumbar epidural steroid injection L5-S1 to the left, Dr. Nelva Bush  . Mitral valve regurgitation   . Osteoarthritis   . 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 Restless leg syndrome   . Urticaria 06/2009    Past Surgical History:  Procedure Laterality Date  . ANTERIOR CERVICAL DECOMP/DISCECTOMY FUSION N/A 01/08/2013   Procedure: Cervical three-four, four-five,  five-six anterior cervical decompression with fusion plating and bonegraft;  Surgeon: Eustace Moore, MD;  Location: [facility];  Service: Neurosurgery;  Laterality: N/A;  Cervical three-four, four-five, five-six anterior cervical decompression with fusion plating and bonegraft  . APPENDECTOMY    . BREAST LUMPECTOMY Left 2009   left breast  . CARDIAC CATHETERIZATION  2001  . CATARACT EXTRACTION, BILATERAL  2/11  . EYE SURGERY    . GANGLION CYST EXCISION Right    wrist  . sinus cyst removed    . TRANSCATHETER AORTIC VALVE REPLACEMENT, TRANSAORTIC  01/09/2019  . UTERINE FIBROID SURGERY      Allergies as of 09/11/2019      Reactions   Gabapentin Swelling   Edema, legs    Iodine Hives   Iohexol    Nsaids Other (See Comments)   REACTION: hallucinations Tolerates Meloxicam    Tolmetin Other (See Comments)   REACTION: hallucinations   Aciphex [rabeprazole] Rash   Ioxaglate Rash, Hives   Ivp Dye [iodinated Diagnostic Agents] Hives, Rash   Nexium [esomeprazole Magnesium] Rash   Pepcid [famotidine] Rash   Protonix [  pantoprazole Sodium] Rash      Medication List       Accurate as of September 11, 2019 11:59 PM. If you have any questions, ask your nurse or doctor.        Accu-Chek FastClix Lancets Misc Use to test blood twice daily- Dx code E11.9 What changed: Another medication with the same name was removed. Continue taking this medication, and follow the directions you see here. Changed by: Kathlene November, MD   Aspercreme Lidocaine 4 % Ptch Generic drug: Lidocaine Apply topically.   aspirin EC 81 MG tablet Take 81 mg by mouth daily.   carbidopa-levodopa 25-250 MG disintegrating tablet Commonly known as: PARCOPA Take 1-2 tablets by mouth at bedtime. 1 tab in Am, 1 PM, and 2 qhs   Cyanocobalamin 1000 MCG/ML Kit Inject as directed.   glucose blood test strip Commonly known as: Accu-Chek Compact Plus CHECK BLOOD SUGARS NO MORE THAN TWICE DAILY DX E11.9   glucose blood test  strip Commonly known as: Accu-Chek Guide Use to test blood sugar twice daily- Dx code E11.9   Accu-Chek Guide test strip Generic drug: glucose blood USE TO TEST BLOOD SUGAR TWICE DAILY- DX CODE E11.9   insulin aspart 100 UNIT/ML FlexPen Commonly known as: NovoLOG FlexPen 6-8 units before meals when sugars are over 200   Insulin Pen Needle 31G X 6 MM Misc Use to inject insulin up to 4 times daily.   Lantus SoloStar 100 UNIT/ML Solostar Pen Generic drug: insulin glargine INJECT 35 UNITS INTO THE SKIN DAILY .   levothyroxine 100 MCG tablet Commonly known as: SYNTHROID Take 1 tablet (100 mcg total) by mouth daily.   lisinopril 10 MG tablet Commonly known as: ZESTRIL '10mg'$  in AM and '20mg'$  at night What changed: Another medication with the same name was removed. Continue taking this medication, and follow the directions you see here. Changed by: Kathlene November, MD   meloxicam 7.5 MG tablet Commonly known as: MOBIC Take 1 tablet (7.5 mg total) by mouth daily as needed for pain.   metFORMIN 500 MG 24 hr tablet Commonly known as: GLUCOPHAGE-XR TAKE 4 TABLETS (2,000 MG TOTAL) DAILY WITH SUPPER BY MOUTH. What changed:   how much to take  when to take this   milk thistle 175 MG tablet Take 175 mg by mouth daily.   ondansetron 4 MG tablet Commonly known as: ZOFRAN Take 1 tablet (4 mg total) by mouth every 8 (eight) hours as needed for nausea or vomiting.   traMADol 50 MG tablet Commonly known as: ULTRAM Take 2 tablets by mouth every 6 (six) hours as needed for moderate pain.   triamterene-hydrochlorothiazide 37.5-25 MG tablet Commonly known as: MAXZIDE-25 Take 1 tablet by mouth daily.   zolpidem 10 MG tablet Commonly known as: AMBIEN Take 0.5-1 tablets (5-10 mg total) by mouth at bedtime as needed for sleep.          Objective:   Physical Exam BP 131/73 (BP Location: Left Arm, Patient Position: Sitting, Cuff Size: Small)   Pulse 77   Temp 98.3 F (36.8 C) (Temporal)    Resp 16   Ht '5\' 4"'$  (1.626 m)   Wt 156 lb (70.8 kg)   SpO2 97%   BMI 26.78 kg/m  General:   Well developed, NAD, BMI noted. HEENT:  Normocephalic . Face symmetric, atraumatic Neck:  No TTP at the cervical spine. Range of motion slightly decreased throughout. Lungs:  CTA B Normal respiratory effort, no intercostal retractions, no accessory muscle  use. Heart: RRR,  no murmur.  Lower extremities: no pretibial edema bilaterally  Skin: Not pale. Not jaundice Neurologic:  alert & oriented X3.  Speech normal, gait appropriate for age and unassisted Motor symmetric Psych--  Cognition and judgment appear intact.  Cooperative with normal attention span and concentration.  Behavior appropriate. No anxious or depressed appearing.      Assessment      Assessment DM -- used to see Dr Dwyane Dee HTN Hyperlipidemia- not on meds  Hypothyroidism Anxiety , insomnia CV: - S/p Tvar 12/2018 @ [place] - Carotid artery Dz dx in [place] Chronic fatigue (rx empiric B12 shot 12-2014) RLS  MSK: on ultram per pain mngmt --Spinal cervical stenosis, local injections, surgery 12-2012 --Low back pain-- Dr Nelva Bush --DJD  --osteoporosis see phone note 01/2016  2010: T score -1.5 (femur)  2015: T score -1.9 (femur) 02/08/2016: T score -1.6 (femur)  04/09/2018: T score -1.3. Started Prolia approximately 05-2011 GI: used to see Dr Fuller Plan, last OV 10-2014 , now Dr Roney Mans  --GERD, --Chronic pancreatitis, h/o episodes of acute pancreatitis --h/o fatty liver and ? Of early cirrhosis by MRI --Chronic constipation --Nausea, vomiting, eval in [place]~ 12- 2016: Delayed gastric emptying, EGD biopsy no H. Pylori,SIBO +, Rx ABX Breast cancer dx 2008 XRT H/o +PPD H/o Urticaria H/ o Mild aortic valve insufficiency  PLAN: The patient returned from [place] a few days ago. DM: Per Endo HTN: While in [place], BP was elevated, they increase doses of lisinopril and she continue Maxide, BP seems to be well today.   Check a CMP High cholesterol: Check a FLP today. Hypothyroidism: Check a TSH Cardiovascular: Saw her cardiologist in [place] (no records) and plans to see her cardiologist at [facility] in few days. While in [place] was diagnosed with carotid artery disease via ultrasound. GI: History of GERD, chronic nausea, request refill Zofran.  Sent. Right knee pain: Ongoing problem after a fracture, request a second opinion, refer to [facility] Neck pain: Had a fall 4 weeks ago, since then is having pain at the base of the left neck, no red flag symptoms, recommend to see Dr. Sherley Bounds, he has operated on her before. Osteoporosis: Recent Prolia done in [place] Preventive care: As discussed before, I am not in charge of tracking immunizations, mammograms and other screenings.  She has told me that is done in [place].  The patient is reminded about that. RTC 1 year   This visit occurred during the SARS-CoV-2 public health emergency.  Safety protocols were in place, including screening questions prior to the visit, additional usage of staff PPE, and extensive cleaning of exam room while observing appropriate contact time as indicated for disinfecting solutions.

## 2019-09-13 NOTE — Assessment & Plan Note (Signed)
The patient returned from [place] a few days ago. DM: Per Endo HTN: While in [place], BP was elevated, they increase doses of lisinopril and she continue Maxide, BP seems to be well today.  Check a CMP High cholesterol: Check a FLP today. Hypothyroidism: Check a TSH Cardiovascular: Saw her cardiologist in [place] (no records) and plans to see her cardiologist at [facility] in few days. While in [place] was diagnosed with carotid artery disease via ultrasound. GI: History of GERD, chronic nausea, request refill Zofran.  Sent. Right knee pain: Ongoing problem after a fracture, request a second opinion, refer to [facility] Neck pain: Had a fall 4 weeks ago, since then is having pain at the base of the left neck, no red flag symptoms, recommend to see Dr. Sherley Bounds, he has operated on her before. Osteoporosis: Recent Prolia done in [place] Preventive care: As discussed before, I am not in charge of tracking immunizations, mammograms and other screenings.  She has told me that is done in [place].  The patient is reminded about that. RTC 1 year

## 2019-09-15 ENCOUNTER — Other Ambulatory Visit (INDEPENDENT_AMBULATORY_CARE_PROVIDER_SITE_OTHER): Payer: Medicare Other

## 2019-09-15 ENCOUNTER — Telehealth: Payer: Self-pay | Admitting: Internal Medicine

## 2019-09-15 ENCOUNTER — Other Ambulatory Visit: Payer: Self-pay

## 2019-09-15 ENCOUNTER — Other Ambulatory Visit: Payer: Self-pay | Admitting: Endocrinology

## 2019-09-15 ENCOUNTER — Other Ambulatory Visit: Payer: Self-pay | Admitting: Emergency Medicine

## 2019-09-15 DIAGNOSIS — Z794 Long term (current) use of insulin: Secondary | ICD-10-CM

## 2019-09-15 DIAGNOSIS — E063 Autoimmune thyroiditis: Secondary | ICD-10-CM | POA: Diagnosis not present

## 2019-09-15 DIAGNOSIS — E1165 Type 2 diabetes mellitus with hyperglycemia: Secondary | ICD-10-CM

## 2019-09-15 DIAGNOSIS — E782 Mixed hyperlipidemia: Secondary | ICD-10-CM

## 2019-09-15 DIAGNOSIS — Z79899 Other long term (current) drug therapy: Secondary | ICD-10-CM

## 2019-09-15 LAB — URINALYSIS, ROUTINE W REFLEX MICROSCOPIC
Bilirubin Urine: NEGATIVE
Hgb urine dipstick: NEGATIVE
Leukocytes,Ua: NEGATIVE
Nitrite: NEGATIVE
RBC / HPF: NONE SEEN (ref 0–?)
Specific Gravity, Urine: 1.025 (ref 1.000–1.030)
Total Protein, Urine: NEGATIVE
Urine Glucose: NEGATIVE
Urobilinogen, UA: 0.2 (ref 0.0–1.0)
pH: 5.5 (ref 5.0–8.0)

## 2019-09-15 LAB — MICROALBUMIN / CREATININE URINE RATIO
Creatinine,U: 173.5 mg/dL
Microalb Creat Ratio: 1.1 mg/g (ref 0.0–30.0)
Microalb, Ur: 2 mg/dL — ABNORMAL HIGH (ref 0.0–1.9)

## 2019-09-15 LAB — COMPREHENSIVE METABOLIC PANEL
ALT: 8 U/L (ref 0–35)
AST: 33 U/L (ref 0–37)
Albumin: 3.9 g/dL (ref 3.5–5.2)
Alkaline Phosphatase: 48 U/L (ref 39–117)
BUN: 30 mg/dL — ABNORMAL HIGH (ref 6–23)
CO2: 31 mEq/L (ref 19–32)
Calcium: 9.6 mg/dL (ref 8.4–10.5)
Chloride: 101 mEq/L (ref 96–112)
Creatinine, Ser: 0.82 mg/dL (ref 0.40–1.20)
GFR: 67.35 mL/min (ref >60.00)
Glucose, Bld: 159 mg/dL — ABNORMAL HIGH (ref 70–99)
Potassium: 4.2 mEq/L (ref 3.5–5.1)
Sodium: 140 mEq/L (ref 135–145)
Total Bilirubin: 0.5 mg/dL (ref 0.2–1.2)
Total Protein: 7 g/dL (ref 6.0–8.3)

## 2019-09-15 LAB — LIPID PANEL
Cholesterol: 117 mg/dL (ref 0–200)
HDL: 40.4 mg/dL (ref >39.00)
LDL Cholesterol: 56 mg/dL (ref 0–99)
NonHDL: 76.28
Total CHOL/HDL Ratio: 3
Triglycerides: 100 mg/dL (ref 0.0–149.0)
VLDL: 20 mg/dL (ref 0.0–40.0)

## 2019-09-15 LAB — HEMOGLOBIN A1C: Hgb A1c MFr Bld: 7.3 % — ABNORMAL HIGH (ref 4.6–6.5)

## 2019-09-15 LAB — T4, FREE: Free T4: 1.07 ng/dL (ref 0.60–1.60)

## 2019-09-15 LAB — TSH: TSH: 4.95 u[IU]/mL — ABNORMAL HIGH (ref 0.35–4.50)

## 2019-09-15 MED ORDER — SHINGRIX 50 MCG/0.5ML IM SUSR: 0.5000 mL | Freq: Once | INTRAMUSCULAR | 1 refills | Status: AC

## 2019-09-15 NOTE — Telephone Encounter (Signed)
One of her doctors in [place] requested following labs: -LFTs -Total CK DX hyperlipidemia and long-term drug therapy -Lipid panel, DX high cholesterol I noticed that endocrinology already ordered most labs. Please add total CK and draw labs.

## 2019-09-15 NOTE — Telephone Encounter (Signed)
Orders placed.

## 2019-09-15 NOTE — Addendum Note (Signed)
Addended by: Caffie Pinto on: 09/15/2019 03:17 PM   Modules accepted: Orders

## 2019-09-15 NOTE — Addendum Note (Signed)
Addended by: Caffie Pinto on: 09/15/2019 10:13 AM   Modules accepted: Orders

## 2019-09-15 NOTE — Addendum Note (Signed)
Addended by: Caffie Pinto on: 09/15/2019 03:15 PM   Modules accepted: Orders

## 2019-09-16 ENCOUNTER — Other Ambulatory Visit (INDEPENDENT_AMBULATORY_CARE_PROVIDER_SITE_OTHER): Payer: Medicare Other

## 2019-09-16 DIAGNOSIS — D649 Anemia, unspecified: Secondary | ICD-10-CM

## 2019-09-16 DIAGNOSIS — Z79899 Other long term (current) drug therapy: Secondary | ICD-10-CM | POA: Diagnosis not present

## 2019-09-16 LAB — CBC WITH DIFFERENTIAL/PLATELET
Basophils Absolute: 0 10*3/uL (ref 0.0–0.1)
Basophils Relative: 0.6 % (ref 0.0–3.0)
Eosinophils Absolute: 0 10*3/uL (ref 0.0–0.7)
Eosinophils Relative: 0 % (ref 0.0–5.0)
HCT: 32.7 % — ABNORMAL LOW (ref 36.0–46.0)
Hemoglobin: 10.7 g/dL — ABNORMAL LOW (ref 12.0–15.0)
Lymphocytes Relative: 42.5 % (ref 12.0–46.0)
Lymphs Abs: 2 10*3/uL (ref 0.7–4.0)
MCHC: 32.7 g/dL (ref 30.0–36.0)
MCV: 92.2 fl (ref 78.0–100.0)
Monocytes Absolute: 0.4 10*3/uL (ref 0.1–1.0)
Monocytes Relative: 8.8 % (ref 3.0–12.0)
Neutro Abs: 2.2 10*3/uL (ref 1.4–7.7)
Neutrophils Relative %: 48.1 % (ref 43.0–77.0)
Platelets: 132 10*3/uL — ABNORMAL LOW (ref 150.0–400.0)
RBC: 3.54 Mil/uL — ABNORMAL LOW (ref 3.87–5.11)
RDW: 14.4 % (ref 11.5–15.5)
WBC: 4.6 10*3/uL (ref 4.0–10.5)

## 2019-09-16 LAB — CK: Total CK: 123 U/L (ref 7–177)

## 2019-09-17 ENCOUNTER — Ambulatory Visit (INDEPENDENT_AMBULATORY_CARE_PROVIDER_SITE_OTHER): Payer: Medicare Other | Admitting: Endocrinology

## 2019-09-17 ENCOUNTER — Other Ambulatory Visit: Payer: Self-pay

## 2019-09-17 ENCOUNTER — Encounter: Payer: Self-pay | Admitting: Endocrinology

## 2019-09-17 VITALS — BP 124/84 | HR 116 | Ht 64.0 in | Wt 157.4 lb

## 2019-09-17 DIAGNOSIS — E782 Mixed hyperlipidemia: Secondary | ICD-10-CM

## 2019-09-17 DIAGNOSIS — E1165 Type 2 diabetes mellitus with hyperglycemia: Secondary | ICD-10-CM

## 2019-09-17 DIAGNOSIS — Z794 Long term (current) use of insulin: Secondary | ICD-10-CM

## 2019-09-17 DIAGNOSIS — E1142 Type 2 diabetes mellitus with diabetic polyneuropathy: Secondary | ICD-10-CM

## 2019-09-17 MED ORDER — NOVOLOG FLEXPEN 100 UNIT/ML ~~LOC~~ SOPN: PEN_INJECTOR | SUBCUTANEOUS | 0 refills | Status: DC

## 2019-09-17 NOTE — Patient Instructions (Signed)
Check blood sugars on waking up 3 days a week  Also check blood sugars about 2 hours after meals and do this after different meals by rotation  Recommended blood sugar levels on waking up are 90-130 and about 2 hours after meal is 130-180  Please bring your blood sugar monitor to each visit, thank you  Always take Novolog before supper AND for large carb meals at other times

## 2019-09-17 NOTE — Progress Notes (Signed)
Patient ID: Sharon Armstrong, female   DOB: 06/07/1940, 79 y.o.   MRN: [id number]           Reason for Appointment: f/u  for Type 2 Diabetes  Referring physician: Larose Kells  History of Present Illness:       Background history: She had been treated for several years with metformin and Amaryl  Her control had been fairly good until about 2012 and she thinks that because of poor control with oral agents she was given Lantus insulin in addition She has been on Lantus and metformin since then with variable control  Recent history:       Insulin regimen is described as: 35 units of Lantus in am, NovoLog as needed   Oral hypoglycemic drugs the patient is taking are: Metformin ER 1 g twice a day  She has not been seen in follow-up for nearly 2 years now  Her A1c is 7.3, previously 6.5, Her previous range 6.1-7.1  Current blood sugar patterns, problems identified:  She has not followed up regularly again  She does not appear to have higher readings compared to previously and not clear why  Her blood sugar monitoring has been somewhat irregular and has only occasional blood sugars in the mornings  Most of her blood sugars are midday or afternoon and after dinner  She Has fairly consistent HYPERGLYCEMIA after dinner with blood sugars as high as 307  She has not had any steroids to cause her high sugars  Because of her high sugar occasionally she will take NovoLog but only when the blood sugar goes up after eating  With this extra dose she ended up having hypoglycemia at about 2 AM with blood sugar of 53  Also her NovoLog is an old prescription not expired  She has continued the same dose of Lantus which she takes a couple of hours after breakfast  Taking Metformin in the morning regularly without side effects  As before she is generally eating smaller meals because of nausea  She does some walking when she is in [place] but not when she comes here  Weight has gone up since last  year  She thinks she is still generally eating limited portions and calories Fasting blood sugars checked only a couple of times and are near normal, occasionally may not check her first blood sugar till noon time  She lives 6 months in [place] and has a physician there also      Side effects from medications have been: None  Compliance with the medical regimen: Good   Glucose monitoring:  done about 1  times a day         Glucometer:  Accucheck    Blood Glucose readings by review of meter download:   PRE-MEAL Fasting  afternoon Dinner Bedtime Overall  Glucose range:  101-133  190-276     Mean/median:      173   POST-MEAL PC Breakfast PC Lunch PC Dinner  Glucose range:    164-307  Mean/median:                   Previous readings:   PRE-MEAL Fasting Lunch Dinner Bedtime Overall  Glucose range:  95-158   100   90-197  Mean/median:  118     126   POST-MEAL PC Breakfast PC Lunch PC Dinner  Glucose range:   112-197  177  Mean/median:         Diet consultation was in 6/16  Exercise: not walking recently  Weight history:    Wt Readings from Last 3 Encounters:  09/17/19 157 lb 6.4 oz (71.4 kg)  09/11/19 156 lb (70.8 kg)  03/13/19 152 lb 2 oz (69 kg)    Glycemic control:   Lab Results  Component Value Date   HGBA1C 7.3 (H) 09/15/2019   HGBA1C 7.0 01/01/2019   HGBA1C 6.5 (A) 12/23/2017   Lab Results  Component Value Date   MICROALBUR 2.0 (H) 09/15/2019   LDLCALC 56 09/15/2019   CREATININE 0.82 09/15/2019       Allergies as of 09/17/2019      Reactions   Gabapentin Swelling   Edema, legs    Iodine Hives   Iohexol    Nsaids Other (See Comments)   REACTION: hallucinations Tolerates Meloxicam    Tolmetin Other (See Comments)   REACTION: hallucinations   Aciphex [rabeprazole] Rash   Ioxaglate Rash, Hives   Ivp Dye [iodinated Diagnostic Agents] Hives, Rash   Nexium [esomeprazole Magnesium] Rash   Pepcid [famotidine] Rash   Protonix  [pantoprazole Sodium] Rash      Medication List       Accurate as of September 17, 2019  4:24 PM. If you have any questions, ask your nurse or doctor.        Accu-Chek FastClix Lancets Misc Use to test blood twice daily- Dx code E11.9   Aspercreme Lidocaine 4 % Ptch Generic drug: Lidocaine Apply topically.   aspirin EC 81 MG tablet Take 81 mg by mouth daily.   carbidopa-levodopa 25-250 MG disintegrating tablet Commonly known as: PARCOPA Take 1-2 tablets by mouth at bedtime. 1 tab in Am, 1 PM, and 2 qhs   Cyanocobalamin 1000 MCG/ML Kit Inject 1,000 mcg as directed See admin instructions. Inject 1063mg IM every month and a half.   glucose blood test strip Commonly known as: Accu-Chek Compact Plus CHECK BLOOD SUGARS NO MORE THAN TWICE DAILY DX E11.9   glucose blood test strip Commonly known as: Accu-Chek Guide Use to test blood sugar twice daily- Dx code E11.9   Accu-Chek Guide test strip Generic drug: glucose blood USE TO TEST BLOOD SUGAR TWICE DAILY- DX CODE E11.9   insulin aspart 100 UNIT/ML FlexPen Commonly known as: NovoLOG FlexPen 6-8 units before meals when sugars are over 200   Insulin Pen Needle 31G X 6 MM Misc Use to inject insulin up to 4 times daily.   Lantus SoloStar 100 UNIT/ML Solostar Pen Generic drug: insulin glargine INJECT 35 UNITS INTO THE SKIN DAILY .   levothyroxine 100 MCG tablet Commonly known as: SYNTHROID Take 1 tablet (100 mcg total) by mouth daily.   lisinopril 10 MG tablet Commonly known as: ZESTRIL 153min AM and 2070mt night   meloxicam 7.5 MG tablet Commonly known as: MOBIC Take 1 tablet (7.5 mg total) by mouth daily as needed for pain.   metFORMIN 1000 MG tablet Commonly known as: GLUCOPHAGE Take 1,000 mg by mouth 2 (two) times daily with a meal. Take 1 tablet by mouth twice daily. What changed: Another medication with the same name was removed. Continue taking this medication, and follow the directions you see here.  Changed by: AjaElayne SnareD   milk thistle 175 MG tablet Take 175 mg by mouth daily.   ondansetron 4 MG tablet Commonly known as: ZOFRAN Take 1 tablet (4 mg total) by mouth every 8 (eight) hours as needed for nausea or vomiting.   traMADol 50 MG tablet Commonly known as: ULTVeatrice [place]bon  Take 2 tablets by mouth every 6 (six) hours as needed for moderate pain.   triamterene-hydrochlorothiazide 37.5-25 MG tablet Commonly known as: MAXZIDE-25 Take 1 tablet by mouth daily.   zolpidem 10 MG tablet Commonly known as: AMBIEN Take 0.5-1 tablets (5-10 mg total) by mouth at bedtime as needed for sleep.       Allergies:  Allergies  Allergen Reactions  . Gabapentin Swelling    Edema, legs   . Iodine Hives  . Iohexol   . Nsaids Other (See Comments)    REACTION: hallucinations Tolerates Meloxicam   . Tolmetin Other (See Comments)    REACTION: hallucinations  . Aciphex [Rabeprazole] Rash  . Ioxaglate Rash and Hives  . Ivp Dye [Iodinated Diagnostic Agents] Hives and Rash  . Nexium [Esomeprazole Magnesium] Rash  . Pepcid [Famotidine] Rash  . Protonix [Pantoprazole Sodium] Rash    Past Medical History:  Diagnosis Date  . Adenomatous polyps 11/1998  . Anxiety   . Aortic valve insufficiency    mild  . Breast cancer ([place]) 04/2007   s/p XRT (last 4/09)  . Cervical myelopathy ([place])   . Cervical stenosis of spine    sees neuro surgery and dr.ramons s/p local injection 4/09  . Chronic constipation   . Chronic pancreatitis ([place]) 09/29/2014  . Colon polyps 02/2010   Tubular  Adenomas                                     . Diabetes mellitus   . Difficulty in urination    pt uses cath at times  . Diverticulosis of colon   . Elevated liver function tests    fatty liver per CT 2009  . Fatty liver   . Full body hives    if patient does not take Fexofenadine  . Gastroparesis 10/2015   gastric emptying abnormal for 4 hours  . GERD (gastroesophageal reflux disease)   . Hemorrhoids   .  Hyperlipemia   . Hypertension   . Hypothyroidism   . Insomnia   . Lumbar spondylosis    Lumbar epidural steroid injection L5-S1 to the left, Dr. Nelva Bush  . Mitral valve regurgitation   . Osteoarthritis   . 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 Restless leg syndrome   . Urticaria 06/2009    Past Surgical History:  Procedure Laterality Date  . ANTERIOR CERVICAL DECOMP/DISCECTOMY FUSION N/A 01/08/2013   Procedure: Cervical three-four, four-five, five-six anterior cervical decompression with fusion plating and bonegraft;  Surgeon: Eustace Moore, MD;  Location: [facility];  Service: Neurosurgery;  Laterality: N/A;  Cervical three-four, four-five, five-six anterior cervical decompression with fusion plating and bonegraft  . APPENDECTOMY    . BREAST LUMPECTOMY Left 2009   left breast  . CARDIAC CATHETERIZATION  2001  . CATARACT EXTRACTION, BILATERAL  2/11  . EYE SURGERY    . GANGLION CYST EXCISION Right    wrist  . sinus cyst removed    . TRANSCATHETER AORTIC VALVE REPLACEMENT, TRANSAORTIC  01/09/2019  . UTERINE FIBROID SURGERY      Family History  Problem Relation Age of Onset  . Hypertension Mother   . Lung cancer Mother        mets to brain  . Brain cancer Mother   . Tuberculosis Father        died at 59  . Coronary artery disease Neg Hx   .  Diabetes Neg Hx   . Stroke Neg Hx   . Colon cancer Neg Hx   . Breast cancer Neg Hx     Social History:  reports that she has never smoked. She has never used smokeless tobacco. She reports current alcohol use. She reports that she does not use drugs.    Review of Systems    Lipid history: Usually has LDL below 100 Not on a statin drug    Lab Results  Component Value Date   CHOL 117 09/15/2019   HDL 40.40 09/15/2019   LDLCALC 56 09/15/2019   TRIG 100.0 09/15/2019   CHOLHDL 3 09/15/2019            Hypertension: She is on lisinopril 10 mg and same dose of Maxzide prescribed by her PCP with good control   Gastrointestinal: History of  Cirrhosis followed by gastroenterologist regularly   Has ?  gastroparesis with history of nausea with vomiting and is on a special diet, recently told to try not taking metoclopramide   Endocrine:  Long history of thyroid disease: Was told in her teen years that she was hypothyroid and this may have been a sequela to possible radiation treatment in childhood in [place]   This is being managed by her PCP and she has an upcoming visit   Lab Results  Component Value Date   TSH 4.95 (H) 09/15/2019   TSH 4.22 01/13/2019   TSH 4.50 09/11/2018   FREET4 1.07 09/15/2019   FREET4 1.13 12/23/2017   FREET4 0.96 09/07/2016    Foot exam in 12/2017 showed:  Decreased monofilament sensation on the toes and distal plantar surfaces of the right side  Physical Examination:  BP 124/84 (BP Location: Left Arm, Patient Position: Sitting, Cuff Size: Normal)   Pulse (!) 116   Ht 5' 4" (1.626 m)   Wt 157 lb 6.4 oz (71.4 kg)   SpO2 98%   BMI 27.02 kg/m       ASSESSMENT/PLAN:   Diabetes type 2, uncontrolled    See history of present illness for detailed discussion of current diabetes management, blood sugar patterns and problems identified  Her A1c has gone up to 7.3  However recently her home blood sugars are averaging 173 and higher than usual She is on Metformin and basal insulin only and is not showing some progression of her diabetes and insulin deficiency She is getting postprandial hyperglycemia fairly regularly  Recommendations:  Today discussed in detail the need for mealtime insulin to cover postprandial spikes, action of mealtime insulin, use of the insulin pen, timing and action of the rapid acting insulin as well as starting dose and dosage titration to target the two-hour reading of under 180   She will start with at least 5 units of NovoLog before dinner and adjust the dose based on her 2-hour reading  Patient information brochure given  She will  likely need up to 8 units especially for larger meals or going out to eat  Also likely needs coverage for eating significant amount of carbohydrate at breakfast or lunch  If her morning sugars start coming down she will need to reduce her Lantus  Encouraged her to start walking regularly  More regular follow-up  LIPIDS: LDL normal without medications  HYPOTHYROIDISM: She will follow-up with her PCP regarding her high TSH level,  NEUROPATHY: She needs regular foot exam with her history of neuropathic sensory loss       There are no Patient Instructions on file for   this visit.  Elayne Snare 09/17/2019, 4:24 PM   Note: This office note was prepared with Dragon voice recognition system technology. Any transcriptional errors that result from this process are unintentional. `

## 2019-09-21 MED ORDER — LEVOTHYROXINE SODIUM 112 MCG PO TABS
112.0000 ug | ORAL_TABLET | Freq: Every day | ORAL | 0 refills | Status: DC
Start: 2019-09-21 — End: 2019-11-26

## 2019-09-30 ENCOUNTER — Other Ambulatory Visit: Payer: Self-pay

## 2019-09-30 DIAGNOSIS — I10 Essential (primary) hypertension: Secondary | ICD-10-CM

## 2019-10-05 ENCOUNTER — Telehealth: Payer: Self-pay | Admitting: Endocrinology

## 2019-10-05 NOTE — Telephone Encounter (Signed)
Amitriptyline does not cause high sugars.  She needs to go up at least 2 to 3 units on her NovoLog that she takes before eating supper and not change her Lantus

## 2019-10-05 NOTE — Telephone Encounter (Signed)
Patient requests to be called at ph# [phone] re: Patient was put on Elavil (which patient was told would raise raise her blood sugars and it has). Patient's  blood sugar was 236 last night. This morning patient's blood sugars were 130. Patient wants to know how she should set her insulin (Solostar)-Dr. Dwyane Dee has patient keeping a chart of her blood sugars which will not be accurate because of the medication listed above.

## 2019-10-05 NOTE — Telephone Encounter (Signed)
Please review and advise.

## 2019-10-05 NOTE — Telephone Encounter (Signed)
Patient notified of change in therapy.  She states that she understands.

## 2019-10-14 ENCOUNTER — Other Ambulatory Visit (INDEPENDENT_AMBULATORY_CARE_PROVIDER_SITE_OTHER): Payer: Medicare Other

## 2019-10-14 ENCOUNTER — Telehealth: Payer: Self-pay

## 2019-10-14 ENCOUNTER — Other Ambulatory Visit: Payer: Self-pay

## 2019-10-14 DIAGNOSIS — E875 Hyperkalemia: Secondary | ICD-10-CM

## 2019-10-14 DIAGNOSIS — I10 Essential (primary) hypertension: Secondary | ICD-10-CM | POA: Diagnosis not present

## 2019-10-14 DIAGNOSIS — Z1231 Encounter for screening mammogram for malignant neoplasm of breast: Secondary | ICD-10-CM

## 2019-10-14 DIAGNOSIS — M818 Other osteoporosis without current pathological fracture: Secondary | ICD-10-CM

## 2019-10-14 LAB — BASIC METABOLIC PANEL
BUN: 38 mg/dL — ABNORMAL HIGH (ref 6–23)
CO2: 24 mEq/L (ref 19–32)
Calcium: 9.9 mg/dL (ref 8.4–10.5)
Chloride: 103 mEq/L (ref 96–112)
Creatinine, Ser: 1.22 mg/dL — ABNORMAL HIGH (ref 0.40–1.20)
GFR: 42.58 mL/min — ABNORMAL LOW (ref >60.00)
Glucose, Bld: 132 mg/dL — ABNORMAL HIGH (ref 70–99)
Potassium: 5.5 mEq/L — ABNORMAL HIGH (ref 3.5–5.1)
Sodium: 137 mEq/L (ref 135–145)

## 2019-10-14 NOTE — Telephone Encounter (Signed)
Spoke w/ Pt- she dropped off orders from PCP in [facility]- they are requesting mammogram and dexa scan. [facility] to be in Dr. Larose Kells name- orders placed. To be sent to Dr. Danise Mina in [facility] once done.

## 2019-10-14 NOTE — Addendum Note (Signed)
Addended byDamita Dunnings D on: 10/14/2019 02:48 PM   Modules accepted: Orders

## 2019-10-14 NOTE — Telephone Encounter (Signed)
Patient came in to the office to drop of medical forms for Dr. Larose Kells to fill out. Please follow up with the patient once this is done at [phone]

## 2019-10-15 ENCOUNTER — Telehealth: Payer: Self-pay

## 2019-10-15 ENCOUNTER — Other Ambulatory Visit (INDEPENDENT_AMBULATORY_CARE_PROVIDER_SITE_OTHER): Payer: Medicare Other

## 2019-10-15 DIAGNOSIS — E875 Hyperkalemia: Secondary | ICD-10-CM | POA: Diagnosis not present

## 2019-10-15 LAB — BASIC METABOLIC PANEL
BUN: 29 mg/dL — ABNORMAL HIGH (ref 6–23)
CO2: 25 mEq/L (ref 19–32)
Calcium: 10.1 mg/dL (ref 8.4–10.5)
Chloride: 103 mEq/L (ref 96–112)
Creatinine, Ser: 0.92 mg/dL (ref 0.40–1.20)
GFR: 58.97 mL/min — ABNORMAL LOW (ref >60.00)
Glucose, Bld: 197 mg/dL — ABNORMAL HIGH (ref 70–99)
Potassium: 5.5 mEq/L — ABNORMAL HIGH (ref 3.5–5.1)
Sodium: 135 mEq/L (ref 135–145)

## 2019-10-15 NOTE — Telephone Encounter (Signed)
As far as GFR, compared to April is slightly decreased but not a reason to be alarmed.  Her creatinine is still very good at 0.9.

## 2019-10-15 NOTE — Telephone Encounter (Signed)
See results, likely potassium is high due to the recent addition to spironolactone by cardiology, recommend to call them. Set up a visit  if question remains after she speaks with cardiology.

## 2019-10-15 NOTE — Telephone Encounter (Signed)
Pt informed of below and PCP thoughts. Pt verbalized understanding.

## 2019-10-15 NOTE — Telephone Encounter (Signed)
Any thoughts on creatinine level and GFR? Pt was adamant I get an answer to this.

## 2019-10-15 NOTE — Telephone Encounter (Signed)
Pt and her husband came by office during computer downtime- they had multiple questions I was unable to answer at the time- I inforrmed them that I'd send questions to Dr. Larose Kells who has already left the office today when we are back in system. Pt verbalized understanding.   1. Should she be taking something for elevated potassium levels?  2. She is worried about elevated creatinine levels and GFR levels.

## 2019-10-16 ENCOUNTER — Other Ambulatory Visit: Payer: Self-pay

## 2019-10-16 DIAGNOSIS — I10 Essential (primary) hypertension: Secondary | ICD-10-CM

## 2019-10-20 ENCOUNTER — Other Ambulatory Visit: Payer: Self-pay | Admitting: Endocrinology

## 2019-10-20 ENCOUNTER — Other Ambulatory Visit (INDEPENDENT_AMBULATORY_CARE_PROVIDER_SITE_OTHER): Payer: Medicare Other

## 2019-10-20 ENCOUNTER — Encounter: Payer: Self-pay | Admitting: Internal Medicine

## 2019-10-20 DIAGNOSIS — I10 Essential (primary) hypertension: Secondary | ICD-10-CM | POA: Diagnosis not present

## 2019-10-20 LAB — BASIC METABOLIC PANEL
BUN: 38 mg/dL — ABNORMAL HIGH (ref 6–23)
CO2: 25 mEq/L (ref 19–32)
Calcium: 9.8 mg/dL (ref 8.4–10.5)
Chloride: 103 mEq/L (ref 96–112)
Creatinine, Ser: 0.92 mg/dL (ref 0.40–1.20)
GFR: 58.96 mL/min — ABNORMAL LOW (ref >60.00)
Glucose, Bld: 201 mg/dL — ABNORMAL HIGH (ref 70–99)
Potassium: 4.9 mEq/L (ref 3.5–5.1)
Sodium: 137 mEq/L (ref 135–145)

## 2019-10-25 ENCOUNTER — Other Ambulatory Visit: Payer: Self-pay | Admitting: Endocrinology

## 2019-11-03 ENCOUNTER — Telehealth: Payer: Self-pay | Admitting: Internal Medicine

## 2019-11-03 DIAGNOSIS — I10 Essential (primary) hypertension: Secondary | ICD-10-CM

## 2019-11-03 NOTE — Telephone Encounter (Signed)
That is okay, she could do it here or at [facility].  Needs to let us know who will fax the results to (probably cardiology)

## 2019-11-03 NOTE — Telephone Encounter (Signed)
Please advise 

## 2019-11-03 NOTE — Telephone Encounter (Signed)
Caller: Rakeb Call back phone number: [phone]  Patient would like her potassium to be check. Please advise

## 2019-11-04 ENCOUNTER — Ambulatory Visit: Payer: Medicare Other | Admitting: Endocrinology

## 2019-11-04 ENCOUNTER — Telehealth: Payer: Self-pay | Admitting: Internal Medicine

## 2019-11-04 NOTE — Telephone Encounter (Signed)
Patient states that she would like to go to [facility] for labs . Please switch lab order to [facility].

## 2019-11-04 NOTE — Telephone Encounter (Signed)
Order placed. Mitzo please schedule lab at [facility]. If she needs/wants to go to [facility] please let me know I'll change the order.

## 2019-11-04 NOTE — Telephone Encounter (Signed)
Order changed to [facility].

## 2019-11-04 NOTE — Telephone Encounter (Signed)
Left message for the patient to call back to schedule appointment.

## 2019-11-09 ENCOUNTER — Other Ambulatory Visit (INDEPENDENT_AMBULATORY_CARE_PROVIDER_SITE_OTHER): Payer: Medicare Other

## 2019-11-09 ENCOUNTER — Telehealth: Payer: Self-pay | Admitting: Internal Medicine

## 2019-11-09 ENCOUNTER — Telehealth: Payer: Self-pay | Admitting: Endocrinology

## 2019-11-09 DIAGNOSIS — I10 Essential (primary) hypertension: Secondary | ICD-10-CM | POA: Diagnosis not present

## 2019-11-09 LAB — BASIC METABOLIC PANEL
BUN: 35 mg/dL — ABNORMAL HIGH (ref 6–23)
CO2: 26 mEq/L (ref 19–32)
Calcium: 9.6 mg/dL (ref 8.4–10.5)
Chloride: 106 mEq/L (ref 96–112)
Creatinine, Ser: 0.93 mg/dL (ref 0.40–1.20)
GFR: 58.22 mL/min — ABNORMAL LOW (ref >60.00)
Glucose, Bld: 146 mg/dL — ABNORMAL HIGH (ref 70–99)
Potassium: 4.9 mEq/L (ref 3.5–5.1)
Sodium: 140 mEq/L (ref 135–145)

## 2019-11-09 NOTE — Telephone Encounter (Signed)
Please contact pt first and ensure that she is okay after having taken too much insulin.

## 2019-11-09 NOTE — Telephone Encounter (Signed)
error 

## 2019-11-09 NOTE — Telephone Encounter (Signed)
Spoke with the patient and this happened on Saturday night.  She was able to talk to a nurse and she helped her greatly.  She really appreciated that service.  She is feeling ok and she back on schedule with her normal regiment.  She also needed test strips due to checking so much during the time Saturday.  I advised her that test strips were sent to pharmacy on 10-26-19.

## 2019-11-09 NOTE — Telephone Encounter (Signed)
Patient spouse dropped off forms to be filled out by Dr. Larose Kells , patient would like to be notified once completed ,

## 2019-11-09 NOTE — Telephone Encounter (Signed)
Please advise 

## 2019-11-09 NOTE — Telephone Encounter (Signed)
Patient left message on after hours voicemail stating she took too much of her Novolog and would like to be called to be advised. Patient ph# [phone]

## 2019-11-10 NOTE — Telephone Encounter (Signed)
Form completed, placed in PCP red folder to be signed.  

## 2019-11-10 NOTE — Telephone Encounter (Signed)
Signed.

## 2019-11-10 NOTE — Telephone Encounter (Signed)
Spoke w/ Pt's husband- Francee Piccolo, informed form is ready for pick up. Copy sent for scanning.

## 2019-11-12 ENCOUNTER — Ambulatory Visit
Admission: RE | Admit: 2019-11-12 | Discharge: 2019-11-12 | Disposition: A | Discharge status: Home / Self Care | Payer: Medicare Other | Source: Ambulatory Visit | Attending: Internal Medicine | Admitting: Internal Medicine

## 2019-11-12 ENCOUNTER — Other Ambulatory Visit: Payer: Self-pay

## 2019-11-20 ENCOUNTER — Telehealth: Payer: Self-pay | Admitting: Internal Medicine

## 2019-11-20 NOTE — Telephone Encounter (Signed)
Spoke w/ Lilia Pro at [facility]- they are waiting for films to come in- may be several more days. I called Pt and informed her results are pending. Pt verbalized understanding.

## 2019-11-20 NOTE — Telephone Encounter (Signed)
Patient had manogram 10 days ago , and hasn't received her results. Patient would like a call back, . Please advise .

## 2019-11-26 ENCOUNTER — Ambulatory Visit (INDEPENDENT_AMBULATORY_CARE_PROVIDER_SITE_OTHER): Payer: Medicare Other | Admitting: Endocrinology

## 2019-11-26 ENCOUNTER — Encounter: Payer: Self-pay | Admitting: Endocrinology

## 2019-11-26 ENCOUNTER — Other Ambulatory Visit: Payer: Self-pay

## 2019-11-26 VITALS — BP 122/64 | HR 62 | Ht 64.0 in | Wt 156.6 lb

## 2019-11-26 DIAGNOSIS — E1165 Type 2 diabetes mellitus with hyperglycemia: Secondary | ICD-10-CM

## 2019-11-26 DIAGNOSIS — E063 Autoimmune thyroiditis: Secondary | ICD-10-CM | POA: Diagnosis not present

## 2019-11-26 DIAGNOSIS — E1142 Type 2 diabetes mellitus with diabetic polyneuropathy: Secondary | ICD-10-CM

## 2019-11-26 DIAGNOSIS — Z794 Long term (current) use of insulin: Secondary | ICD-10-CM | POA: Diagnosis not present

## 2019-11-26 NOTE — Progress Notes (Signed)
Patient ID: Sharon Armstrong, female   DOB: February 05, 1941, 79 y.o.   MRN: [id number]           Reason for Appointment: f/u  for Type 2 Diabetes  Referring physician: Larose Kells  History of Present Illness:       Background history: She had been treated for several years with metformin and Amaryl  Her control had been fairly good until about 2012 and she thinks that because of poor control with oral agents she was given Lantus insulin in addition She has been on Lantus and metformin since then with variable control  Recent history:       Insulin regimen is described as: 35 units of Lantus in am, NovoLog 5-6 units at dinnertime   Oral hypoglycemic drugs the patient is taking are: Metformin ER 1 g twice a day  She has not been seen in follow-up for nearly 2 years now  Her A1c is 7.3, previously 6.5, Her previous range 6.1-7.3  Current blood sugar patterns, problems identified:  She has been started on NovoLog before meals since her visit in 4/21 because of high blood sugars after dinner and increasing A1c  With this her blood sugars are overall somewhat better although still fluctuating  She is trying to take this 5 to 10-minute before the evening meal as directed but occasionally if not having her insulin with her may not take it until later  Blood sugars are somewhat better in the last couple of weeks  She did have an episode where she went to change her Lantus and NovoLog and took a large dose of NovoLog but did not have any hypoglycemia  She has had only one unusually high blood sugar of 326 with eating ice cream in the evening without extra insulin; then with extra 6 units correction her blood sugar came down to below 100  No nocturnal hypoglycemia  Not able to do much walking as before  The weight is about the same  She lives up to 6 months in [place] and has a physician there also       Compliance with the medical regimen: Good   Glucose monitoring:  done about 1-2 times  a day         Glucometer:  Accucheck   Blood Glucose readings by review of meter download:   PRE-MEAL Fasting Lunch Dinner Bedtime Overall  Glucose range:  95-214      Mean/median:  130    121  141   POST-MEAL PC Breakfast PC Lunch PC Dinner  Glucose range:    88-326  Mean/median:    179    Previous readings:  PRE-MEAL Fasting  afternoon Dinner Bedtime Overall  Glucose range:  101-133  190-276     Mean/median:      173   POST-MEAL PC Breakfast PC Lunch PC Dinner  Glucose range:    164-307  Mean/median:        Diet consultation was in 6/16   Weight history:    Wt Readings from Last 3 Encounters:  11/26/19 156 lb 9.6 oz (71 kg)  09/17/19 157 lb 6.4 oz (71.4 kg)  09/11/19 156 lb (70.8 kg)    Glycemic control:   Lab Results  Component Value Date   HGBA1C 7.3 (H) 09/15/2019   HGBA1C 7.0 01/01/2019   HGBA1C 6.5 (A) 12/23/2017   Lab Results  Component Value Date   MICROALBUR 2.0 (H) 09/15/2019   LDLCALC 56 09/15/2019   CREATININE 0.93 11/09/2019  Allergies as of 11/26/2019      Reactions   Gabapentin Swelling   Edema, legs    Iodine Hives   Iohexol    Nsaids Other (See Comments)   REACTION: hallucinations Tolerates Meloxicam    Spironolactone    Elevates potassium   Tolmetin Other (See Comments)   REACTION: hallucinations   Aciphex [rabeprazole] Rash   Ioxaglate Rash, Hives   Ivp Dye [iodinated Diagnostic Agents] Hives, Rash   Nexium [esomeprazole Magnesium] Rash   Pepcid [famotidine] Rash   Protonix [pantoprazole Sodium] Rash      Medication List       Accurate as of November 26, 2019 11:59 PM. If you have any questions, ask your nurse or doctor.        Accu-Chek FastClix Lancets Misc Use to test blood twice daily- 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   Aspercreme Lidocaine 4 % Ptch Generic drug: Lidocaine Apply topically.   aspirin EC 81 MG tablet Take 81 mg by mouth daily.    carbidopa-levodopa 25-250 MG disintegrating tablet Commonly known as: PARCOPA Take 1-2 tablets by mouth at bedtime. 1 tab in Am, 1 PM, and 2 qhs   Cyanocobalamin 1000 MCG/ML Kit Inject 1,000 mcg as directed See admin instructions. Inject 1025mg IM every month and a half.   denosumab 60 MG/ML Sosy injection Commonly known as: PROLIA Inject 60 mg into the skin every 6 (six) months. Started taking 12/10/2011   glucose blood test strip Commonly known as: Accu-Chek Compact Plus CHECK BLOOD SUGARS NO MORE THAN TWICE DAILY DX E11.9   glucose blood test strip Commonly known as: Accu-Chek Guide Use to test blood sugar twice daily- Dx code E11.9   Accu-Chek Guide test strip Generic drug: glucose blood USE TO TEST BLOOD SUGAR TWICE DAILY- DX CODE E11.9   Insulin Pen Needle 31G X 6 MM Misc Use to inject insulin up to 4 times daily.   Lantus SoloStar 100 UNIT/ML Solostar Pen Generic drug: insulin glargine INJECT 35 UNITS SUBCUTANEOUSLY ONCE DAILY .   levothyroxine 100 MCG tablet Commonly known as: SYNTHROID Take 100 mcg by mouth daily before breakfast. What changed: Another medication with the same name was removed. Continue taking this medication, and follow the directions you see here. Changed by: AElayne Snare MD   lisinopril 10 MG tablet Commonly known as: ZESTRIL '10mg'$  in AM and '20mg'$  at night   meloxicam 7.5 MG tablet Commonly known as: MOBIC Take 1 tablet (7.5 mg total) by mouth daily as needed for pain.   metFORMIN 1000 MG tablet Commonly known as: GLUCOPHAGE Take 1,000 mg by mouth 2 (two) times daily with a meal. Take 1 tablet by mouth twice daily.   MILK THISTLE PLUS PO Take 225 mg by mouth in the morning and at bedtime. What changed: Another medication with the same name was removed. Continue taking this medication, and follow the directions you see here. Changed by: AElayne Snare MD   NovoLOG FlexPen 100 UNIT/ML FlexPen Generic drug: insulin aspart 5-7 units before  dinner and larger meals   Ondansetron 4 MG Film Take by mouth every 8 (eight) hours as needed.   ondansetron 4 MG tablet Commonly known as: ZOFRAN Take 1 tablet (4 mg total) by mouth every 8 (eight) hours as needed for nausea or vomiting.   rosuvastatin 5 MG tablet Commonly known as: CRESTOR Take 5 mg by mouth daily.   traMADol 50 MG  tablet Commonly known as: ULTRAM Take 2 tablets by mouth every 6 (six) hours as needed for moderate pain.   triamterene-hydrochlorothiazide 37.5-25 MG tablet Commonly known as: MAXZIDE-25 Take 1 tablet by mouth daily.   zolpidem 10 MG tablet Commonly known as: AMBIEN Take 0.5-1 tablets (5-10 mg total) by mouth at bedtime as needed for sleep.       Allergies:  Allergies  Allergen Reactions  . Gabapentin Swelling    Edema, legs   . Iodine Hives  . Iohexol   . Nsaids Other (See Comments)    REACTION: hallucinations Tolerates Meloxicam   . Spironolactone     Elevates potassium  . Tolmetin Other (See Comments)    REACTION: hallucinations  . Aciphex [Rabeprazole] Rash  . Ioxaglate Rash and Hives  . Ivp Dye [Iodinated Diagnostic Agents] Hives and Rash  . Nexium [Esomeprazole Magnesium] Rash  . Pepcid [Famotidine] Rash  . Protonix [Pantoprazole Sodium] Rash    Past Medical History:  Diagnosis Date  . Adenomatous polyps 11/1998  . Anxiety   . Aortic valve insufficiency    mild  . Breast cancer ([place]) 04/2007   s/p XRT (last 4/09)  . Cervical myelopathy ([place])   . Cervical stenosis of spine    sees neuro surgery and dr.ramons s/p local injection 4/09  . Chronic constipation   . Chronic pancreatitis ([place]) 09/29/2014  . Colon polyps 02/2010   Tubular  Adenomas                                     . Diabetes mellitus   . Difficulty in urination    pt uses cath at times  . Diverticulosis of colon   . Elevated liver function tests    fatty liver per CT 2009  . Fatty liver   . Full body hives    if patient does not take Fexofenadine    . Gastroparesis 10/2015   gastric emptying abnormal for 4 hours  . GERD (gastroesophageal reflux disease)   . Hemorrhoids   . Hyperlipemia   . Hypertension   . Hypothyroidism   . Insomnia   . Lumbar spondylosis    Lumbar epidural steroid injection L5-S1 to the left, Dr. Nelva Bush  . Mitral valve regurgitation   . Osteoarthritis   . Personal history of radiation therapy 2009  . 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 Restless leg syndrome   . Urticaria 06/2009    Past Surgical History:  Procedure Laterality Date  . ANTERIOR CERVICAL DECOMP/DISCECTOMY FUSION N/A 01/08/2013   Procedure: Cervical three-four, four-five, five-six anterior cervical decompression with fusion plating and bonegraft;  Surgeon: Eustace Moore, MD;  Location: [facility];  Service: Neurosurgery;  Laterality: N/A;  Cervical three-four, four-five, five-six anterior cervical decompression with fusion plating and bonegraft  . APPENDECTOMY    . BREAST LUMPECTOMY Left 2009   left breast  . CARDIAC CATHETERIZATION  2001  . CATARACT EXTRACTION, BILATERAL  2/11  . EYE SURGERY    . GANGLION CYST EXCISION Right    wrist  . sinus cyst removed    . TRANSCATHETER AORTIC VALVE REPLACEMENT, TRANSAORTIC  01/09/2019  . UTERINE FIBROID SURGERY      Family History  Problem Relation Age of Onset  . Hypertension Mother   . Lung cancer Mother        mets to brain  . Brain cancer  Mother   . Tuberculosis Father        died at 55  . Coronary artery disease Neg Hx   . Diabetes Neg Hx   . Stroke Neg Hx   . Colon cancer Neg Hx   . Breast cancer Neg Hx     Social History:  reports that she has never smoked. She has never used smokeless tobacco. She reports current alcohol use. She reports that she does not use drugs.    Review of Systems    Lipid history: Usually has LDL below 100 Not on a statin drug   Lab Results  Component Value Date   CHOL 117 09/15/2019   HDL 40.40 09/15/2019   LDLCALC 56 09/15/2019    TRIG 100.0 09/15/2019   CHOLHDL 3 09/15/2019            Hypertension: She is on lisinopril 10 mg and 25 mg dose of Maxzide prescribed by her PCP with good control  Gastrointestinal: History of  Cirrhosis followed by gastroenterologist regularly   Has ?  gastroparesis with history of nausea with vomiting and is on a special diet, recently told to try not taking metoclopramide   Endocrine:  Long history of thyroid disease: Was told in her teen years that she was hypothyroid and this may have been a sequela to possible radiation treatment in childhood in [place]   This is being managed by her PCP   Lab Results  Component Value Date   TSH 4.95 (H) 09/15/2019   TSH 4.22 01/13/2019   TSH 4.50 09/11/2018   FREET4 1.07 09/15/2019   FREET4 1.13 12/23/2017   FREET4 0.96 09/07/2016   Neuropathy: She has mild numbness in her feet Foot exam in 12/2017 showed:  Decreased monofilament sensation on the toes and distal plantar surfaces of the right side  Physical Examination:  BP 122/64 (BP Location: Left Arm, Patient Position: Sitting, Cuff Size: Normal)   Pulse 62   Ht '5\' 4"'$  (1.626 m)   Wt 156 lb 9.6 oz (71 kg)   SpO2 98%   BMI 26.88 kg/m    Diabetic Foot Exam - Simple   Simple Foot Form Diabetic Foot exam was performed with the following findings: Yes   Visual Inspection No deformities, no ulcerations, no other skin breakdown bilaterally: Yes Sensation Testing See comments: Yes Pulse Check Posterior Tibialis and Dorsalis pulse intact bilaterally: Yes Comments  Variably decreased monofilament sensation on the toes and distal plantar surfaces, absent in some areas        ASSESSMENT/PLAN:   Diabetes type 2, uncontrolled    See history of present illness for detailed discussion of current diabetes management, blood sugar patterns and problems identified  Her A1c had gone up to 7.3 which is higher than usual for her  Blood sugars are generally be improving with adding  NovoLog at dinnertime, usually taking 6 units Lantus has been unchanged She is fairly consistent with taking her insulin as prescribed and except when she is eating out she may take her NovoLog 5 to 10-minute before eating For now we will continue the same regimen Reminded her to take her NovoLog before starting to eat and take it with her when she is eating out If she is missing the NovoLog dose she should not take it more than 15 minutes after eating Also if blood sugars are unusually high postprandially she will only take up to 3-4 units for correction and not 6 units May take 2 to 4 units  extra for eating dessert at dinnertime  Since she is liking sweets she can try only small amounts of sugar-free candy that she is asking about to see if it affects her sugar Encouraged her to be as active as possible No change in Lantus unless morning sugars start getting below 90 No change in Metformin   HYPOTHYROIDISM: She will follow-up with her PCP regarding her high TSH level,  NEUROPATHY: She needs to check her feet regularly    There are no Patient Instructions on file for this visit.  Elayne Snare 11/27/2019, 8:17 AM   Note: This office note was prepared with Dragon voice recognition system technology. Any transcriptional errors that result from this process are unintentional. `

## 2019-12-02 ENCOUNTER — Encounter: Payer: Self-pay | Admitting: Internal Medicine

## 2019-12-07 ENCOUNTER — Other Ambulatory Visit: Payer: Self-pay

## 2019-12-07 ENCOUNTER — Ambulatory Visit (INDEPENDENT_AMBULATORY_CARE_PROVIDER_SITE_OTHER): Payer: Medicare Other | Admitting: Family Medicine

## 2019-12-07 ENCOUNTER — Other Ambulatory Visit: Payer: Self-pay | Admitting: Family Medicine

## 2019-12-07 ENCOUNTER — Encounter: Payer: Self-pay | Admitting: Family Medicine

## 2019-12-07 VITALS — BP 110/70 | HR 73 | Temp 98.7°F | Resp 18 | Ht 64.0 in | Wt 154.8 lb

## 2019-12-07 DIAGNOSIS — R300 Dysuria: Secondary | ICD-10-CM | POA: Diagnosis not present

## 2019-12-07 LAB — POC URINALSYSI DIPSTICK (AUTOMATED)
Bilirubin, UA: NEGATIVE
Blood, UA: NEGATIVE
Glucose, UA: NEGATIVE
Ketones, UA: NEGATIVE
Leukocytes, UA: NEGATIVE
Nitrite, UA: NEGATIVE
Protein, UA: NEGATIVE
Spec Grav, UA: 1.02 (ref 1.010–1.025)
Urobilinogen, UA: 0.2 E.U./dL
pH, UA: 6 (ref 5.0–8.0)

## 2019-12-07 NOTE — Progress Notes (Signed)
Patient ID: Sharon Armstrong, female    DOB: May 28, 1940  Age: 79 y.o. MRN: [id number]    Subjective:  Subjective  HPI Sharon Armstrong presents for dysuria , hematuria and frequency x 3 days    No fever , back pain     No vaginal d/c or odor   Review of Systems  Constitutional: Negative for appetite change, diaphoresis, fatigue and unexpected weight change.  Eyes: Negative for pain, redness and visual disturbance.  Respiratory: Negative for cough, chest tightness, shortness of breath and wheezing.   Cardiovascular: Negative for chest pain, palpitations and leg swelling.  Endocrine: Negative for cold intolerance, heat intolerance, polydipsia, polyphagia and polyuria.  Genitourinary: Positive for dysuria, frequency and hematuria. Negative for decreased urine volume, difficulty urinating, flank pain, urgency, vaginal bleeding, vaginal discharge and vaginal pain.  Neurological: Negative for dizziness, light-headedness, numbness and headaches.    History Past Medical History:  Diagnosis Date  . Adenomatous polyps 11/1998  . Anxiety   . Aortic valve insufficiency    mild  . Breast cancer ([place]) 04/2007   s/p XRT (last 4/09)  . Cervical myelopathy ([place])   . Cervical stenosis of spine    sees neuro surgery and dr.ramons s/p local injection 4/09  . Chronic constipation   . Chronic pancreatitis ([place]) 09/29/2014  . Colon polyps 02/2010   Tubular  Adenomas                                     . Diabetes mellitus   . Difficulty in urination    pt uses cath at times  . Diverticulosis of colon   . Elevated liver function tests    fatty liver per CT 2009  . Fatty liver   . Full body hives    if patient does not take Fexofenadine  . Gastroparesis 10/2015   gastric emptying abnormal for 4 hours  . GERD (gastroesophageal reflux disease)   . Hemorrhoids   . Hyperlipemia   . Hypertension   . Hypothyroidism   . Insomnia   . Lumbar spondylosis    Lumbar epidural steroid injection L5-S1 to  the left, Dr. Nelva Bush  . Mitral valve regurgitation   . Osteoarthritis   . Personal history of radiation therapy 2009  . 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 Restless leg syndrome   . Urticaria 06/2009    She has a past surgical history that includes Appendectomy; Cataract extraction, bilateral (2/11); sinus cyst removed; Eye surgery; Cardiac catheterization (2001); Anterior cervical decomp/discectomy fusion (N/A, 01/08/2013); Ganglion cyst excision (Right); Uterine fibroid surgery; Transcatheter aortic valve replacement, transaortic (01/09/2019); and Breast lumpectomy (Left, 2009).   Her family history includes Brain cancer in her mother; Hypertension in her mother; Lung cancer in her mother; Tuberculosis in her father.She reports that she has never smoked. She has never used smokeless tobacco. She reports current alcohol use. She reports that she does not use drugs.  Current Outpatient Medications on File Prior to Visit  Medication Sig Dispense Refill  . ACCU-CHEK FASTCLIX LANCETS MISC Use to test blood twice daily- Dx code E11.9 102 each 2  . ACCU-CHEK GUIDE test strip USE TO TEST BLOOD SUGAR TWICE DAILY- DX CODE E11.9 200 strip 2  . amLODipine (NORVASC) 5 MG tablet Take 5 mg by mouth daily.    Marland Kitchen aspirin EC 81 MG tablet Take 81 mg by mouth daily.    Marland Kitchen  carbidopa-levodopa (PARCOPA) 25-250 MG per disintegrating tablet Take 1-2 tablets by mouth at bedtime. 1 tab in Am, 1 PM, and 2 qhs    . Cyanocobalamin 1000 MCG/ML KIT Inject 1,000 mcg as directed See admin instructions. Inject 1031mg IM every month and a half.    . denosumab (PROLIA) 60 MG/ML SOSY injection Inject 60 mg into the skin every 6 (six) months. Started taking 12/10/2011    . glucose blood (ACCU-CHEK COMPACT PLUS) test strip CHECK BLOOD SUGARS NO MORE THAN TWICE DAILY DX E11.9 200 each 4  . glucose blood (ACCU-CHEK GUIDE) test strip Use to test blood sugar twice daily- Dx code E11.9 100 each 4  . insulin aspart (NOVOLOG  FLEXPEN) 100 UNIT/ML FlexPen 5-7 units before dinner and larger meals 15 mL 0  . insulin glargine (LANTUS SOLOSTAR) 100 UNIT/ML Solostar Pen INJECT 35 UNITS SUBCUTANEOUSLY ONCE DAILY . 45 mL 1  . Insulin Pen Needle 31G X 6 MM MISC Use to inject insulin up to 4 times daily. 120 each 3  . levothyroxine (SYNTHROID) 100 MCG tablet Take 100 mcg by mouth daily before breakfast.    . Lidocaine (ASPERCREME LIDOCAINE) 4 % PTCH Apply topically.    .Marland Kitchenlisinopril (ZESTRIL) 10 MG tablet '20mg'$  in AM and '40mg'$  at night    . meloxicam (MOBIC) 7.5 MG tablet Take 1 tablet (7.5 mg total) by mouth daily as needed for pain. 90 tablet 0  . metFORMIN (GLUCOPHAGE) 1000 MG tablet Take 1,000 mg by mouth 2 (two) times daily with a meal. Take 1 tablet by mouth twice daily.    .Marland KitchenMILK THISTLE PLUS PO Take 225 mg by mouth in the morning and at bedtime.    . ondansetron (ZOFRAN) 4 MG tablet Take 1 tablet (4 mg total) by mouth every 8 (eight) hours as needed for nausea or vomiting. 30 tablet 1  . Ondansetron 4 MG FILM Take by mouth every 8 (eight) hours as needed.    . rosuvastatin (CRESTOR) 5 MG tablet Take 5 mg by mouth daily.    . traMADol (ULTRAM) 50 MG tablet Take 2 tablets by mouth every 6 (six) hours as needed for moderate pain.     .Marland Kitchentriamterene-hydrochlorothiazide (MAXZIDE-25) 37.5-25 MG tablet Take 1 tablet by mouth daily.    .Marland Kitchenzolpidem (AMBIEN) 10 MG tablet Take 0.5-1 tablets (5-10 mg total) by mouth at bedtime as needed for sleep. 30 tablet 3   No current facility-administered medications on file prior to visit.     Objective:  Objective  Physical Exam Vitals and nursing note reviewed.  Constitutional:      Appearance: She is well-developed.  HENT:     Head: Normocephalic and atraumatic.  Eyes:     Conjunctiva/sclera: Conjunctivae normal.  Neck:     Thyroid: No thyromegaly.     Vascular: No carotid bruit or JVD.  Cardiovascular:     Rate and Rhythm: Normal rate and regular rhythm.     Heart sounds: Normal  heart sounds. No murmur heard.   Pulmonary:     Effort: Pulmonary effort is normal. No respiratory distress.     Breath sounds: Normal breath sounds. No wheezing or rales.  Chest:     Chest wall: No tenderness.  Abdominal:     General: There is no distension.     Tenderness: There is no abdominal tenderness. There is no right CVA tenderness, left CVA tenderness, guarding or rebound.  Musculoskeletal:     Cervical back: Normal range of motion  and neck supple.  Neurological:     Mental Status: She is alert and oriented to person, place, and time.    BP 110/70 (BP Location: Right Arm, Patient Position: Sitting, Cuff Size: Normal)   Pulse 73   Temp 98.7 F (37.1 C) (Temporal)   Resp 18   Ht '5\' 4"'$  (1.626 m)   Wt 154 lb 12.8 oz (70.2 kg)   SpO2 99%   BMI 26.57 kg/m  Wt Readings from Last 3 Encounters:  12/07/19 154 lb 12.8 oz (70.2 kg)  11/26/19 156 lb 9.6 oz (71 kg)  09/17/19 157 lb 6.4 oz (71.4 kg)     Lab Results  Component Value Date   WBC 4.6 09/16/2019   HGB 10.7 (L) 09/16/2019   HCT 32.7 (L) 09/16/2019   PLT 132.0 (L) 09/16/2019   GLUCOSE 146 (H) 11/09/2019   CHOL 117 09/15/2019   TRIG 100.0 09/15/2019   HDL 40.40 09/15/2019   LDLCALC 56 09/15/2019   ALT 8 09/15/2019   AST 33 09/15/2019   NA 140 11/09/2019   K 4.9 11/09/2019   CL 106 11/09/2019   CREATININE 0.93 11/09/2019   BUN 35 (H) 11/09/2019   CO2 26 11/09/2019   TSH 4.95 (H) 09/15/2019   INR 1.15 01/08/2013   HGBA1C 7.3 (H) 09/15/2019   MICROALBUR 2.0 (H) 09/15/2019    MM 3D SCREEN BREAST BILATERAL  Result Date: 11/24/2019 CLINICAL DATA:  Screening. Prior malignant lumpectomy of the LEFT in 2 8. EXAM: DIGITAL SCREENING BILATERAL MAMMOGRAM WITH TOMO AND CAD COMPARISON:  Previous exam(s). ACR Breast Density Category b: There are scattered areas of fibroglandular density. FINDINGS: There are no findings suspicious for malignancy. Stable post lumpectomy changes involving the LEFT breast. Images were  processed with CAD. IMPRESSION: No mammographic evidence of malignancy. A result letter of this screening mammogram will be mailed directly to the patient. RECOMMENDATION: Screening mammogram in one year. (Code:SM-B-01Y) BI-RADS CATEGORY  2: Benign. Electronically Signed   By: Evangeline Dakin M.D.   On: 11/24/2019 16:14     Assessment & Plan:  Plan  I am having Sharon Armstrong maintain her carbidopa-levodopa, traMADol, Lidocaine, Cyanocobalamin, glucose blood, Accu-Chek FastClix Lancets, glucose blood, meloxicam, triamterene-hydrochlorothiazide, aspirin EC, zolpidem, Insulin Pen Needle, lisinopril, ondansetron, metFORMIN, NovoLOG FlexPen, Lantus SoloStar, Accu-Chek Guide, amLODipine, rosuvastatin, Ondansetron, levothyroxine, MILK THISTLE PLUS PO, and denosumab.  No orders of the defined types were placed in this encounter.   Problem List Items Addressed This Visit    None    Visit Diagnoses    Dysuria    -  Primary   Relevant Orders   POCT Urinalysis Dipstick (Automated) (Completed)   Urine Culture    refer to urology for dysuria , frequency and blood--- ? IC Culture is pending  Follow-up: Return if symptoms worsen or fail to improve.  Ann Held, DO

## 2019-12-07 NOTE — Patient Instructions (Signed)
Interstitial Cystitis  Interstitial cystitis is inflammation of the bladder. This may cause pain in the bladder area as well as a frequent and urgent need to urinate. The bladder is a hollow organ in the lower part of the abdomen. It stores urine after the urine is made in the kidneys. The severity of interstitial cystitis can vary from person to person. You may have flare-ups, and then your symptoms may go away for a while. For many people, it becomes a long-term (chronic) problem. What are the causes? The cause of this condition is not known. What increases the risk? The following factors may make you more likely to develop this condition:  You are female.  You have fibromyalgia.  You have irritable bowel syndrome (IBS).  You have endometriosis. This condition may be aggravated by:  Stress.  Smoking.  Spicy foods. What are the signs or symptoms? Symptoms of interstitial cystitis vary, and they can change over time. Symptoms may include:  Discomfort or pain in the bladder area, which is in the lower abdomen. Pain can range from mild to severe. The pain may change in intensity as the bladder fills with urine or as it empties.  Pain in the pelvic area, between the hip bones.  An urgent need to urinate.  Frequent urination.  Pain during urination.  Pain during sex.  Blood in the urine. For women, symptoms often get worse during menstruation. How is this diagnosed? This condition is diagnosed based on your symptoms, your medical history, and a physical exam. You may have tests to rule out other conditions, such as:  Urine tests.  Cystoscopy. For this test, a tool similar to a very thin telescope is used to look into your bladder.  Biopsy. This involves taking a sample of tissue from the bladder to be examined under a microscope. How is this treated? There is no cure for this condition, but treatment can help you control your symptoms. Work closely with your health care  provider to find the most effective treatments for you. Treatment options may include:  Medicines to relieve pain and reduce how often you feel the need to urinate.  Learning ways to control when you urinate (bladder training).  Lifestyle changes, such as changing your diet or taking steps to control stress.  Using a device that provides electrical stimulation to your nerves, which can relieve pain (neuromodulation therapy). The device is placed on your back, where it blocks the nerves that cause you to feel pain in your bladder area.  A procedure that stretches your bladder by filling it with air or fluid.  Surgery. This is rare. It is only done for extreme cases, if other treatments do not help. Follow these instructions at home: Bladder training   Use bladder training techniques as directed. Techniques may include: ? Urinating at scheduled times. ? Training yourself to delay urination. ? Doing exercises (Kegel exercises) to strengthen the muscles that control urine flow.  Keep a bladder diary. ? Write down the times that you urinate and any symptoms that you have. This can help you find out which foods, liquids, or activities make your symptoms worse. ? Use your bladder diary to schedule bathroom trips. If you are away from home, plan to be near a bathroom at each of your scheduled times.  Make sure that you urinate just before you leave the house and just before you go to bed. Eating and drinking  Make dietary changes as recommended by your health care provider. You   may need to avoid: ? Spicy foods. ? Foods that contain a lot of potassium.  Limit your intake of beverages that make you need to urinate. These include: ? Caffeinated beverages like soda, coffee, and tea. ? Alcohol. General instructions  Take over-the-counter and prescription medicines only as told by your health care provider.  Do not drink alcohol.  You can try a warm or cool compress over your bladder for  comfort.  Avoid wearing tight clothing.  Do not use any products that contain nicotine or tobacco, such as cigarettes and e-cigarettes. If you need help quitting, ask your health care provider.  Keep all follow-up visits as told by your health care provider. This is important. Contact a health care provider if you have:  Symptoms that do not get better with treatment.  Pain or discomfort that gets worse.  More frequent urges to urinate.  A fever. Get help right away if:  You have no control over when you urinate. Summary  Interstitial cystitis is inflammation of the bladder.  This condition may cause pain in the bladder area as well as a frequent and urgent need to urinate.  You may have flare-ups of the condition, and then it may go away for a while. For many people, it becomes a long-term (chronic) problem.  There is no cure for interstitial cystitis, but treatment methods are available to control your symptoms. This information is not intended to replace advice given to you by your health care provider. Make sure you discuss any questions you have with your health care provider. Document Revised: 04/19/2017 Document Reviewed: 04/01/2017 Elsevier Patient Education  2020 [employer]  

## 2019-12-07 NOTE — Progress Notes (Unsigned)
Subjective:    Sharon Armstrong is a 79 y.o. female who complains of burning with urination and frequency for 3 days.  Patient also complains of none. Patient denies back pain, congestion, cough, fever, headache, rhinitis, sorethroat, stomach ache and vaginal discharge.  Patient does have a history of recurrent UTI.  Patient does not have a history of pyelonephritis. The following portions of the patient's history were reviewed and updated as appropriate:  She  has a past medical history of Adenomatous polyps (11/1998), Anxiety, Aortic valve insufficiency, Breast cancer ([place]) (04/2007), Cervical myelopathy ([place]), Cervical stenosis of spine, Chronic constipation, Chronic pancreatitis ([place]) (09/29/2014), Colon polyps (02/2010), Diabetes mellitus, Difficulty in urination, Diverticulosis of colon, Elevated liver function tests, Fatty liver, Full body hives, Gastroparesis (10/2015), GERD (gastroesophageal reflux disease), Hemorrhoids, Hyperlipemia, Hypertension, Hypothyroidism, Insomnia, Lumbar spondylosis, Mitral valve regurgitation, Osteoarthritis, Personal history of radiation therapy (2009), Positive PPD, Restless leg syndrome, and Urticaria (06/2009). She does not have any pertinent problems on file. 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 Her family history includes Brain cancer in her mother; Hypertension in her mother; Lung cancer in her mother; Tuberculosis in her father. She  reports that she has never smoked. She has never used smokeless tobacco. She reports current alcohol use. She reports that she does not use drugs. She has a current medication list which  includes the following prescription(s): accu-chek fastclix lancets, accu-chek guide, amlodipine, aspirin ec, carbidopa-levodopa, cyanocobalamin, denosumab, glucose blood, glucose blood, novolog flexpen, lantus solostar, insulin pen needle, levothyroxine, lidocaine, lisinopril, meloxicam, metformin, milk thistle, ondansetron, ondansetron, rosuvastatin, tramadol, triamterene-hydrochlorothiazide, and zolpidem. Current Outpatient Medications on File Prior to Visit  Medication Sig Dispense Refill   ACCU-CHEK FASTCLIX LANCETS MISC Use to test blood twice daily- Dx code E11.9 102 each 2   ACCU-CHEK GUIDE test strip USE TO TEST BLOOD SUGAR TWICE DAILY- DX CODE E11.9 200 strip 2   amLODipine (NORVASC) 5 MG tablet Take 5 mg by mouth daily.     aspirin EC 81 MG tablet Take 81 mg by mouth daily.     carbidopa-levodopa (PARCOPA) 25-250 MG per disintegrating tablet Take 1-2 tablets by mouth at bedtime. 1 tab in Am, 1 PM, and 2 qhs     Cyanocobalamin 1000 MCG/ML KIT Inject 1,000 mcg as directed See admin instructions. Inject 1016mg IM every month and a half.     denosumab (PROLIA) 60 MG/ML SOSY injection Inject 60 mg into the skin every 6 (six) months. Started taking 12/10/2011     glucose blood (ACCU-CHEK COMPACT PLUS) test strip CHECK BLOOD SUGARS NO MORE THAN TWICE DAILY DX E11.9 200 each 4   glucose blood (ACCU-CHEK GUIDE) test strip Use to test blood sugar twice daily- Dx code E11.9 100 each 4   insulin aspart (NOVOLOG FLEXPEN) 100 UNIT/ML FlexPen 5-7 units before dinner and larger meals 15 mL 0   insulin glargine (LANTUS SOLOSTAR) 100 UNIT/ML Solostar Pen INJECT 35 UNITS SUBCUTANEOUSLY ONCE DAILY . 45 mL 1   Insulin Pen Needle 31G X 6 MM MISC Use to inject insulin up to 4 times daily. 120 each 3   levothyroxine (SYNTHROID) 100 MCG tablet Take 100 mcg by mouth daily before breakfast.     Lidocaine (ASPERCREME LIDOCAINE) 4 % PTCH Apply topically.  lisinopril (ZESTRIL) 10 MG tablet '20mg'$  in AM  and '40mg'$  at night     meloxicam (MOBIC) 7.5 MG tablet Take 1 tablet (7.5 mg total) by mouth daily as needed for pain. 90 tablet 0   metFORMIN (GLUCOPHAGE) 1000 MG tablet Take 1,000 mg by mouth 2 (two) times daily with a meal. Take 1 tablet by mouth twice daily.     MILK THISTLE PLUS PO Take 225 mg by mouth in the morning and at bedtime.     ondansetron (ZOFRAN) 4 MG tablet Take 1 tablet (4 mg total) by mouth every 8 (eight) hours as needed for nausea or vomiting. 30 tablet 1   Ondansetron 4 MG FILM Take by mouth every 8 (eight) hours as needed.     rosuvastatin (CRESTOR) 5 MG tablet Take 5 mg by mouth daily.     traMADol (ULTRAM) 50 MG tablet Take 2 tablets by mouth every 6 (six) hours as needed for moderate pain.      triamterene-hydrochlorothiazide (MAXZIDE-25) 37.5-25 MG tablet Take 1 tablet by mouth daily.     zolpidem (AMBIEN) 10 MG tablet Take 0.5-1 tablets (5-10 mg total) by mouth at bedtime as needed for sleep. 30 tablet 3   No current facility-administered medications on file prior to visit.   She is allergic to gabapentin, iodine, iohexol, nsaids, spironolactone, tolmetin, aciphex [rabeprazole], ioxaglate, ivp dye [iodinated diagnostic agents], nexium [esomeprazole magnesium], pepcid [famotidine], and protonix [pantoprazole sodium].. Review of Systems {ros - complete:30496}    Objective:    There were no vitals taken for this visit. General: alert, cooperative and no distress  Abdomen: soft, non-tender, without masses or organomegaly no pain  Back: back muscles are normal, spine nontender, CVA tenderness absent  GU: defer exam   Laboratory:  Urine dipstick shows negative for all components.   Micro exam: not done.    Assessment:    Acute cystitis    Plan: Plan:    1. Medications: not indicated at this time 2. Maintain adequate hydration 3. Follow up if symptoms not improving, and prn.

## 2019-12-08 LAB — URINE CULTURE
MICRO NUMBER:: 10720875
SPECIMEN QUALITY:: ADEQUATE

## 2019-12-16 ENCOUNTER — Other Ambulatory Visit: Payer: Self-pay

## 2019-12-16 MED ORDER — LEVOTHYROXINE SODIUM 100 MCG PO TABS: 100.0000 ug | ORAL_TABLET | Freq: Every day | ORAL | 1 refills | Status: DC

## 2019-12-21 ENCOUNTER — Other Ambulatory Visit: Payer: Self-pay | Admitting: Internal Medicine

## 2019-12-21 ENCOUNTER — Encounter: Payer: Self-pay | Admitting: Internal Medicine

## 2019-12-22 ENCOUNTER — Other Ambulatory Visit: Payer: Self-pay | Admitting: Internal Medicine

## 2019-12-25 ENCOUNTER — Encounter: Payer: Self-pay | Admitting: Internal Medicine

## 2020-01-06 ENCOUNTER — Other Ambulatory Visit: Payer: Self-pay | Admitting: Urology

## 2020-01-06 DIAGNOSIS — R310 Gross hematuria: Secondary | ICD-10-CM

## 2020-01-18 ENCOUNTER — Other Ambulatory Visit: Payer: Self-pay | Admitting: Urology

## 2020-01-18 ENCOUNTER — Ambulatory Visit (HOSPITAL_COMMUNITY): Admission: RE | Admit: 2020-01-18 | Payer: Medicare Other | Source: Ambulatory Visit

## 2020-01-18 DIAGNOSIS — R310 Gross hematuria: Secondary | ICD-10-CM

## 2020-01-19 ENCOUNTER — Other Ambulatory Visit: Payer: Self-pay

## 2020-01-19 ENCOUNTER — Ambulatory Visit (HOSPITAL_COMMUNITY)
Admission: RE | Admit: 2020-01-19 | Discharge: 2020-01-19 | Disposition: A | Discharge status: Home / Self Care | Payer: Medicare Other | Source: Ambulatory Visit | Attending: Urology | Admitting: Urology

## 2020-01-19 DIAGNOSIS — R310 Gross hematuria: Secondary | ICD-10-CM | POA: Diagnosis present

## 2020-01-19 MED ORDER — GADOBUTROL 1 MMOL/ML IV SOLN
7.0000 mL | Freq: Once | INTRAVENOUS | Status: AC | PRN
  Administered 2020-01-19: 7 mL via INTRAVENOUS

## 2020-01-26 ENCOUNTER — Other Ambulatory Visit: Payer: Medicare Other

## 2020-01-28 ENCOUNTER — Ambulatory Visit: Payer: Medicare Other | Admitting: Endocrinology

## 2020-02-04 ENCOUNTER — Telehealth: Payer: Self-pay | Admitting: Internal Medicine

## 2020-02-04 NOTE — Progress Notes (Signed)
°  Chronic Care Management   Note  02/04/2020 Name: Sharon Armstrong MRN: [id number] DOB: 05/25/1940  Sharon Armstrong is a 79 y.o. year old female who is a primary care patient of Paz, Alda Berthold, MD. I reached out to Sharon Armstrong by phone today in response to a referral sent by Ms. Ronda R Lourenco's PCP, Colon Branch, MD.   [name] was given information about Chronic Care Management services today including:  1. CCM service includes personalized support from designated clinical staff supervised by her physician, including individualized plan of care and coordination with other care providers 2. 24/7 contact phone numbers for assistance for urgent and routine care needs. 3. Service will only be billed when office clinical staff spend 20 minutes or more in a month to coordinate care. 4. Only one practitioner may furnish and bill the service in a calendar month. 5. The patient may stop CCM services at any time (effective at the end of the month) by phone call to the office staff.   Patient did not agree to enrollment in care management services and does not wish to consider at this time.  Follow up plan:   Carley Perdue [employer] Scheduler

## 2020-02-05 ENCOUNTER — Other Ambulatory Visit: Payer: Self-pay | Admitting: Internal Medicine

## 2020-02-13 ENCOUNTER — Other Ambulatory Visit: Payer: Self-pay | Admitting: Endocrinology

## 2020-03-03 ENCOUNTER — Other Ambulatory Visit: Payer: Self-pay

## 2020-03-03 ENCOUNTER — Other Ambulatory Visit (INDEPENDENT_AMBULATORY_CARE_PROVIDER_SITE_OTHER): Payer: Medicare Other

## 2020-03-03 DIAGNOSIS — E063 Autoimmune thyroiditis: Secondary | ICD-10-CM | POA: Diagnosis not present

## 2020-03-03 DIAGNOSIS — Z794 Long term (current) use of insulin: Secondary | ICD-10-CM

## 2020-03-03 DIAGNOSIS — E1165 Type 2 diabetes mellitus with hyperglycemia: Secondary | ICD-10-CM | POA: Diagnosis not present

## 2020-03-03 LAB — HEMOGLOBIN A1C: Hgb A1c MFr Bld: 6.9 % — ABNORMAL HIGH (ref 4.6–6.5)

## 2020-03-03 LAB — BASIC METABOLIC PANEL
BUN: 23 mg/dL (ref 6–23)
CO2: 28 mEq/L (ref 19–32)
Calcium: 10.3 mg/dL (ref 8.4–10.5)
Chloride: 102 mEq/L (ref 96–112)
Creatinine, Ser: 0.84 mg/dL (ref 0.40–1.20)
GFR: 66.17 mL/min (ref >60.00)
Glucose, Bld: 103 mg/dL — ABNORMAL HIGH (ref 70–99)
Potassium: 4.1 mEq/L (ref 3.5–5.1)
Sodium: 140 mEq/L (ref 135–145)

## 2020-03-03 LAB — TSH: TSH: 2.5 u[IU]/mL (ref 0.35–4.50)

## 2020-03-04 LAB — FRUCTOSAMINE: Fructosamine: 274 umol/L (ref 0–285)

## 2020-03-07 ENCOUNTER — Ambulatory Visit: Payer: Medicare Other | Admitting: Endocrinology

## 2020-03-09 ENCOUNTER — Other Ambulatory Visit: Payer: Self-pay

## 2020-03-09 ENCOUNTER — Ambulatory Visit (INDEPENDENT_AMBULATORY_CARE_PROVIDER_SITE_OTHER): Payer: Medicare Other | Admitting: Endocrinology

## 2020-03-09 ENCOUNTER — Encounter: Payer: Self-pay | Admitting: Endocrinology

## 2020-03-09 VITALS — BP 138/68 | HR 81 | Ht 64.0 in | Wt 156.2 lb

## 2020-03-09 DIAGNOSIS — E782 Mixed hyperlipidemia: Secondary | ICD-10-CM

## 2020-03-09 DIAGNOSIS — E063 Autoimmune thyroiditis: Secondary | ICD-10-CM | POA: Diagnosis not present

## 2020-03-09 DIAGNOSIS — Z794 Long term (current) use of insulin: Secondary | ICD-10-CM

## 2020-03-09 DIAGNOSIS — E1165 Type 2 diabetes mellitus with hyperglycemia: Secondary | ICD-10-CM

## 2020-03-09 NOTE — Progress Notes (Signed)
Patient ID: Sharon Armstrong, female   DOB: [birth year], 79 y.o.   MRN: [id number]           Reason for Appointment: Follow-up for Type 2 Diabetes   History of Present Illness:       Background history: She had been treated for several years with metformin and Amaryl after diagnosis Her control had been fairly good until about 2012 and she thinks that because of poor control with oral agents she was given Lantus insulin in addition She has been on Lantus and metformin since then with variable control  Recent history:       Insulin regimen is described as: 35 units of Lantus in am, NovoLog 5-6 units as needed   Oral hypoglycemic drugs the patient is taking are: Metformin ER 1 g twice a day   Her A1c is 6.9 Her previous range 6.1-7.3  Current blood sugar patterns, problems identified:  She has been irregular on NovoLog before her dinner meal even though she was trying to take it more regularly on her last visit  However surprisingly her A1c has not gone up further  She forgets to check her sugars after meals and only started checking readings at night since she had epidural steroid a couple of days ago  Fasting readings appear to be fairly good although again checking very infrequently  She is trying to do a lot of walking although limited by various pain problems  Her weight is about the same as before  She usually has at least 1 serving of carbohydrate at dinnertime and occasionally will have a dessert  Apparently she has sometimes taken her NovoLog when the blood sugars are high at bedtime including last night when it was 271  She lives up to 6 months in [place] during winter and has a physician there also       Glucose monitoring:  done about 1-2 times a day         Glucometer:  Accucheck   Blood Glucose readings by review of meter download for the last 2 weeks:   PRE-MEAL Fasting Lunch Dinner Bedtime Overall  Glucose range:  88-153   139, 166  214, 271     Mean/median:     151   POST-MEAL PC Breakfast PC Lunch PC Dinner  Glucose range:    197  Mean/median:      Previous readings:  PRE-MEAL Fasting Lunch Dinner Bedtime Overall  Glucose range:  95-214      Mean/median:  130    121  141   POST-MEAL PC Breakfast PC Lunch PC Dinner  Glucose range:    88-326  Mean/median:    179     Diet consultation was in 6/16   Weight history:    Wt Readings from Last 3 Encounters:  03/09/20 156 lb 3.2 oz (70.9 kg)  12/07/19 154 lb 12.8 oz (70.2 kg)  11/26/19 156 lb 9.6 oz (71 kg)    Glycemic control:   Lab Results  Component Value Date   HGBA1C 6.9 (H) 03/03/2020   HGBA1C 7.3 (H) 09/15/2019   HGBA1C 7.0 01/01/2019   Lab Results  Component Value Date   MICROALBUR 2.0 (H) 09/15/2019   LDLCALC 56 09/15/2019   CREATININE 0.84 03/03/2020      Allergies as of 03/09/2020      Reactions   Gabapentin Swelling   Edema, legs    Iodine Hives   Iohexol    Nsaids Other (See Comments)  REACTION: hallucinations Tolerates Meloxicam    Spironolactone    Elevates potassium   Tolmetin Other (See Comments)   REACTION: hallucinations   Aciphex [rabeprazole] Rash   Ioxaglate Rash, Hives   Ivp Dye [iodinated Diagnostic Agents] Hives, Rash   Nexium [esomeprazole Magnesium] Rash   Pepcid [famotidine] Rash   Protonix [pantoprazole Sodium] Rash      Medication List       Accurate as of March 09, 2020  1:42 PM. If you have any questions, ask your nurse or doctor.        Accu-Chek FastClix Lancets Misc Use to test blood twice daily- 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   Aspercreme Lidocaine 4 % Ptch Generic drug: Lidocaine Apply topically.   aspirin EC 81 MG tablet Take 81 mg by mouth daily.   carbidopa-levodopa 25-250 MG disintegrating tablet Commonly known as: PARCOPA Take 1-2 tablets by mouth at bedtime. 1 tab in Am, 1 PM, and 2 qhs   Cyanocobalamin 1000 MCG/ML  Kit Inject 1,000 mcg as directed See admin instructions. Inject 1021mcg IM every month and a half.   denosumab 60 MG/ML Sosy injection Commonly known as: PROLIA Inject 60 mg into the skin every 6 (six) months. Started taking 12/10/2011   glucose blood test strip Commonly known as: Accu-Chek Compact Plus CHECK BLOOD SUGARS NO MORE THAN TWICE DAILY DX E11.9   glucose blood test strip Commonly known as: Accu-Chek Guide Use to test blood sugar twice daily- Dx code E11.9   Accu-Chek Guide test strip Generic drug: glucose blood USE TO TEST BLOOD SUGAR TWICE DAILY- DX CODE E11.9   Insulin Pen Needle 31G X 6 MM Misc Use to inject insulin up to 4 times daily.   Lantus SoloStar 100 UNIT/ML Solostar Pen Generic drug: insulin glargine INJECT 35 UNITS SUBCUTANEOUSLY ONCE DAILY .   levothyroxine 100 MCG tablet Commonly known as: SYNTHROID Take 1 tablet (100 mcg total) by mouth daily before breakfast.   lisinopril 10 MG tablet Commonly known as: ZESTRIL $RemoveBef'20mg'BotcRdMnuK$  in AM and $Remo'40mg'kKePf$  at night   meloxicam 7.5 MG tablet Commonly known as: MOBIC Take 1 tablet (7.5 mg total) by mouth daily as needed for pain.   metFORMIN 1000 MG tablet Commonly known as: GLUCOPHAGE Take 1,000 mg by mouth 2 (two) times daily with a meal. Take 1 tablet by mouth twice daily.   MILK THISTLE PLUS PO Take 225 mg by mouth in the morning and at bedtime.   NovoLOG FlexPen 100 UNIT/ML FlexPen Generic drug: insulin aspart INJECT 5-7 UNITS BEFORE DINNER AND LARGER MEALS   Ondansetron 4 MG Film Take by mouth every 8 (eight) hours as needed.   ondansetron 4 MG tablet Commonly known as: ZOFRAN Take 1 tablet (4 mg total) by mouth every 8 (eight) hours as needed for nausea or vomiting.   rosuvastatin 5 MG tablet Commonly known as: CRESTOR Take 5 mg by mouth daily.   traMADol 50 MG tablet Commonly known as: ULTRAM Take 2 tablets by mouth every 6 (six) hours as needed for moderate pain.     triamterene-hydrochlorothiazide 37.5-25 MG tablet Commonly known as: MAXZIDE-25 Take 1 tablet by mouth daily.   zolpidem 10 MG tablet Commonly known as: AMBIEN Take 0.5-1 tablets (5-10 mg total) by mouth at bedtime as needed for sleep.       Allergies:  Allergies  Allergen Reactions  . Gabapentin Swelling    Edema, legs   .  Iodine Hives  . Iohexol   . Nsaids Other (See Comments)    REACTION: hallucinations Tolerates Meloxicam   . Spironolactone     Elevates potassium  . Tolmetin Other (See Comments)    REACTION: hallucinations  . Aciphex [Rabeprazole] Rash  . Ioxaglate Rash and Hives  . Ivp Dye [Iodinated Diagnostic Agents] Hives and Rash  . Nexium [Esomeprazole Magnesium] Rash  . Pepcid [Famotidine] Rash  . Protonix [Pantoprazole Sodium] Rash    Past Medical History:  Diagnosis Date  . Adenomatous polyps 11/1998  . Anxiety   . Aortic valve insufficiency    mild  . Breast cancer ([place]) 04/2007   s/p XRT (last 4/09)  . Cervical myelopathy ([place])   . Cervical stenosis of spine    sees neuro surgery and dr.ramons s/p local injection 4/09  . Chronic constipation   . Chronic pancreatitis ([place]) 09/29/2014  . Colon polyps 02/2010   Tubular  Adenomas                                     . Diabetes mellitus   . Difficulty in urination    pt uses cath at times  . Diverticulosis of colon   . Elevated liver function tests    fatty liver per CT 2009  . Fatty liver   . Full body hives    if patient does not take Fexofenadine  . Gastroparesis 10/2015   gastric emptying abnormal for 4 hours  . GERD (gastroesophageal reflux disease)   . Hemorrhoids   . Hyperlipemia   . Hypertension   . Hypothyroidism   . Insomnia   . Lumbar spondylosis    Lumbar epidural steroid injection L5-S1 to the left, Dr. Nelva Bush  . Mitral valve regurgitation   . Osteoarthritis   . Personal history of radiation therapy 2009  . 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 Restless leg syndrome    . Urticaria 06/2009    Past Surgical History:  Procedure Laterality Date  . ANTERIOR CERVICAL DECOMP/DISCECTOMY FUSION N/A 01/08/2013   Procedure: Cervical three-four, four-five, five-six anterior cervical decompression with fusion plating and bonegraft;  Surgeon: Eustace Moore, MD;  Location: [facility];  Service: Neurosurgery;  Laterality: N/A;  Cervical three-four, four-five, five-six anterior cervical decompression with fusion plating and bonegraft  . APPENDECTOMY    . BREAST LUMPECTOMY Left 2009   left breast  . CARDIAC CATHETERIZATION  2001  . CATARACT EXTRACTION, BILATERAL  2/11  . EYE SURGERY    . GANGLION CYST EXCISION Right    wrist  . sinus cyst removed    . TRANSCATHETER AORTIC VALVE REPLACEMENT, TRANSAORTIC  01/09/2019  . UTERINE FIBROID SURGERY      Family History  Problem Relation Age of Onset  . Hypertension Mother   . Lung cancer Mother        mets to brain  . Brain cancer Mother   . Tuberculosis Father        died at 21  . Coronary artery disease Neg Hx   . Diabetes Neg Hx   . Stroke Neg Hx   . Colon cancer Neg Hx   . Breast cancer Neg Hx     Social History:  reports that she has never smoked. She has never used smokeless tobacco. She reports current alcohol use. She reports that she does not use drugs.  Review of Systems    Lipid history: Usually has LDL below 100 Currently on Crestor   Lab Results  Component Value Date   CHOL 117 09/15/2019   HDL 40.40 09/15/2019   LDLCALC 56 09/15/2019   TRIG 100.0 09/15/2019   CHOLHDL 3 09/15/2019            Hypertension: She is on lisinopril 10 mg and 25 mg dose of Maxzide prescribed by her PCP with good control  Gastrointestinal: History of  Cirrhosis followed by gastroenterologist regularly   Has ?  gastroparesis with history of nausea with vomiting and is on a special diet, recently told to try not taking metoclopramide   Endocrine:  Long history of thyroid disease: Was told in her teen  years that she was hypothyroid and this may have been a sequela to possible radiation treatment in childhood in [place]  Currently on 100 mcg of levothyroxine  Labs:   Lab Results  Component Value Date   TSH 2.50 03/03/2020   TSH 4.95 (H) 09/15/2019   TSH 4.22 01/13/2019   FREET4 1.07 09/15/2019   FREET4 1.13 12/23/2017   FREET4 0.96 09/07/2016   Neuropathy: She has mild numbness in her feet Foot exam in 7/21 showed:  Decreased monofilament sensation on the toes and distal plantar surfaces of the right side  Physical Examination:  BP 138/68   Pulse 81   Ht 5\' 4"  (1.626 m)   Wt 156 lb 3.2 oz (70.9 kg)   SpO2 96%   BMI 26.81 kg/m        ASSESSMENT/PLAN:   Diabetes type 2, uncontrolled    See history of present illness for detailed discussion of current diabetes management, blood sugar patterns and problems identified  Her A1c is 6.9  However she does have insulin deficiency and is taking only basal insulin with Metformin mostly Still does not understand the need to check sugars after meals to help identify when she needs to take NovoLog Since she is taking some carbohydrates at dinnertime consistently she will need to take NovoLog before eating regardless of whether she is checking her sugars are not before eating As before checking her sugars very infrequently and hardly ever after meals  Discussed differences between basal and bolus insulin, need to take bolus insulin before at least her main meal with carbohydrates Also to take 5 to 8 units based on amount of carbohydrate, meal size and more for higher Premeal blood sugars as well as any desserts like ice cream She will also need to make sure she checks at least some readings at bedtime and discussed blood sugar targets after meals In the next couple of days since she is getting high readings with the steroids she will need to likely take NovoLog with every meal Discussed avoiding taking NovoLog postprandially  especially at bedtime  NEUROPATHY: Needs regular foot exam  Hyperlipidemia: Needs follow-up labs, currently on Crestor with adequate control  HYPOTHYROIDISM: Her TSH is normal, she will continue 100 mcg levothyroxine every morning  Patient Instructions  Must take Novolog before supper DAILY, 5-7 UNITS based on amount of Carbs  Check blood sugars on waking up days a week  Also check blood sugars about 2 hours after meals and do this after different meals by rotation  Recommended blood sugar levels on waking up are 90-130 and about 2 hours after meal is 130-180  Please bring your blood sugar monitor to each visit, thank you     Elayne Snare 03/09/2020, 1:42  PM   Note: This office note was prepared with Estate agent. Any transcriptional errors that result from this process are unintentional. `

## 2020-03-09 NOTE — Patient Instructions (Signed)
Must take Novolog before supper DAILY, 5-7 UNITS based on amount of Carbs  Check blood sugars on waking up days a week  Also check blood sugars about 2 hours after meals and do this after different meals by rotation  Recommended blood sugar levels on waking up are 90-130 and about 2 hours after meal is 130-180  Please bring your blood sugar monitor to each visit, thank you

## 2020-03-11 ENCOUNTER — Other Ambulatory Visit: Payer: Self-pay | Admitting: Neurological Surgery

## 2020-03-22 NOTE — Progress Notes (Signed)
Your procedure is scheduled on Friday, March 25, 2020.  Report to [facility] Main Entrance "A" at 10:20 A.M., and check in at the Admitting office.  Call this number if you have problems the morning of surgery:  [phone]  Call [phone] if you have any questions prior to your surgery date Monday-Friday 8am-4pm    Remember:  Do not eat or drink after midnight the night before your surgery    Take these medicines the morning of surgery with A SIP OF WATER:  amLODipine (NORVASC) carbidopa-levodopa (SINEMET IR) levothyroxine (SYNTHROID) rosuvastatin (CRESTOR) ondansetron (ZOFRAN) - if needed  As of today, STOP taking any Aspirin (unless otherwise instructed by your surgeon) Aleve, Naproxen, Ibuprofen, Motrin, Advil, Goody's, BC's, all herbal medications, fish oil, and all vitamins.   WHAT DO I DO ABOUT MY DIABETES MEDICATION?   Marland Kitchen Do not take oral diabetes medicines (metFORMIN (GLUCOPHAGE)) the morning of surgery.     . THE MORNING OF SURGERY, take ____17.5__ units of _insulin glargine (LANTUS SOLOSTAR)__insulin.   . If your CBG is greater than 220 mg/dL, you may take ,  (2.5 - 4 units) of your insulin aspart (NOVOLOG FLEXPEN).   HOW TO MANAGE YOUR DIABETES BEFORE AND AFTER SURGERY  Why is it important to control my blood sugar before and after surgery? . Improving blood sugar levels before and after surgery helps healing and can limit problems. . A way of improving blood sugar control is eating a healthy diet by: o  Eating less sugar and carbohydrates o  Increasing activity/exercise o  Talking with your doctor about reaching your blood sugar goals . High blood sugars (greater than 180 mg/dL) can raise your risk of infections and slow your recovery, so you will need to focus on controlling your diabetes during the weeks before surgery. . Make sure that the doctor who takes care of your diabetes knows about your planned surgery including the date and location.  How  do I manage my blood sugar before surgery? . Check your blood sugar at least 4 times a day, starting 2 days before surgery, to make sure that the level is not too high or low. . Check your blood sugar the morning of your surgery when you wake up and every 2 hours until you get to the Short Stay unit. o If your blood sugar is less than 70 mg/dL, you will need to treat for low blood sugar: - Do not take insulin. - Treat a low blood sugar (less than 70 mg/dL) with  cup of clear juice (cranberry or apple), 4 glucose tablets, OR glucose gel. - Recheck blood sugar in 15 minutes after treatment (to make sure it is greater than 70 mg/dL). If your blood sugar is not greater than 70 mg/dL on recheck, call [phone] for further instructions. . Report your blood sugar to the short stay nurse when you get to Short Stay.  . If you are admitted to the hospital after surgery: o Your blood sugar will be checked by the staff and you will probably be given insulin after surgery (instead of oral diabetes medicines) to make sure you have good blood sugar levels. o The goal for blood sugar control after surgery is 80-180 mg/dL.                      Do not wear jewelry, make up, or nail polish            Do not wear lotions, powders, perfumes,  or deodorant.            Do not shave 48 hours prior to surgery.            Do not bring valuables to the hospital.            [facility] is not responsible for any belongings or valuables.  Do NOT Smoke (Tobacco/Vaping) or drink Alcohol 24 hours prior to your procedure If you use a CPAP at night, you may bring all equipment for your overnight stay.   Contacts, glasses, dentures or bridgework may not be worn into surgery.      For patients admitted to the hospital, discharge time will be determined by your treatment team.   Patients discharged the day of surgery will not be allowed to drive home, and someone needs to stay with them for 24 hours.    Special  instructions:   [place]- Preparing For Surgery  Before surgery, you can play an important role. Because skin is not sterile, your skin needs to be as free of germs as possible. You can reduce the number of germs on your skin by washing with CHG (chlorahexidine gluconate) Soap before surgery.  CHG is an antiseptic cleaner which kills germs and bonds with the skin to continue killing germs even after washing.    Oral Hygiene is also important to reduce your risk of infection.  Remember - BRUSH YOUR TEETH THE MORNING OF SURGERY WITH YOUR REGULAR TOOTHPASTE  Please do not use if you have an allergy to CHG or antibacterial soaps. If your skin becomes reddened/irritated stop using the CHG.  Do not shave (including legs and underarms) for at least 48 hours prior to first CHG shower. It is OK to shave your face.  Please follow these instructions carefully.   1. Shower the NIGHT BEFORE SURGERY and the MORNING OF SURGERY with CHG Soap.   2. If you chose to wash your hair, wash your hair first as usual with your normal shampoo.  3. After you shampoo, rinse your hair and body thoroughly to remove the shampoo.  4. Use CHG as you would any other liquid soap. You can apply CHG directly to the skin and wash gently with a scrungie or a clean washcloth.   5. Apply the CHG Soap to your body ONLY FROM THE NECK DOWN.  Do not use on open wounds or open sores. Avoid contact with your eyes, ears, mouth and genitals (private parts). Wash Face and genitals (private parts)  with your normal soap.   6. Wash thoroughly, paying special attention to the area where your surgery will be performed.  7. Thoroughly rinse your body with warm water from the neck down.  8. DO NOT shower/wash with your normal soap after using and rinsing off the CHG Soap.  9. Pat yourself dry with a CLEAN TOWEL.  10. Wear CLEAN PAJAMAS to bed the night before surgery  11. Place CLEAN SHEETS on your bed the night of your first shower and  DO NOT SLEEP WITH PETS.   Day of Surgery: Wear Clean/Comfortable clothing the morning of surgery Do not apply any deodorants/lotions.   Remember to brush your teeth WITH YOUR REGULAR TOOTHPASTE.   Please read over the following fact sheets that you were given.

## 2020-03-23 ENCOUNTER — Other Ambulatory Visit (HOSPITAL_COMMUNITY)
Admission: RE | Admit: 2020-03-23 | Discharge: 2020-03-23 | Disposition: A | Discharge status: Home / Self Care | Payer: Medicare Other | Source: Ambulatory Visit | Attending: Neurological Surgery | Admitting: Neurological Surgery

## 2020-03-23 ENCOUNTER — Encounter (HOSPITAL_COMMUNITY)
Admission: RE | Admit: 2020-03-23 | Discharge: 2020-03-23 | Disposition: A | Discharge status: Home / Self Care | Payer: Medicare Other | Source: Ambulatory Visit | Attending: Neurological Surgery | Admitting: Neurological Surgery

## 2020-03-23 ENCOUNTER — Encounter (HOSPITAL_COMMUNITY): Payer: Self-pay

## 2020-03-23 ENCOUNTER — Other Ambulatory Visit: Payer: Self-pay

## 2020-03-23 DIAGNOSIS — Z20822 Contact with and (suspected) exposure to covid-19: Secondary | ICD-10-CM | POA: Insufficient documentation

## 2020-03-23 DIAGNOSIS — K219 Gastro-esophageal reflux disease without esophagitis: Secondary | ICD-10-CM | POA: Insufficient documentation

## 2020-03-23 DIAGNOSIS — Z853 Personal history of malignant neoplasm of breast: Secondary | ICD-10-CM | POA: Diagnosis not present

## 2020-03-23 DIAGNOSIS — K760 Fatty (change of) liver, not elsewhere classified: Secondary | ICD-10-CM | POA: Insufficient documentation

## 2020-03-23 DIAGNOSIS — I10 Essential (primary) hypertension: Secondary | ICD-10-CM | POA: Insufficient documentation

## 2020-03-23 DIAGNOSIS — Z01818 Encounter for other preprocedural examination: Secondary | ICD-10-CM | POA: Diagnosis present

## 2020-03-23 DIAGNOSIS — M542 Cervicalgia: Secondary | ICD-10-CM | POA: Diagnosis not present

## 2020-03-23 DIAGNOSIS — E119 Type 2 diabetes mellitus without complications: Secondary | ICD-10-CM | POA: Insufficient documentation

## 2020-03-23 DIAGNOSIS — K861 Other chronic pancreatitis: Secondary | ICD-10-CM | POA: Diagnosis not present

## 2020-03-23 HISTORY — DX: Anemia, unspecified: D64.9

## 2020-03-23 LAB — CBC WITH DIFFERENTIAL/PLATELET
Abs Immature Granulocytes: 0.03 10*3/uL (ref 0.00–0.07)
Basophils Absolute: 0 10*3/uL (ref 0.0–0.1)
Basophils Relative: 0 %
Eosinophils Absolute: 0 10*3/uL (ref 0.0–0.5)
Eosinophils Relative: 0 %
HCT: 35 % — ABNORMAL LOW (ref 36.0–46.0)
Hemoglobin: 11.1 g/dL — ABNORMAL LOW (ref 12.0–15.0)
Immature Granulocytes: 0 %
Lymphocytes Relative: 29 %
Lymphs Abs: 2.3 10*3/uL (ref 0.7–4.0)
MCH: 29.9 pg (ref 26.0–34.0)
MCHC: 31.7 g/dL (ref 30.0–36.0)
MCV: 94.3 fL (ref 80.0–100.0)
Monocytes Absolute: 0.5 10*3/uL (ref 0.1–1.0)
Monocytes Relative: 7 %
Neutro Abs: 5 10*3/uL (ref 1.7–7.7)
Neutrophils Relative %: 64 %
Platelets: 130 10*3/uL — ABNORMAL LOW (ref 150–400)
RBC: 3.71 MIL/uL — ABNORMAL LOW (ref 3.87–5.11)
RDW: 14.6 % (ref 11.5–15.5)
WBC: 7.8 10*3/uL (ref 4.0–10.5)
nRBC: 0 % (ref 0.0–0.2)

## 2020-03-23 LAB — BASIC METABOLIC PANEL
Anion gap: 15 (ref 5–15)
BUN: 21 mg/dL (ref 8–23)
CO2: 20 mmol/L — ABNORMAL LOW (ref 22–32)
Calcium: 9.9 mg/dL (ref 8.9–10.3)
Chloride: 104 mmol/L (ref 98–111)
Creatinine, Ser: 0.97 mg/dL (ref 0.44–1.00)
GFR, Estimated: 60 mL/min — ABNORMAL LOW (ref >60)
Glucose, Bld: 203 mg/dL — ABNORMAL HIGH (ref 70–99)
Potassium: 4.2 mmol/L (ref 3.5–5.1)
Sodium: 139 mmol/L (ref 135–145)

## 2020-03-23 LAB — SURGICAL PCR SCREEN
MRSA, PCR: NEGATIVE
Staphylococcus aureus: NEGATIVE

## 2020-03-23 LAB — GLUCOSE, CAPILLARY: Glucose-Capillary: 164 mg/dL — ABNORMAL HIGH (ref 70–99)

## 2020-03-23 LAB — SARS CORONAVIRUS 2 (TAT 6-24 HRS): SARS Coronavirus 2: NEGATIVE

## 2020-03-23 LAB — PROTIME-INR
INR: 1.2 (ref 0.8–1.2)
Prothrombin Time: 14.9 seconds (ref 11.4–15.2)

## 2020-03-23 NOTE — Progress Notes (Signed)
Your procedure is scheduled on Friday, March 25, 2020.  Report to [facility] Main Entrance "A" at 10:20 A.M., and check in at the Admitting office.  Call this number if you have problems the morning of surgery:  [phone]  Call [phone] if you have any questions prior to your surgery date Monday-Friday 8am-4pm    Remember:  Do not eat or drink after midnight the night before your surgery    Take these medicines the morning of surgery with A SIP OF WATER:  amLODipine (NORVASC) carbidopa-levodopa (SINEMET IR) levothyroxine (SYNTHROID) rosuvastatin (CRESTOR) ondansetron (ZOFRAN) - if needed  As of today, STOP taking any Aspirin (unless otherwise instructed by your surgeon) Aleve, Naproxen, Ibuprofen, Motrin, Advil, Goody's, BC's, all herbal medications, fish oil, and all vitamins.   WHAT DO I DO ABOUT MY DIABETES MEDICATION?   Marland Kitchen Do not take oral diabetes medicines (metFORMIN (GLUCOPHAGE)) the morning of surgery.     . THE MORNING OF SURGERY, take ____17.5__ units of _insulin glargine (LANTUS SOLOSTAR)__insulin.   . If your CBG is greater than 220 mg/dL, you may take ,  (3 units) of your insulin aspart (NOVOLOG FLEXPEN).  . The NIGHT BEFORE - do NOT take bedtime dose of Novolog   HOW TO MANAGE YOUR DIABETES BEFORE AND AFTER SURGERY  Why is it important to control my blood sugar before and after surgery? . Improving blood sugar levels before and after surgery helps healing and can limit problems. . A way of improving blood sugar control is eating a healthy diet by: o  Eating less sugar and carbohydrates o  Increasing activity/exercise o  Talking with your doctor about reaching your blood sugar goals . High blood sugars (greater than 180 mg/dL) can raise your risk of infections and slow your recovery, so you will need to focus on controlling your diabetes during the weeks before surgery. . Make sure that the doctor who takes care of your diabetes knows about your  planned surgery including the date and location.  How do I manage my blood sugar before surgery? . Check your blood sugar at least 4 times a day, starting 2 days before surgery, to make sure that the level is not too high or low. . Check your blood sugar the morning of your surgery when you wake up and every 2 hours until you get to the Short Stay unit. o If your blood sugar is less than 70 mg/dL, you will need to treat for low blood sugar: - Do not take insulin. - Treat a low blood sugar (less than 70 mg/dL) with  cup of clear juice (cranberry or apple), 4 glucose tablets, OR glucose gel. - Recheck blood sugar in 15 minutes after treatment (to make sure it is greater than 70 mg/dL). If your blood sugar is not greater than 70 mg/dL on recheck, call [phone] for further instructions. . Report your blood sugar to the short stay nurse when you get to Short Stay.  . If you are admitted to the hospital after surgery: o Your blood sugar will be checked by the staff and you will probably be given insulin after surgery (instead of oral diabetes medicines) to make sure you have good blood sugar levels. o The goal for blood sugar control after surgery is 80-180 mg/dL.                      Do not wear jewelry, make up, or nail polish  Do not wear lotions, powders, perfumes, or deodorant.            Do not shave 48 hours prior to surgery.            Do not bring valuables to the hospital.            [facility] is not responsible for any belongings or valuables.  Do NOT Smoke (Tobacco/Vaping) or drink Alcohol 24 hours prior to your procedure If you use a CPAP at night, you may bring all equipment for your overnight stay.   Contacts, glasses, dentures or bridgework may not be worn into surgery.      For patients admitted to the hospital, discharge time will be determined by your treatment team.   Patients discharged the day of surgery will not be allowed to drive home, and someone  needs to stay with them for 24 hours.    Special instructions:   - Preparing For Surgery  Before surgery, you can play an important role. Because skin is not sterile, your skin needs to be as free of germs as possible. You can reduce the number of germs on your skin by washing with CHG (chlorahexidine gluconate) Soap before surgery.  CHG is an antiseptic cleaner which kills germs and bonds with the skin to continue killing germs even after washing.    Oral Hygiene is also important to reduce your risk of infection.  Remember - BRUSH YOUR TEETH THE MORNING OF SURGERY WITH YOUR REGULAR TOOTHPASTE  Please do not use if you have an allergy to CHG or antibacterial soaps. If your skin becomes reddened/irritated stop using the CHG.  Do not shave (including legs and underarms) for at least 48 hours prior to first CHG shower. It is OK to shave your face.  Please follow these instructions carefully.   1. Shower the NIGHT BEFORE SURGERY and the MORNING OF SURGERY with CHG Soap.   2. If you chose to wash your hair, wash your hair first as usual with your normal shampoo.  3. After you shampoo, rinse your hair and body thoroughly to remove the shampoo.  4. Use CHG as you would any other liquid soap. You can apply CHG directly to the skin and wash gently with a scrungie or a clean washcloth.   5. Apply the CHG Soap to your body ONLY FROM THE NECK DOWN.  Do not use on open wounds or open sores. Avoid contact with your eyes, ears, mouth and genitals (private parts). Wash Face and genitals (private parts)  with your normal soap.   6. Wash thoroughly, paying special attention to the area where your surgery will be performed.  7. Thoroughly rinse your body with warm water from the neck down.  8. DO NOT shower/wash with your normal soap after using and rinsing off the CHG Soap.  9. Pat yourself dry with a CLEAN TOWEL.  10. Wear CLEAN PAJAMAS to bed the night before surgery  11. Place CLEAN  SHEETS on your bed the night of your first shower and DO NOT SLEEP WITH PETS.   Day of Surgery: Wear Clean/Comfortable clothing the morning of surgery Do not apply any deodorants/lotions.   Remember to brush your teeth WITH YOUR REGULAR TOOTHPASTE.   Please read over the following fact sheets that you were given.

## 2020-03-23 NOTE — Progress Notes (Signed)
PCP - [facility] Cardiologist - Dr. Doneta Public & Dr. Laurell Roof [facility]) Cardiac clearance received, see notes in Care Everywhere  Chest x-ray - 03-23-20 EKG - 03-23-20 ECHO - 2015  DM - Type 2 Fasting Blood Sugar - 100-120  ASA: patient stopped taking several weeks ago  COVID TEST- 03-23-20   Anesthesia review: yes, heart history Cardiac clearance received, see notes in Care Everywhere  Patient denies shortness of breath, fever, cough and chest pain at PAT appointment   All instructions explained to the patient, with a verbal understanding of the material. Patient agrees to go over the instructions while at home for a better understanding. Patient also instructed to self quarantine after being tested for COVID-19. The opportunity to ask questions was provided.   Patient refuses all blood products.  Refusal of all blood and blood products in patient's chart. No T&S drawn at PAT appointment

## 2020-03-24 NOTE — Anesthesia Preprocedure Evaluation (Addendum)
Anesthesia Evaluation  Patient identified by MRN, date of birth, ID band Patient awake    Reviewed: Patient's Chart, lab work & pertinent test results  Airway Mallampati: II  TM Distance: >3 FB Neck ROM: Full    Dental  (+) Teeth Intact   Pulmonary neg pulmonary ROS,    Pulmonary exam normal        Cardiovascular hypertension, Pt. on medications + Valvular Problems/Murmurs AS  Rhythm:Regular Rate:Normal     Neuro/Psych  Headaches, Anxiety    GI/Hepatic GERD  ,Hemorrhoids, diverticulosis, polyps   Endo/Other  diabetes, Well Controlled, Type 2, Insulin Dependent, Oral Hypoglycemic AgentsHypothyroidism   Renal/GU   negative genitourinary   Musculoskeletal  (+) Arthritis ,   Abdominal (+)  Abdomen: soft. Bowel sounds: normal.  Peds  Hematology  (+) anemia , REFUSES BLOOD PRODUCTS, JEHOVAH'S WITNESS  Anesthesia Other Findings   Reproductive/Obstetrics                           Anesthesia Physical Anesthesia Plan  ASA: III  Anesthesia Plan: General   Post-op Pain Management:    Induction: Intravenous  PONV Risk Score and Plan: 3 and Ondansetron and Treatment may vary due to age or medical condition  Airway Management Planned: Mask and Oral ETT  Additional Equipment: None  Intra-op Plan:   Post-operative Plan: Extubation in OR  Informed Consent: I have reviewed the patients History and Physical, chart, labs and discussed the procedure including the risks, benefits and alternatives for the proposed anesthesia with the patient or authorized representative who has indicated his/her understanding and acceptance.     Dental advisory given  Plan Discussed with:   Anesthesia Plan Comments: (PT JEHOVAH'S WITNESS refuses blood products. Amenable to albumin and cell saver. Lab Results      Component                Value               Date                      WBC                       7.8                 03/23/2020                HGB                      11.1 (L)            03/23/2020                HCT                      35.0 (L)            03/23/2020                MCV                      94.3                03/23/2020                PLT  130 (L)             03/23/2020            PAT note by Karoline Caldwell, PA-C: Follows with cardiology at [facility] for hx of HTN, AS s/p TAVR August 2020.  She had angiographically normal coronaries by catheterization 12/19/2018.  Last seen by Dr. Doneta Public 03/22/2020 and surgical clearance was addressed.  Per note, "Overall, doing well from a CV standpoint without symptoms of angina or decompensated HF or symptoms to suggest arrhythmia. Exam is consistent with a warm and well-perfused state and there is no evidence of volume overload. The blood pressure is well controlled at home and clinic Scheduled for surgery at [facility] later this week and is cleared for this procedure from CV standpoint."  She was advised to follow-up in 1 year.  IDDM 2 followed by endocrinologist Dr. Dwyane Dee.  Last A1c 6.9 on 03/03/2020.  Preop labs reviewed, mild anemia with hemoglobin 11.1, elevated glucose 203.  Otherwise unremarkable.  EKG 03/23/2020: Normal sinus rhythm.  Rate 86.  Left axis deviation . Left ventricular hypertrophy with repolarization abnormality ( R in aVL , Cornell product ). Cannot rule out Septal infarct , age undetermined  TTE 01/12/2020 (Care Everywhere): SUMMARY  The left ventricular size is normal.  Left ventricular systolic function is normal.  The right ventricle is normal in size and function.  The left atrial size is normal.  Right atrial size is normal.  Pt is s/p 01/08/2019 75mm Sapien TAVR.  Status post TAVR with no paravalvular regurgitation.  There is mild mitral annular calcification.  Diffuse thickening of the mitral leaflet with preserved opening.  There is no pericardial effusion.  Probably no significant change in  comparison with the prior study  noted    Nuclear stress 12/10/2019 (Care Everywhere): 1.) LIMITATIONS: None.  2.) MYOCARDIAL PERFUSION: Normal.  3.) LEFT VENTRICULAR EJECTION FRACTION: Normal.  4.) REGIONAL WALL MOTION: Normal.  )      Anesthesia Quick Evaluation

## 2020-03-24 NOTE — Progress Notes (Signed)
Anesthesia Chart Review:  Follows with cardiology at [facility] for hx of HTN, AS s/p TAVR August 2020.  She had angiographically normal coronaries by catheterization 12/19/2018.  Last seen by Dr. Doneta Public 03/22/2020 and surgical clearance was addressed.  Per note, "Overall, doing well from a CV standpoint without symptoms of angina or decompensated HF or symptoms to suggest arrhythmia. Exam is consistent with a warm and well-perfused state and there is no evidence of volume overload. The blood pressure is well controlled at home and clinic Scheduled for surgery at [facility] later this week and is cleared for this procedure from CV standpoint."  She was advised to follow-up in 1 year.  IDDM 2 followed by endocrinologist Dr. Dwyane Dee.  Last A1c 6.9 on 03/03/2020.  Preop labs reviewed, mild anemia with hemoglobin 11.1, elevated glucose 203.  Otherwise unremarkable.  EKG 03/23/2020: Normal sinus rhythm.  Rate 86.  Left axis deviation . Left ventricular hypertrophy with repolarization abnormality ( R in aVL , Cornell product ). Cannot rule out Septal infarct , age undetermined  TTE 01/12/2020 (Care Everywhere): SUMMARY  The left ventricular size is normal.  Left ventricular systolic function is normal.  The right ventricle is normal in size and function.  The left atrial size is normal.  Right atrial size is normal.  Pt is s/p 01/08/2019 17mm Sapien TAVR.  Status post TAVR with no paravalvular regurgitation.  There is mild mitral annular calcification.  Diffuse thickening of the mitral leaflet with preserved opening.  There is no pericardial effusion.  Probably no significant change in comparison with the prior study  noted    Nuclear stress 12/10/2019 (Care Everywhere): 1.) LIMITATIONS: None.  2.) MYOCARDIAL PERFUSION: Normal.  3.) LEFT VENTRICULAR EJECTION FRACTION: Normal.  4.) REGIONAL WALL MOTION: Normal.  Sharon Armstrong [facility] Short Stay Center/Anesthesiology Phone [phone] 03/24/2020 9:11  AM

## 2020-03-25 ENCOUNTER — Encounter (HOSPITAL_COMMUNITY)
Admission: RE | Disposition: A | Discharge status: Home / Self Care | Payer: Self-pay | Source: Home / Self Care | Attending: Neurological Surgery

## 2020-03-25 ENCOUNTER — Other Ambulatory Visit: Payer: Self-pay

## 2020-03-25 ENCOUNTER — Encounter (HOSPITAL_COMMUNITY): Payer: Self-pay | Admitting: Neurological Surgery

## 2020-03-25 ENCOUNTER — Ambulatory Visit (HOSPITAL_COMMUNITY): Payer: Medicare Other

## 2020-03-25 ENCOUNTER — Ambulatory Visit (HOSPITAL_COMMUNITY)
Admission: RE | Admit: 2020-03-25 | Discharge: 2020-03-26 | Disposition: A | Discharge status: Home / Self Care | Payer: Medicare Other | Attending: Neurological Surgery | Admitting: Neurological Surgery

## 2020-03-25 ENCOUNTER — Ambulatory Visit (HOSPITAL_COMMUNITY): Payer: Medicare Other | Admitting: Anesthesiology

## 2020-03-25 ENCOUNTER — Ambulatory Visit (HOSPITAL_COMMUNITY): Payer: Medicare Other | Admitting: Physician Assistant

## 2020-03-25 DIAGNOSIS — M4802 Spinal stenosis, cervical region: Secondary | ICD-10-CM | POA: Diagnosis present

## 2020-03-25 DIAGNOSIS — M4712 Other spondylosis with myelopathy, cervical region: Secondary | ICD-10-CM | POA: Diagnosis not present

## 2020-03-25 DIAGNOSIS — Z79899 Other long term (current) drug therapy: Secondary | ICD-10-CM | POA: Diagnosis not present

## 2020-03-25 DIAGNOSIS — Z794 Long term (current) use of insulin: Secondary | ICD-10-CM | POA: Diagnosis not present

## 2020-03-25 DIAGNOSIS — Z981 Arthrodesis status: Secondary | ICD-10-CM | POA: Diagnosis not present

## 2020-03-25 DIAGNOSIS — Z888 Allergy status to other drugs, medicaments and biological substances status: Secondary | ICD-10-CM | POA: Insufficient documentation

## 2020-03-25 DIAGNOSIS — Z7982 Long term (current) use of aspirin: Secondary | ICD-10-CM | POA: Diagnosis not present

## 2020-03-25 DIAGNOSIS — Z79890 Hormone replacement therapy (postmenopausal): Secondary | ICD-10-CM | POA: Insufficient documentation

## 2020-03-25 DIAGNOSIS — Z791 Long term (current) use of non-steroidal anti-inflammatories (NSAID): Secondary | ICD-10-CM | POA: Diagnosis not present

## 2020-03-25 DIAGNOSIS — Z91041 Radiographic dye allergy status: Secondary | ICD-10-CM | POA: Diagnosis not present

## 2020-03-25 DIAGNOSIS — Z884 Allergy status to anesthetic agent status: Secondary | ICD-10-CM | POA: Insufficient documentation

## 2020-03-25 DIAGNOSIS — Z419 Encounter for procedure for purposes other than remedying health state, unspecified: Secondary | ICD-10-CM

## 2020-03-25 DIAGNOSIS — Z886 Allergy status to analgesic agent status: Secondary | ICD-10-CM | POA: Diagnosis not present

## 2020-03-25 HISTORY — PX: ANTERIOR CERVICAL DECOMP/DISCECTOMY FUSION: SHX1161

## 2020-03-25 LAB — GLUCOSE, CAPILLARY
Glucose-Capillary: 133 mg/dL — ABNORMAL HIGH (ref 70–99)
Glucose-Capillary: 88 mg/dL (ref 70–99)
Glucose-Capillary: 130 mg/dL — ABNORMAL HIGH (ref 70–99)
Glucose-Capillary: 187 mg/dL — ABNORMAL HIGH (ref 70–99)

## 2020-03-25 SURGERY — ANTERIOR CERVICAL DECOMPRESSION/DISCECTOMY FUSION 1 LEVEL/HARDWARE REMOVAL
Anesthesia: General | Site: Spine Cervical

## 2020-03-25 MED ORDER — LIDOCAINE 2% (20 MG/ML) 5 ML SYRINGE
INTRAMUSCULAR | Status: AC
  Filled 2020-03-25: qty 5

## 2020-03-25 MED ORDER — SODIUM CHLORIDE 0.9% FLUSH: 3.0000 mL | INTRAVENOUS | Status: DC | PRN

## 2020-03-25 MED ORDER — CEFAZOLIN SODIUM-DEXTROSE 2-4 GM/100ML-% IV SOLN
INTRAVENOUS | Status: AC
  Filled 2020-03-25: qty 100

## 2020-03-25 MED ORDER — THROMBIN 20000 UNITS EX SOLR: CUTANEOUS | Status: DC | PRN

## 2020-03-25 MED ORDER — METHOCARBAMOL 500 MG PO TABS
500.0000 mg | ORAL_TABLET | Freq: Four times a day (QID) | ORAL | Status: DC | PRN
  Administered 2020-03-25 – 2020-03-26 (×2): 500 mg via ORAL
  Filled 2020-03-25: qty 1

## 2020-03-25 MED ORDER — CHLORHEXIDINE GLUCONATE 0.12 % MT SOLN
OROMUCOSAL | Status: AC
  Administered 2020-03-25: 15 mL via OROMUCOSAL
  Filled 2020-03-25: qty 15

## 2020-03-25 MED ORDER — DEXAMETHASONE SODIUM PHOSPHATE 10 MG/ML IJ SOLN
10.0000 mg | Freq: Once | INTRAMUSCULAR | Status: AC
  Administered 2020-03-25: 10 mg via INTRAVENOUS

## 2020-03-25 MED ORDER — ACETAMINOPHEN 10 MG/ML IV SOLN: 1000.0000 mg | Freq: Once | INTRAVENOUS | Status: DC | PRN

## 2020-03-25 MED ORDER — INSULIN ASPART 100 UNIT/ML ~~LOC~~ SOLN: 0.0000 [IU] | Freq: Every day | SUBCUTANEOUS | Status: DC

## 2020-03-25 MED ORDER — POTASSIUM CHLORIDE IN NACL 20-0.9 MEQ/L-% IV SOLN: INTRAVENOUS | Status: DC

## 2020-03-25 MED ORDER — LEVOTHYROXINE SODIUM 100 MCG PO TABS
100.0000 ug | ORAL_TABLET | Freq: Every day | ORAL | Status: DC
  Administered 2020-03-26: 100 ug via ORAL
  Filled 2020-03-25: qty 1

## 2020-03-25 MED ORDER — PROPOFOL 10 MG/ML IV BOLUS
INTRAVENOUS | Status: AC
  Filled 2020-03-25: qty 20

## 2020-03-25 MED ORDER — GLYCOPYRROLATE 0.2 MG/ML IJ SOLN
INTRAMUSCULAR | Status: DC | PRN
  Administered 2020-03-25: .1 mg via INTRAVENOUS

## 2020-03-25 MED ORDER — SUGAMMADEX SODIUM 200 MG/2ML IV SOLN
INTRAVENOUS | Status: DC | PRN
  Administered 2020-03-25 (×3): 50 mg via INTRAVENOUS

## 2020-03-25 MED ORDER — PHENOL 1.4 % MT LIQD: 1.0000 | OROMUCOSAL | Status: DC | PRN

## 2020-03-25 MED ORDER — OXYCODONE HCL 5 MG/5ML PO SOLN: 5.0000 mg | Freq: Once | ORAL | Status: AC | PRN

## 2020-03-25 MED ORDER — ROCURONIUM BROMIDE 10 MG/ML (PF) SYRINGE
PREFILLED_SYRINGE | INTRAVENOUS | Status: AC
  Filled 2020-03-25: qty 10

## 2020-03-25 MED ORDER — ACETAMINOPHEN 650 MG RE SUPP: 650.0000 mg | RECTAL | Status: DC | PRN

## 2020-03-25 MED ORDER — ONDANSETRON HCL 4 MG PO TABS: 4.0000 mg | ORAL_TABLET | Freq: Four times a day (QID) | ORAL | Status: DC | PRN

## 2020-03-25 MED ORDER — CEFAZOLIN SODIUM-DEXTROSE 2-4 GM/100ML-% IV SOLN
2.0000 g | INTRAVENOUS | Status: AC
  Administered 2020-03-25: 2 g via INTRAVENOUS

## 2020-03-25 MED ORDER — CEFAZOLIN SODIUM-DEXTROSE 2-4 GM/100ML-% IV SOLN
2.0000 g | Freq: Three times a day (TID) | INTRAVENOUS | Status: AC
  Administered 2020-03-25 – 2020-03-26 (×2): 2 g via INTRAVENOUS
  Filled 2020-03-25 (×2): qty 100

## 2020-03-25 MED ORDER — THROMBIN 5000 UNITS EX SOLR
CUTANEOUS | Status: AC
  Filled 2020-03-25: qty 5000

## 2020-03-25 MED ORDER — PHENYLEPHRINE 40 MCG/ML (10ML) SYRINGE FOR IV PUSH (FOR BLOOD PRESSURE SUPPORT)
PREFILLED_SYRINGE | INTRAVENOUS | Status: AC
  Filled 2020-03-25: qty 20

## 2020-03-25 MED ORDER — CHLORHEXIDINE GLUCONATE 0.12 % MT SOLN
15.0000 mL | Freq: Once | OROMUCOSAL | Status: AC
  Filled 2020-03-25: qty 15

## 2020-03-25 MED ORDER — METHOCARBAMOL 500 MG PO TABS
ORAL_TABLET | ORAL | Status: AC
  Filled 2020-03-25: qty 1

## 2020-03-25 MED ORDER — CHLORHEXIDINE GLUCONATE CLOTH 2 % EX PADS: 6.0000 | MEDICATED_PAD | Freq: Once | CUTANEOUS | Status: DC

## 2020-03-25 MED ORDER — METFORMIN HCL 500 MG PO TABS
1000.0000 mg | ORAL_TABLET | Freq: Two times a day (BID) | ORAL | Status: DC
  Administered 2020-03-25 – 2020-03-26 (×2): 1000 mg via ORAL
  Filled 2020-03-25 (×2): qty 2

## 2020-03-25 MED ORDER — ONDANSETRON HCL 4 MG/2ML IJ SOLN: 4.0000 mg | Freq: Four times a day (QID) | INTRAMUSCULAR | Status: DC | PRN

## 2020-03-25 MED ORDER — CARBIDOPA-LEVODOPA 25-250 MG PO TABS
1.0000 | ORAL_TABLET | Freq: Four times a day (QID) | ORAL | Status: DC
  Administered 2020-03-25 – 2020-03-26 (×2): 1 via ORAL
  Filled 2020-03-25 (×6): qty 1

## 2020-03-25 MED ORDER — FENTANYL CITRATE (PF) 250 MCG/5ML IJ SOLN
INTRAMUSCULAR | Status: DC | PRN
  Administered 2020-03-25: 50 ug via INTRAVENOUS
  Administered 2020-03-25 (×2): 25 ug via INTRAVENOUS
  Administered 2020-03-25 (×2): 50 ug via INTRAVENOUS

## 2020-03-25 MED ORDER — LISINOPRIL 20 MG PO TABS
40.0000 mg | ORAL_TABLET | Freq: Every day | ORAL | Status: DC
  Administered 2020-03-26: 40 mg via ORAL
  Filled 2020-03-25: qty 2

## 2020-03-25 MED ORDER — AMLODIPINE BESYLATE 5 MG PO TABS
5.0000 mg | ORAL_TABLET | Freq: Every day | ORAL | Status: DC
  Administered 2020-03-25 – 2020-03-26 (×2): 5 mg via ORAL
  Filled 2020-03-25 (×2): qty 1

## 2020-03-25 MED ORDER — OXYCODONE HCL 5 MG PO TABS
5.0000 mg | ORAL_TABLET | Freq: Once | ORAL | Status: AC | PRN
  Administered 2020-03-25: 5 mg via ORAL

## 2020-03-25 MED ORDER — MORPHINE SULFATE (PF) 2 MG/ML IV SOLN: 2.0000 mg | INTRAVENOUS | Status: DC | PRN

## 2020-03-25 MED ORDER — TRIAMTERENE-HCTZ 37.5-25 MG PO TABS
1.0000 | ORAL_TABLET | Freq: Every day | ORAL | Status: DC
  Administered 2020-03-26: 1 via ORAL
  Filled 2020-03-25: qty 1

## 2020-03-25 MED ORDER — SODIUM CHLORIDE 0.9% FLUSH
3.0000 mL | Freq: Two times a day (BID) | INTRAVENOUS | Status: DC
  Administered 2020-03-25: 3 mL via INTRAVENOUS

## 2020-03-25 MED ORDER — DEXAMETHASONE SODIUM PHOSPHATE 10 MG/ML IJ SOLN
INTRAMUSCULAR | Status: AC
  Filled 2020-03-25: qty 1

## 2020-03-25 MED ORDER — THROMBIN 20000 UNITS EX SOLR
CUTANEOUS | Status: AC
  Filled 2020-03-25: qty 40000

## 2020-03-25 MED ORDER — ONDANSETRON HCL 4 MG/2ML IJ SOLN
INTRAMUSCULAR | Status: DC | PRN
  Administered 2020-03-25: 4 mg via INTRAVENOUS

## 2020-03-25 MED ORDER — 0.9 % SODIUM CHLORIDE (POUR BTL) OPTIME
TOPICAL | Status: DC | PRN
  Administered 2020-03-25: 1000 mL

## 2020-03-25 MED ORDER — HYDROCODONE-ACETAMINOPHEN 5-325 MG PO TABS
1.0000 | ORAL_TABLET | ORAL | Status: DC | PRN
  Administered 2020-03-25 – 2020-03-26 (×3): 1 via ORAL
  Filled 2020-03-25 (×3): qty 1

## 2020-03-25 MED ORDER — HYDROMORPHONE HCL 1 MG/ML IJ SOLN: 0.2500 mg | INTRAMUSCULAR | Status: DC | PRN

## 2020-03-25 MED ORDER — INSULIN ASPART 100 UNIT/ML ~~LOC~~ SOLN: 0.0000 [IU] | Freq: Three times a day (TID) | SUBCUTANEOUS | Status: DC

## 2020-03-25 MED ORDER — ROCURONIUM BROMIDE 10 MG/ML (PF) SYRINGE
PREFILLED_SYRINGE | INTRAVENOUS | Status: DC | PRN
  Administered 2020-03-25: 30 mg via INTRAVENOUS
  Administered 2020-03-25: 50 mg via INTRAVENOUS

## 2020-03-25 MED ORDER — INSULIN ASPART 100 UNIT/ML ~~LOC~~ SOLN
0.0000 [IU] | Freq: Three times a day (TID) | SUBCUTANEOUS | Status: DC
  Administered 2020-03-26: 3 [IU] via SUBCUTANEOUS

## 2020-03-25 MED ORDER — METHOCARBAMOL 1000 MG/10ML IJ SOLN
500.0000 mg | Freq: Four times a day (QID) | INTRAVENOUS | Status: DC | PRN
  Filled 2020-03-25: qty 5

## 2020-03-25 MED ORDER — ONDANSETRON HCL 4 MG/2ML IJ SOLN
INTRAMUSCULAR | Status: AC
  Filled 2020-03-25: qty 2

## 2020-03-25 MED ORDER — PHENYLEPHRINE HCL-NACL 10-0.9 MG/250ML-% IV SOLN
INTRAVENOUS | Status: DC | PRN
  Administered 2020-03-25: 40 ug/min via INTRAVENOUS

## 2020-03-25 MED ORDER — BUPIVACAINE HCL (PF) 0.25 % IJ SOLN
INTRAMUSCULAR | Status: DC | PRN
  Administered 2020-03-25: 5 mL

## 2020-03-25 MED ORDER — OXYCODONE HCL 5 MG PO TABS
ORAL_TABLET | ORAL | Status: AC
  Filled 2020-03-25: qty 1

## 2020-03-25 MED ORDER — BUPIVACAINE HCL (PF) 0.25 % IJ SOLN
INTRAMUSCULAR | Status: AC
  Filled 2020-03-25: qty 30

## 2020-03-25 MED ORDER — LACTATED RINGERS IV SOLN: INTRAVENOUS | Status: DC

## 2020-03-25 MED ORDER — ACETAMINOPHEN 325 MG PO TABS: 650.0000 mg | ORAL_TABLET | ORAL | Status: DC | PRN

## 2020-03-25 MED ORDER — ONDANSETRON HCL 4 MG/2ML IJ SOLN: 4.0000 mg | Freq: Once | INTRAMUSCULAR | Status: DC | PRN

## 2020-03-25 MED ORDER — PROPOFOL 10 MG/ML IV BOLUS
INTRAVENOUS | Status: DC | PRN
  Administered 2020-03-25: 30 mg via INTRAVENOUS
  Administered 2020-03-25: 40 mg via INTRAVENOUS
  Administered 2020-03-25: 130 mg via INTRAVENOUS
  Administered 2020-03-25: 30 mg via INTRAVENOUS

## 2020-03-25 MED ORDER — LIDOCAINE 2% (20 MG/ML) 5 ML SYRINGE
INTRAMUSCULAR | Status: DC | PRN
  Administered 2020-03-25: 60 mg via INTRAVENOUS
  Administered 2020-03-25: 40 mg via INTRAVENOUS

## 2020-03-25 MED ORDER — SENNA 8.6 MG PO TABS
1.0000 | ORAL_TABLET | Freq: Two times a day (BID) | ORAL | Status: DC
  Administered 2020-03-25 – 2020-03-26 (×2): 8.6 mg via ORAL
  Filled 2020-03-25 (×2): qty 1

## 2020-03-25 MED ORDER — SODIUM CHLORIDE 0.9 % IV SOLN: 250.0000 mL | INTRAVENOUS | Status: DC

## 2020-03-25 MED ORDER — MENTHOL 3 MG MT LOZG: 1.0000 | LOZENGE | OROMUCOSAL | Status: DC | PRN

## 2020-03-25 MED ORDER — FENTANYL CITRATE (PF) 250 MCG/5ML IJ SOLN
INTRAMUSCULAR | Status: AC
  Filled 2020-03-25: qty 5

## 2020-03-25 SURGICAL SUPPLY — 46 items
APPLIER CLIP 11 MED OPEN (CLIP) ×2
BAND RUBBER #18 3X1/16 STRL (MISCELLANEOUS) ×4 IMPLANT
BASKET BONE COLLECTION (BASKET) ×2 IMPLANT
BENZOIN TINCTURE PRP APPL 2/3 (GAUZE/BANDAGES/DRESSINGS) ×2 IMPLANT
BIT DRILL ACP 13 (DRILL) ×1 IMPLANT
BUR CARBIDE MATCH 3.0 (BURR) ×2 IMPLANT
CAGE CERV COHERE 7X14X12 7D (Cage) ×2 IMPLANT
CANISTER SUCT 3000ML PPV (MISCELLANEOUS) ×2 IMPLANT
CLIP APPLIE 11 MED OPEN (CLIP) ×1 IMPLANT
DRAPE C-ARM 42X72 X-RAY (DRAPES) ×4 IMPLANT
DRAPE LAPAROTOMY 100X72 PEDS (DRAPES) ×2 IMPLANT
DRAPE MICROSCOPE LEICA (MISCELLANEOUS) ×2 IMPLANT
DRILL ACP 13 (DRILL) ×2
DRSG OPSITE POSTOP 3X4 (GAUZE/BANDAGES/DRESSINGS) ×2 IMPLANT
ELECT COATED BLADE 2.86 ST (ELECTRODE) ×2 IMPLANT
ELECT REM PT RETURN 9FT ADLT (ELECTROSURGICAL) ×2
ELECTRODE REM PT RTRN 9FT ADLT (ELECTROSURGICAL) ×1 IMPLANT
GLOVE BIO SURGEON STRL SZ7 (GLOVE) ×2 IMPLANT
GLOVE BIO SURGEON STRL SZ8 (GLOVE) ×2 IMPLANT
GLOVE BIOGEL PI IND STRL 7.0 (GLOVE) ×3 IMPLANT
GLOVE BIOGEL PI IND STRL 7.5 (GLOVE) ×2 IMPLANT
GLOVE BIOGEL PI INDICATOR 7.0 (GLOVE) ×3
GLOVE BIOGEL PI INDICATOR 7.5 (GLOVE) ×2
GOWN STRL REUS W/ TWL LRG LVL3 (GOWN DISPOSABLE) ×2 IMPLANT
GOWN STRL REUS W/ TWL XL LVL3 (GOWN DISPOSABLE) IMPLANT
GOWN STRL REUS W/TWL 2XL LVL3 (GOWN DISPOSABLE) ×4 IMPLANT
GOWN STRL REUS W/TWL LRG LVL3 (GOWN DISPOSABLE) ×4
GOWN STRL REUS W/TWL XL LVL3 (GOWN DISPOSABLE)
KIT BASIN OR (CUSTOM PROCEDURE TRAY) ×2 IMPLANT
KIT TURNOVER KIT B (KITS) ×2 IMPLANT
NEEDLE HYPO 25X1 1.5 SAFETY (NEEDLE) ×2 IMPLANT
NEEDLE SPNL 20GX3.5 QUINCKE YW (NEEDLE) ×2 IMPLANT
NS IRRIG 1000ML POUR BTL (IV SOLUTION) ×2 IMPLANT
PACK LAMINECTOMY NEURO (CUSTOM PROCEDURE TRAY) ×2 IMPLANT
PIN DISTRACTION 14MM (PIN) IMPLANT
PLATE ACP 1-LEVEL1.6V22 (Plate) ×2 IMPLANT
SCREW ACP ST VARI 3.5X13 (Screw) ×4 IMPLANT
SCREW ACP VA ST 4X13 (Screw) ×4 IMPLANT
SPONGE INTESTINAL PEANUT (DISPOSABLE) ×2 IMPLANT
SPONGE SURGIFOAM ABS GEL 100 (HEMOSTASIS) ×2 IMPLANT
STRIP CLOSURE SKIN 1/2X4 (GAUZE/BANDAGES/DRESSINGS) ×2 IMPLANT
SUT VIC AB 3-0 SH 8-18 (SUTURE) ×2 IMPLANT
SUT VICRYL 4-0 PS2 18IN ABS (SUTURE) IMPLANT
TOWEL GREEN STERILE (TOWEL DISPOSABLE) ×2 IMPLANT
TOWEL GREEN STERILE FF (TOWEL DISPOSABLE) ×2 IMPLANT
WATER STERILE IRR 1000ML POUR (IV SOLUTION) ×2 IMPLANT

## 2020-03-25 NOTE — H&P (Signed)
Subjective:   Patient is a 79 y.o. female admitted for myelopathy. The patient first presented to me with complaints of neck pain and numbness of the arm(s). Onset of symptoms was several months ago. The pain is described as aching and occurs intermittently. The pain is rated moderate, and is located in the neck and radiates to the shoulders. The symptoms have been progressive. Symptoms are exacerbated by extending head backwards, and are relieved by none.  Previous work up includes MRI of cervical spine, results: spinal stenosis.  Past Medical History:  Diagnosis Date  . Adenomatous polyps 11/1998  . Anemia   . Anxiety   . Aortic valve insufficiency    mild  . Breast cancer ([place]) 04/2007   s/p XRT (last 4/09)  . Cervical myelopathy ([place])   . Cervical stenosis of spine    sees neuro surgery and dr.ramons s/p local injection 4/09  . Chronic constipation   . Chronic pancreatitis ([place]) 09/29/2014  . Colon polyps 02/2010   Tubular  Adenomas                                     . Diabetes mellitus   . Difficulty in urination    pt uses cath at times  . Diverticulosis of colon   . Elevated liver function tests    fatty liver per CT 2009  . Fatty liver   . Full body hives    if patient does not take Fexofenadine  . Gastroparesis 10/2015   gastric emptying abnormal for 4 hours  . GERD (gastroesophageal reflux disease)   . Hemorrhoids   . Hyperlipemia   . Hypertension   . Hypothyroidism   . Insomnia   . Lumbar spondylosis    Lumbar epidural steroid injection L5-S1 to the left, Dr. Nelva Bush  . Mitral valve regurgitation   . Osteoarthritis   . Personal history of radiation therapy 2009  . 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 Restless leg syndrome   . Urticaria 06/2009    Past Surgical History:  Procedure Laterality Date  . ANTERIOR CERVICAL DECOMP/DISCECTOMY FUSION N/A 01/08/2013   Procedure: Cervical three-four, four-five, five-six anterior cervical decompression with  fusion plating and bonegraft;  Surgeon: Eustace Moore, MD;  Location: [place] NEURO ORS;  Service: Neurosurgery;  Laterality: N/A;  Cervical three-four, four-five, five-six anterior cervical decompression with fusion plating and bonegraft  . APPENDECTOMY    . BREAST LUMPECTOMY Left 2009   left breast  . CARDIAC CATHETERIZATION  2001  . CATARACT EXTRACTION, BILATERAL  2/11  . EYE SURGERY    . GANGLION CYST EXCISION Right    wrist  . sinus cyst removed    . TRANSCATHETER AORTIC VALVE REPLACEMENT, TRANSAORTIC  01/09/2019  . UTERINE FIBROID SURGERY      Allergies  Allergen Reactions  . Gabapentin Swelling    Edema, legs   . Iodine Hives  . Iohexol   . Nsaids Other (See Comments)    REACTION: hallucinations Tolerates Meloxicam   . Spironolactone     Elevates potassium  . Tolmetin Other (See Comments)    REACTION: hallucinations  . Aciphex [Rabeprazole] Rash  . Ioxaglate Rash and Hives  . Ivp Dye [Iodinated Diagnostic Agents] Hives and Rash  . Nexium [Esomeprazole Magnesium] Rash  . Pepcid [Famotidine] Rash  . Protonix [Pantoprazole Sodium] Rash    Social History   Tobacco Use  .  Smoking status: Never Smoker  . Smokeless tobacco: Never Used  Substance Use Topics  . Alcohol use: Yes    Alcohol/week: 0.0 standard drinks    Comment: rare    Family History  Problem Relation Age of Onset  . Hypertension Mother   . Lung cancer Mother        mets to brain  . Brain cancer Mother   . Tuberculosis Father        died at 22  . Coronary artery disease Neg Hx   . Diabetes Neg Hx   . Stroke Neg Hx   . Colon cancer Neg Hx   . Breast cancer Neg Hx    Prior to Admission medications   Medication Sig Start Date End Date Taking? Authorizing Provider  amLODipine (NORVASC) 5 MG tablet Take 5 mg by mouth daily.   Yes [provider]  aspirin EC 81 MG tablet Take 81 mg by mouth daily.    Yes [provider]  carbidopa-levodopa (SINEMET IR) 25-250 MG tablet Take 1 tablet by  mouth 4 (four) times daily.    Yes [provider]  denosumab (PROLIA) 60 MG/ML SOSY injection Inject 60 mg into the skin every 6 (six) months.    Yes [provider]  insulin aspart (NOVOLOG FLEXPEN) 100 UNIT/ML FlexPen INJECT 5-7 UNITS BEFORE DINNER AND LARGER MEALS Patient taking differently: Inject 5-8 Units into the skin 3 (three) times daily as needed (blood sugar above 180).  02/14/20  Yes Reather Littler, MD  Insulin Pen Needle 31G X 6 MM MISC Use to inject insulin up to 4 times daily. 04/10/19  Yes Reather Littler, MD  levothyroxine (SYNTHROID) 100 MCG tablet Take 1 tablet (100 mcg total) by mouth daily before breakfast. 12/16/19  Yes Reather Littler, MD  lisinopril (ZESTRIL) 40 MG tablet Take 40 mg by mouth daily.  07/02/19  Yes [provider]  meloxicam (MOBIC) 7.5 MG tablet Take 1 tablet (7.5 mg total) by mouth daily as needed for pain. 02/05/20  Yes Paz, Nolon Rod, MD  MILK THISTLE PLUS PO Take 225 mg by mouth in the morning and at bedtime.   Yes [provider]  ondansetron (ZOFRAN) 4 MG tablet Take 1 tablet (4 mg total) by mouth every 8 (eight) hours as needed for nausea or vomiting. 09/11/19  Yes Wanda Plump, MD  rosuvastatin (CRESTOR) 5 MG tablet Take 5 mg by mouth daily.   Yes [provider]  traMADol (ULTRAM) 50 MG tablet Take 100 mg by mouth every 6 (six) hours as needed for moderate pain.  11/28/14  Yes [provider]  triamterene-hydrochlorothiazide (MAXZIDE-25) 37.5-25 MG tablet Take 1 tablet by mouth daily.   Yes [provider]  zolpidem (AMBIEN) 10 MG tablet Take 0.5-1 tablets (5-10 mg total) by mouth at bedtime as needed for sleep. 03/13/19  Yes Wanda Plump, MD  ACCU-CHEK FASTCLIX LANCETS MISC Use to test blood twice daily- Dx code E11.9 02/25/17   Reather Littler, MD  ACCU-CHEK GUIDE test strip USE TO TEST BLOOD SUGAR TWICE DAILY- DX CODE E11.9 10/26/19   Reather Littler, MD  Cyanocobalamin 1000 MCG/ML KIT Inject 1,000 mcg as directed See  admin instructions. Inject IM every month and a half. Patient not taking: Reported on 03/18/2020    [provider]  glucose blood (ACCU-CHEK COMPACT PLUS) test strip CHECK BLOOD SUGARS NO MORE THAN TWICE DAILY DX E11.9 02/07/17   Reather Littler, MD  glucose blood (ACCU-CHEK GUIDE) test  strip Use to test blood sugar twice daily- Dx code E11.9 07/15/18   Elayne Snare, MD  insulin glargine (LANTUS SOLOSTAR) 100 UNIT/ML Solostar Pen INJECT 35 UNITS SUBCUTANEOUSLY ONCE DAILY . Patient taking differently: Inject 35 Units into the skin daily.  10/21/19   Elayne Snare, MD  Lidocaine (ASPERCREME LIDOCAINE) 4 % PTCH Apply topically. Patient not taking: Reported on 03/18/2020    [provider]  metFORMIN (GLUCOPHAGE) 1000 MG tablet Take 1,000 mg by mouth 2 (two) times daily with a meal.     [provider]  Ondansetron 4 MG FILM Take by mouth every 8 (eight) hours as needed. Patient not taking: Reported on 03/18/2020    [provider]     Review of Systems  Positive ROS: neg  All other systems have been reviewed and were otherwise negative with the exception of those mentioned in the HPI and as above.  Objective: Vital signs in last 24 hours: Temp:  [98.1 F (36.7 C)] 98.1 F (36.7 C) (11/05 1052) Pulse Rate:  [83] 83 (11/05 1052) Resp:  [18] 18 (11/05 1052) BP: (151)/(61) 151/61 (11/05 1052) SpO2:  [100 %] 100 % (11/05 1052) Weight:  [69.8 kg] 69.8 kg (11/05 1052)  General Appearance: Alert, cooperative, no distress, appears stated age Head: Normocephalic, without obvious abnormality, atraumatic Eyes: PERRL, conjunctiva/corneas clear, EOM's intact      Neck: Supple, symmetrical, trachea midline, Back: Symmetric, no curvature, ROM normal, no CVA tenderness Lungs:  respirations unlabored Heart: Regular rate and rhythm Abdomen: Soft, non-tender Extremities: Extremities normal, atraumatic, no cyanosis or edema Pulses: 2+ and symmetric all  extremities Skin: Skin color, texture, turgor normal, no rashes or lesions  NEUROLOGIC:  Mental status: Alert and oriented x4, no aphasia, good attention span, fund of knowledge and memory  Motor Exam - grossly normal Sensory Exam - grossly normal Reflexes: 2+ Coordination - grossly normal Gait - grossly normal Balance - grossly normal Cranial Nerves: I: smell Not tested  II: visual acuity  OS: nl    OD: nl  II: visual fields Full to confrontation  II: pupils Equal, round, reactive to light  III,VII: ptosis None  III,IV,VI: extraocular muscles  Full ROM  V: mastication Normal  V: facial light touch sensation  Normal  V,VII: corneal reflex  Present  VII: facial muscle function - upper  Normal  VII: facial muscle function - lower Normal  VIII: hearing Not tested  IX: soft palate elevation  Normal  IX,X: gag reflex Present  XI: trapezius strength  5/5  XI: sternocleidomastoid strength 5/5  XI: neck flexion strength  5/5  XII: tongue strength  Normal    Data Review Lab Results  Component Value Date   WBC 7.8 03/23/2020   HGB 11.1 (L) 03/23/2020   HCT 35.0 (L) 03/23/2020   MCV 94.3 03/23/2020   PLT 130 (L) 03/23/2020   Lab Results  Component Value Date   NA 139 03/23/2020   K 4.2 03/23/2020   CL 104 03/23/2020   CO2 20 (L) 03/23/2020   BUN 21 03/23/2020   CREATININE 0.97 03/23/2020   GLUCOSE 203 (H) 03/23/2020   Lab Results  Component Value Date   INR 1.2 03/23/2020    Assessment:   Cervical neck pain with herniated nucleus pulposus/ spondylosis/ stenosis at C2-3. Estimated body mass index is 26.4 kg/m as calculated from the following:   Height as of this encounter: $RemoveBeforeD'5\' 4"'DBsDidEsytHPbl$  (1.626 m).   Weight as of this encounter: 69.8 kg.  Patient  has failed conservative therapy. Planned surgery : ACDF C2-3, removal of plate C3-4  Plan:   I explained the condition and procedure to the patient and answered any questions.  Patient wishes to proceed with procedure as  planned. Understands risks/ benefits/ and expected or typical outcomes.  Eustace Moore 03/25/2020 12:06 PM

## 2020-03-25 NOTE — Transfer of Care (Signed)
Immediate Anesthesia Transfer of Care Note  Patient: Sharon Armstrong  Procedure(s) Performed: CERVICAL TWO-THREE ANTERIOR CERVICAL DECOMPRESSION/DISCECTOMY FUSION (N/A Spine Cervical)  Patient Location: PACU  Anesthesia Type:General  Level of Consciousness: drowsy, patient cooperative and responds to stimulation  Airway & Oxygen Therapy: Patient Spontanous Breathing and Patient connected to face mask oxygen  Post-op Assessment: Report given to RN, Post -op Vital signs reviewed and stable and Patient moving all extremities X 4  Post vital signs: Reviewed and stable  Last Vitals:  Vitals Value Taken Time  BP 141/60 03/25/20 1530  Temp    Pulse 84 03/25/20 1533  Resp 31 03/25/20 1533  SpO2 100 % 03/25/20 1533  Vitals shown include unvalidated device data.  Last Pain:  Vitals:   03/25/20 1056  TempSrc:   PainSc: 0-No pain         Complications: No complications documented.

## 2020-03-25 NOTE — Op Note (Signed)
03/25/2020  3:21 PM  PATIENT:  Sharon Armstrong  79 y.o. 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 Decompressive anterior cervical discectomy C2-3, 2. Anterior cervical arthrodesis C2-3 utilizing a peek interbody cage packed with locally harvested morcellized autologous bone graft, 3. Anterior cervical plating C2-3 utilizing a NuVasive plate, 4.  Removal of anterior cervical plate C3-C6  SURGEON:  Sherley Bounds, MD  ASSISTANTS: Glenford Peers, FNP  ANESTHESIA:   General  EBL: 25 ml  Total I/O In: 100 [IV Piggyback:100] Out: 25 [Blood:25]  BLOOD ADMINISTERED: none  DRAINS: none  SPECIMEN:  none  INDICATION FOR PROCEDURE: This patient presented with neck pain with numbness in the hands and cramping in the hands. Imaging showed adjacent level stenosis C2-3 with cord compression. The patient tried conservative measures without relief. Pain was debilitating. Recommended ACDF with plating. Patient understood the risks, benefits, and alternatives and potential outcomes and wished to proceed.  PROCEDURE DETAILS: Patient was brought to the operating room placed under general endotracheal anesthesia. Patient was placed in the supine position on the operating room table. The neck was prepped with Duraprep and draped in a sterile fashion.   Three cc of local anesthesia was injected and a transverse incision was made on the right side of the neck.  Dissection was carried down thru the subcutaneous tissue and the platysma was  elevated, opened, and undermined with Metzenbaum scissors.  Dissection was then carried out thru an avascular plane leaving the sternocleidomastoid carotid artery and jugular vein laterally and the trachea and esophagus medially with the assistance of my nurse practitioner. The ventral aspect of the vertebral column was identified and the old  plate was identified.  The soft tissues were removed bluntly from the surface of the plate and the screws were removed from the plate.  The plate was then removed.  The screw holes were dried Surgifoam.  A localizing x-ray was taken. The C2-3 level was identified and all in the room agreed with the level. The longus colli muscles were then elevated and the retractor was placed with the assistance of my nurse practitioner. The annulus was incised and the disc space entered. Discectomy was performed with micro-curettes and pituitary rongeurs. I then used the high-speed drill to drill the endplates down to the level of the posterior longitudinal ligament. The drill shavings were saved in a mucous trap for later arthrodesis. The operating microscope was draped and brought into the field provided additional magnification, illumination and visualization. Discectomy was continued posteriorly thru the disc space. Posterior longitudinal ligament was opened with a nerve hook, and then removed along with disc herniation and osteophytes, decompressing the spinal canal and thecal sac. We then continued to remove osteophytic overgrowth and disc material decompressing the neural foramina and exiting nerve roots bilaterally. The scope was angled up and down to help decompress and undercut the vertebral bodies. Once the decompression was completed we could pass a nerve hook circumferentially to assure adequate decompression in the midline and in the neural foramina. So by both visualization and palpation we felt we had an adequate decompression of the neural elements. We then measured the height of the intravertebral disc space and selected a 7 millimeter peek interbody cage packed with autograft. It was then gently positioned in the intravertebral disc space(s) and countersunk. I then used a NuVasive plate and placed 5 mm variable angle screws into the vertebral  bodies of C2 and C3 and locked them into position. The wound was  irrigated with bacitracin solution, checked for hemostasis which was established and confirmed. Once meticulous hemostasis was achieved, we then proceeded with closure with the assistance of my nurse practitioner. The platysma was closed with interrupted 3-0 undyed Vicryl suture, the subcuticular layer was closed with interrupted 3-0 undyed Vicryl suture. The skin edges were approximated with steristrips. The drapes were removed. A sterile dressing was applied. The patient was then awakened from general anesthesia and transferred to the recovery room in stable condition. At the end of the procedure all sponge, needle and instrument counts were correct.   PLAN OF CARE: Admit for overnight observation  PATIENT DISPOSITION:  PACU - hemodynamically stable.   Delay start of Pharmacological VTE agent (>24hrs) due to surgical blood loss or risk of bleeding:  yes

## 2020-03-25 NOTE — Anesthesia Procedure Notes (Signed)
Procedure Name: Intubation Date/Time: 03/25/2020 1:16 PM Performed by: Kyung Rudd, CRNA Pre-anesthesia Checklist: Patient identified, Emergency Drugs available, Suction available and Patient being monitored Patient Re-evaluated:Patient Re-evaluated prior to induction Oxygen Delivery Method: Circle system utilized Preoxygenation: Pre-oxygenation with 100% oxygen Induction Type: IV induction Ventilation: Mask ventilation without difficulty Laryngoscope Size: Mac and 3 Grade View: Grade III Tube type: Oral Tube size: 7.0 mm Number of attempts: 1 Airway Equipment and Method: Stylet Placement Confirmation: positive ETCO2 and breath sounds checked- equal and bilateral Secured at: 21 cm Tube secured with: Tape Dental Injury: Teeth and Oropharynx as per pre-operative assessment

## 2020-03-26 DIAGNOSIS — M4802 Spinal stenosis, cervical region: Secondary | ICD-10-CM | POA: Diagnosis not present

## 2020-03-26 LAB — GLUCOSE, CAPILLARY: Glucose-Capillary: 174 mg/dL — ABNORMAL HIGH (ref 70–99)

## 2020-03-26 MED ORDER — HYDROCODONE-ACETAMINOPHEN 5-325 MG PO TABS: 1.0000 | ORAL_TABLET | ORAL | 0 refills | Status: DC | PRN

## 2020-03-26 NOTE — Progress Notes (Signed)
Patient is discharged from room 3C04 at this time. Alert and in stable condition. IV site d/c'd and instructions read to patient and spouse with understanding verbalized and all questions answered. Left unit via wheelchair with all belongings at side. 

## 2020-03-26 NOTE — Discharge Summary (Signed)
Physician Discharge Summary  Patient ID: Sharon Armstrong MRN: [id number] DOB/AGE: 1940/11/25 79 y.o.  Admit date: 03/25/2020 Discharge date: 03/26/2020  Admission Diagnoses: Cervical spinal stenosis with myelopathy    Discharge Diagnoses: Same   Discharged Condition: good  Hospital Course: The patient was admitted on 03/25/2020 and taken to the operating room where the patient underwent ACDF C2-3 with removal of plate C3-C6. The patient tolerated the procedure well and was taken to the recovery room and then to the floor in stable condition. The hospital course was routine. There were no complications. The wound remained clean dry and intact. Pt had appropriate neck soreness. No complaints of arm pain or new N/T/W. The patient remained afebrile with stable vital signs, and tolerated a regular diet. The patient continued to increase activities, and pain was well controlled with oral pain medications.   Consults: None  Significant Diagnostic Studies:  Results for orders placed or performed during the hospital encounter of 03/25/20  Glucose, capillary  Result Value Ref Range   Glucose-Capillary 133 (H) 70 - 99 mg/dL  Glucose, capillary  Result Value Ref Range   Glucose-Capillary 88 70 - 99 mg/dL   Comment 1 Notify RN   Glucose, capillary  Result Value Ref Range   Glucose-Capillary 130 (H) 70 - 99 mg/dL   Comment 1 Notify RN    Comment 2 Document in Chart   Glucose, capillary  Result Value Ref Range   Glucose-Capillary 187 (H) 70 - 99 mg/dL   Comment 1 Notify RN    Comment 2 Document in Chart   Glucose, capillary  Result Value Ref Range   Glucose-Capillary 174 (H) 70 - 99 mg/dL   Comment 1 Notify RN    Comment 2 Document in Chart     Chest 2 View  Result Date: 03/24/2020 CLINICAL DATA:  Preop cervical surgery.  History of breast cancer. EXAM: CHEST - 2 VIEW COMPARISON:  01/01/2013 FINDINGS: Heart size and vascularity normal.  Postop TAVR. Lungs are clear without infiltrate  effusion or mass. Left mastectomy and axillary lymph node dissection. Cervical spine fusion hardware anteriorly. IMPRESSION: No active cardiopulmonary disease. Electronically Signed   By: Franchot Gallo M.D.   On: 03/24/2020 16:56   DG Cervical Spine 2-3 Views  Result Date: 03/25/2020 CLINICAL DATA:  Elective surgery. EXAM: CERVICAL SPINE - 2-3 VIEW; DG C-ARM 1-60 MIN COMPARISON:  None. FINDINGS: Single lateral fluoroscopic spot view obtained in the operating room of the cervical spine. Anterior fusion C2-C3 with interbody spacer. Fluoroscopy time 8 seconds. Dose 1.74 mGy. IMPRESSION: Intraoperative fluoroscopy after C2-C3 fusion. Electronically Signed   By: Keith Rake M.D.   On: 03/25/2020 16:12   DG C-Arm 1-60 Min  Result Date: 03/25/2020 CLINICAL DATA:  Elective surgery. EXAM: CERVICAL SPINE - 2-3 VIEW; DG C-ARM 1-60 MIN COMPARISON:  None. FINDINGS: Single lateral fluoroscopic spot view obtained in the operating room of the cervical spine. Anterior fusion C2-C3 with interbody spacer. Fluoroscopy time 8 seconds. Dose 1.74 mGy. IMPRESSION: Intraoperative fluoroscopy after C2-C3 fusion. Electronically Signed   By: Keith Rake M.D.   On: 03/25/2020 16:12    Antibiotics:  Anti-infectives (From admission, onward)   Start     Dose/Rate Route Frequency Ordered Stop   03/25/20 2130  ceFAZolin (ANCEF) IVPB 2g/100 mL premix        2 g 200 mL/hr over 30 Minutes Intravenous Every 8 hours 03/25/20 1638 03/26/20 0508   03/25/20 1200  ceFAZolin (ANCEF) IVPB 2g/100 mL premix  2 g 200 mL/hr over 30 Minutes Intravenous On call to O.R. 03/25/20 1032 03/25/20 1335   03/25/20 1035  ceFAZolin (ANCEF) 2-4 GM/100ML-% IVPB       Note to Pharmacy: Lisette Grinder   : cabinet override      03/25/20 1035 03/25/20 1334      Discharge Exam: Blood pressure (!) 156/60, pulse 82, temperature 98.2 F (36.8 C), temperature source Oral, resp. rate 16, height $RemoveBe'5\' 4"'vIsUNvQIu$  (1.626 m), weight 69.8 kg, SpO2 100  %. Neurologic: Grossly normal Dressing dry  Discharge Medications:   Allergies as of 03/26/2020      Reactions   Gabapentin Swelling   Edema, legs    Iodine Hives   Iohexol    Nsaids Other (See Comments)   REACTION: hallucinations Tolerates Meloxicam    Spironolactone    Elevates potassium   Tolmetin Other (See Comments)   REACTION: hallucinations   Aciphex [rabeprazole] Rash   Ioxaglate Rash, Hives   Ivp Dye [iodinated Diagnostic Agents] Hives, Rash   Nexium [esomeprazole Magnesium] Rash   Pepcid [famotidine] Rash   Protonix [pantoprazole Sodium] Rash      Medication List    TAKE these medications   Accu-Chek FastClix Lancets Misc Use to test blood twice daily- 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   Aspercreme Lidocaine 4 % Ptch Generic drug: Lidocaine Apply topically.   aspirin EC 81 MG tablet Take 81 mg by mouth daily.   carbidopa-levodopa 25-250 MG tablet Commonly known as: SINEMET IR Take 1 tablet by mouth 4 (four) times daily.   Cyanocobalamin 1000 MCG/ML Kit Inject 1,000 mcg as directed See admin instructions. Inject 1038mcg IM every month and a half.   denosumab 60 MG/ML Sosy injection Commonly known as: PROLIA Inject 60 mg into the skin every 6 (six) months.   glucose blood test strip Commonly known as: Accu-Chek Compact Plus CHECK BLOOD SUGARS NO MORE THAN TWICE DAILY DX E11.9   glucose blood test strip Commonly known as: Accu-Chek Guide Use to test blood sugar twice daily- Dx code E11.9   Accu-Chek Guide test strip Generic drug: glucose blood USE TO TEST BLOOD SUGAR TWICE DAILY- DX CODE E11.9   HYDROcodone-acetaminophen 5-325 MG tablet Commonly known as: NORCO/VICODIN Take 1 tablet by mouth every 4 (four) hours as needed for moderate pain ((score 4 to 6)).   Insulin Pen Needle 31G X 6 MM Misc Use to inject insulin up to 4 times daily.   Lantus SoloStar 100 UNIT/ML Solostar  Pen Generic drug: insulin glargine INJECT 35 UNITS SUBCUTANEOUSLY ONCE DAILY . What changed:   how much to take  how to take this  when to take this  additional instructions   levothyroxine 100 MCG tablet Commonly known as: SYNTHROID Take 1 tablet (100 mcg total) by mouth daily before breakfast.   lisinopril 40 MG tablet Commonly known as: ZESTRIL Take 40 mg by mouth daily.   meloxicam 7.5 MG tablet Commonly known as: MOBIC Take 1 tablet (7.5 mg total) by mouth daily as needed for pain.   metFORMIN 1000 MG tablet Commonly known as: GLUCOPHAGE Take 1,000 mg by mouth 2 (two) times daily with a meal.   MILK THISTLE PLUS PO Take 225 mg by mouth in the morning and at bedtime.   NovoLOG FlexPen 100 UNIT/ML FlexPen Generic drug: insulin aspart INJECT 5-7 UNITS BEFORE DINNER AND LARGER MEALS What changed:   how much to take  how to take this  when to take this  reasons to take this  additional instructions   Ondansetron 4 MG Film Take by mouth every 8 (eight) hours as needed.   ondansetron 4 MG tablet Commonly known as: ZOFRAN Take 1 tablet (4 mg total) by mouth every 8 (eight) hours as needed for nausea or vomiting.   rosuvastatin 5 MG tablet Commonly known as: CRESTOR Take 5 mg by mouth daily.   traMADol 50 MG tablet Commonly known as: ULTRAM Take 100 mg by mouth every 6 (six) hours as needed for moderate pain.   triamterene-hydrochlorothiazide 37.5-25 MG tablet Commonly known as: MAXZIDE-25 Take 1 tablet by mouth daily.   zolpidem 10 MG tablet Commonly known as: AMBIEN Take 0.5-1 tablets (5-10 mg total) by mouth at bedtime as needed for sleep.       Disposition: home   Final Dx: ACDF C2-3  Discharge Instructions     Remove dressing in 72 hours   Complete by: As directed    Call MD for:  persistant nausea and vomiting   Complete by: As directed    Call MD for:  redness, tenderness, or signs of infection (pain, swelling, redness, odor or  green/yellow discharge around incision site)   Complete by: As directed    Call MD for:  severe uncontrolled pain   Complete by: As directed    Call MD for:  temperature >100.4   Complete by: As directed    Diet - low sodium heart healthy   Complete by: As directed    Increase activity slowly   Complete by: As directed        Follow-up Information    Eustace Moore, MD. Schedule an appointment as soon as possible for a visit in 2 week(s).   Specialty: Neurosurgery Contact information: [address] [place] [address] [place] [address] [phone]                Signed: Eustace Moore 03/26/2020, 9:05 AM

## 2020-03-26 NOTE — Discharge Instructions (Signed)

## 2020-03-27 NOTE — Anesthesia Postprocedure Evaluation (Signed)
Anesthesia Post Note  Patient: Maurya Micki Riley  Procedure(s) Performed: CERVICAL TWO-THREE ANTERIOR CERVICAL DECOMPRESSION/DISCECTOMY FUSION WITH REMOVAL OF PLATE (N/A Spine Cervical)     Patient location during evaluation: PACU Anesthesia Type: General Level of consciousness: awake and alert Pain management: pain level controlled Vital Signs Assessment: post-procedure vital signs reviewed and stable Respiratory status: spontaneous breathing, nonlabored ventilation, respiratory function stable and patient connected to nasal cannula oxygen Cardiovascular status: blood pressure returned to baseline and stable Postop Assessment: no apparent nausea or vomiting Anesthetic complications: no   No complications documented.  Last Vitals:  Vitals:   03/26/20 0721 03/26/20 0920  BP: (!) 156/60 (!) 130/47  Pulse: 82 84  Resp: 16 20  Temp: 36.8 C   SpO2: 100% 100%    Last Pain:  Vitals:   03/26/20 1025  TempSrc:   PainSc: 5                  Verdean Murin P Anabela Crayton

## 2020-03-28 ENCOUNTER — Telehealth: Payer: Self-pay | Admitting: Internal Medicine

## 2020-03-28 MED ORDER — ZOLPIDEM TARTRATE 5 MG PO TABS: 5.0000 mg | ORAL_TABLET | Freq: Every evening | ORAL | 3 refills | Status: DC | PRN

## 2020-03-28 NOTE — Telephone Encounter (Signed)
Mychart message sent.

## 2020-03-28 NOTE — Telephone Encounter (Signed)
Requesting: Ambien 10mg  Contract: None UDS: None Last Visit: 12/07/19 w/ Etter Sjogren Next Visit: None scheduled Last Refill: 03/13/2019 #30 and 3RF Pt sig: 1/2-1 tab qhs prn  Please Advise

## 2020-03-28 NOTE — Telephone Encounter (Signed)
Advise patient, I sent the prescription but will limit Ambien to 5 mg nightly.

## 2020-03-28 NOTE — Telephone Encounter (Signed)
Patient's husband called inference to patient having neck surgery, per spouse surgery went well however just she needs her ambein renewed , she is having issues getting sleep

## 2020-03-29 ENCOUNTER — Encounter (HOSPITAL_COMMUNITY): Payer: Self-pay | Admitting: Neurological Surgery

## 2020-06-02 ENCOUNTER — Telehealth: Payer: Self-pay | Admitting: Internal Medicine

## 2020-06-02 MED ORDER — ZOLPIDEM TARTRATE 5 MG PO TABS: 5.0000 mg | ORAL_TABLET | Freq: Every evening | ORAL | 1 refills | Status: DC | PRN

## 2020-06-02 NOTE — Telephone Encounter (Signed)
Advise patient, I do not recommend she take 10 mg of zolpidem. So, I can send a 5 mg daily 1 p.o. nightly or 10 mg half tablet nightly

## 2020-06-02 NOTE — Telephone Encounter (Signed)
Spoke w/ Pt- informed of recommendations. Pt verbalized understanding.  

## 2020-06-02 NOTE — Telephone Encounter (Signed)
Patient would like to get the 10 mg instead of 5mg   Medication:  zolpidem (AMBIEN) 5 MG tablet [[id number]]      Has the patient contacted their pharmacy? NO (If no, request that the patient contact the pharmacy for the refill.) (If yes, when and what did the pharmacy advise?)    Preferred Pharmacy (with phone number or street name):  [id number] - [place], [place] - [address] Phone:  [phone]  Fax:  [phone]        Agent: Please be advised that RX refills may take up to 3 business days. We ask that you follow-up with your pharmacy.

## 2020-06-02 NOTE — Telephone Encounter (Signed)
Patient would like a return call she need clarification on Zolpidem   Please advice

## 2020-06-02 NOTE — Telephone Encounter (Signed)
Unable to send the higher dose. Unable to check PDMP. I sent Ambien 5 mg 1 p.o. nightly. Suggest to add OTC melatonin 4 to 8 mg every night.

## 2020-06-02 NOTE — Telephone Encounter (Signed)
Spoke w/ Pt- informed of recommendations. Pt states that 5mg  isn't working for her, she states you can look at the registry and see she doesn't request to much. She is down in [facility] and is having a hard time with her neck since her surgery, she then developed a UTI and had a lip swelling reaction with the abx she was given recently, she also also developed blisters on her back (she will see her primary provider in [facility] for this) but states she is not getting any sleep and is just exhausted.

## 2020-06-02 NOTE — Telephone Encounter (Signed)
Pt requesting to increase back to 10mg . She is in [facility] and will need the prescription to go there.

## 2020-06-13 LAB — HM DIABETES EYE EXAM

## 2020-06-18 ENCOUNTER — Other Ambulatory Visit: Payer: Self-pay | Admitting: Endocrinology

## 2020-07-04 ENCOUNTER — Other Ambulatory Visit: Payer: Self-pay | Admitting: Endocrinology

## 2020-07-15 ENCOUNTER — Other Ambulatory Visit: Payer: Self-pay

## 2020-07-15 ENCOUNTER — Telehealth: Payer: Self-pay

## 2020-07-15 DIAGNOSIS — M818 Other osteoporosis without current pathological fracture: Secondary | ICD-10-CM

## 2020-07-15 NOTE — Telephone Encounter (Signed)
Patient has been called regarding prolia injection. She will owe $0. She states she is in [place] but will be here in April. She has been scheduled for an injection. She also requested bone density to be ordered.

## 2020-07-20 LAB — PROTEIN / CREATININE RATIO, URINE
Albumin, U: 22.6
Creatinine, Urine: 79.2

## 2020-07-21 ENCOUNTER — Other Ambulatory Visit: Payer: Self-pay | Admitting: Internal Medicine

## 2020-07-22 ENCOUNTER — Other Ambulatory Visit: Payer: Self-pay | Admitting: Internal Medicine

## 2020-07-22 ENCOUNTER — Telehealth: Payer: Self-pay | Admitting: Internal Medicine

## 2020-07-22 MED ORDER — ONDANSETRON 4 MG PO FILM
1.0000 | ORAL_FILM | Freq: Three times a day (TID) | ORAL | 1 refills | Status: DC | PRN
Start: 2020-07-22 — End: 2020-12-14

## 2020-07-22 NOTE — Telephone Encounter (Signed)
Rx sent 

## 2020-07-22 NOTE — Telephone Encounter (Signed)
Medication: Ondansetron 4 MG FILM [[id number]]       Has the patient contacted their pharmacy?  (If no, request that the patient contact the pharmacy for the refill.) (If yes, when and what did the pharmacy advise?)     Preferred Pharmacy (with phone number or street name): [id number] - [place], [place] - [address] [place], [place] [address]  Phone:  [phone]      Agent: Please be advised that RX refills may take up to 3 business days. We ask that you follow-up with your pharmacy.

## 2020-08-12 ENCOUNTER — Encounter: Payer: Self-pay | Admitting: Internal Medicine

## 2020-08-30 ENCOUNTER — Ambulatory Visit (INDEPENDENT_AMBULATORY_CARE_PROVIDER_SITE_OTHER): Payer: Medicare Other

## 2020-08-30 ENCOUNTER — Other Ambulatory Visit: Payer: Self-pay

## 2020-08-30 DIAGNOSIS — M810 Age-related osteoporosis without current pathological fracture: Secondary | ICD-10-CM | POA: Diagnosis not present

## 2020-08-30 DIAGNOSIS — M818 Other osteoporosis without current pathological fracture: Secondary | ICD-10-CM | POA: Diagnosis not present

## 2020-08-30 DIAGNOSIS — E538 Deficiency of other specified B group vitamins: Secondary | ICD-10-CM

## 2020-08-30 MED ORDER — CYANOCOBALAMIN 1000 MCG/ML IJ SOLN
1000.0000 ug | Freq: Once | INTRAMUSCULAR | Status: AC
Start: 2020-08-30 — End: 2020-08-30
  Administered 2020-08-30: 1000 ug via INTRAMUSCULAR

## 2020-08-30 MED ORDER — DENOSUMAB 60 MG/ML ~~LOC~~ SOSY
60.0000 mg | PREFILLED_SYRINGE | Freq: Once | SUBCUTANEOUS | Status: AC
  Administered 2020-08-30: 60 mg via SUBCUTANEOUS

## 2020-08-30 NOTE — Progress Notes (Signed)
Pt here for monthly B12 injection per Dr. Larose Kells  B12 103mcg given IM in left deltoid, and pt tolerated injection well  Next B12 injection scheduled for next month.   Patient also received prolia injection. 1 mL given in right arm SQ. Patient will be due 03/02/2021 or later just no earlier.

## 2020-09-05 ENCOUNTER — Telehealth: Payer: Self-pay | Admitting: Internal Medicine

## 2020-09-05 NOTE — Telephone Encounter (Signed)
Pt states that she saw Dr Ronnald Ramp & he stated that he would want her to follow up in hospital for a full work up or paz.  Arm curled up & pain went into leg  Patient would like to be called to know what to do.

## 2020-09-05 NOTE — Telephone Encounter (Signed)
Spoke w/ Pt- she has been having issues w/ R arm and leg off and on for a while, most recent episode was yesterday, back to normal today. Recommended to keep appt for tomorrow w/ PCP.

## 2020-09-05 NOTE — Telephone Encounter (Signed)
I am not sure what is her request, Please arrange a visit.  Last visit 08-2019.

## 2020-09-06 ENCOUNTER — Encounter: Payer: Self-pay | Admitting: Internal Medicine

## 2020-09-06 ENCOUNTER — Ambulatory Visit (INDEPENDENT_AMBULATORY_CARE_PROVIDER_SITE_OTHER): Payer: Medicare Other | Admitting: Internal Medicine

## 2020-09-06 ENCOUNTER — Other Ambulatory Visit: Payer: Self-pay

## 2020-09-06 VITALS — BP 125/77 | HR 83 | Temp 98.2°F | Ht 64.0 in | Wt 149.4 lb

## 2020-09-06 DIAGNOSIS — G2581 Restless legs syndrome: Secondary | ICD-10-CM

## 2020-09-06 DIAGNOSIS — G459 Transient cerebral ischemic attack, unspecified: Secondary | ICD-10-CM | POA: Diagnosis not present

## 2020-09-06 DIAGNOSIS — G629 Polyneuropathy, unspecified: Secondary | ICD-10-CM

## 2020-09-06 DIAGNOSIS — R531 Weakness: Secondary | ICD-10-CM | POA: Diagnosis not present

## 2020-09-06 NOTE — Patient Instructions (Signed)
See Dr. Ronnald Ramp as a scheduled  We are referring you to a neurologist for neuropathy and RLS  See your diabetes doctor for blood sugar control  We are working on get a  MRI of the brain    [place], [place] back for a routine checkup at your convenience

## 2020-09-06 NOTE — Progress Notes (Signed)
Subjective:    Patient ID: Sharon Armstrong, female    DOB: [birth year], 80 y.o.   MRN: [id number]  DOS:  09/06/2020 Type of visit - description: Acute visit, to assess neurological symptoms  The patient reports since the neck surgery November 2021 she is experiencing either left or right arm cramps and  pain. Cramps described as a hyperflexion of her upper extremity muscles, they last approximately 10 or 15 minutes and happening 2-3 times a week.  She called the answering service  because at the time in addition to the right arm cramps she experiences similar symptoms on the right leg.  The symptoms are not associated with headache, dizziness, diplopia, slurred speech or motor deficit.  In addition, she reports difficulty swallowing.  Saw ENT in [place]  In addition reports chronic neuropathy and RLS that are currently not well controlled.   Review of Systems Denies chest pain, difficulty breathing or palpitations No saddle anesthesia No bladder or bowel incontinence  Past Medical History:  Diagnosis Date  . Adenomatous polyps 11/1998  . Anemia   . Anxiety   . Aortic valve insufficiency    mild  . Breast cancer ([place]) 04/2007   s/p XRT (last 4/09)  . Cervical myelopathy ([place])   . Cervical stenosis of spine    sees neuro surgery and dr.ramons s/p local injection 4/09  . Chronic constipation   . Chronic pancreatitis ([place]) 09/29/2014  . Colon polyps 02/2010   Tubular  Adenomas                                     . Diabetes mellitus   . Difficulty in urination    pt uses cath at times  . Diverticulosis of colon   . Elevated liver function tests    fatty liver per CT 2009  . Fatty liver   . Full body hives    if patient does not take Fexofenadine  . Gastroparesis 10/2015   gastric emptying abnormal for 4 hours  . GERD (gastroesophageal reflux disease)   . Hemorrhoids   . Hyperlipemia   . Hypertension   . Hypothyroidism   . Insomnia   . Lumbar spondylosis    Lumbar  epidural steroid injection L5-S1 to the left, Dr. Nelva Bush  . Mitral valve regurgitation   . Osteoarthritis   . Personal history of radiation therapy 2009  . 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 Restless leg syndrome   . Urticaria 06/2009    Past Surgical History:  Procedure Laterality Date  . ANTERIOR CERVICAL DECOMP/DISCECTOMY FUSION N/A 01/08/2013   Procedure: Cervical three-four, four-five, five-six anterior cervical decompression with fusion plating and bonegraft;  Surgeon: Eustace Moore, MD;  Location: [facility];  Service: Neurosurgery;  Laterality: N/A;  Cervical three-four, four-five, five-six anterior cervical decompression with fusion plating and bonegraft  . ANTERIOR CERVICAL DECOMP/DISCECTOMY FUSION N/A 03/25/2020   Procedure: CERVICAL TWO-THREE ANTERIOR CERVICAL DECOMPRESSION/DISCECTOMY FUSION WITH REMOVAL OF PLATE;  Surgeon: Eustace Moore, MD;  Location: [facility];  Service: Neurosurgery;  Laterality: N/A;  3C  . APPENDECTOMY    . BREAST LUMPECTOMY Left 2009   left breast  . CARDIAC CATHETERIZATION  2001  . CATARACT EXTRACTION, BILATERAL  2/11  . EYE SURGERY    . GANGLION CYST EXCISION Right    wrist  . sinus cyst removed    . TRANSCATHETER  AORTIC VALVE REPLACEMENT, TRANSAORTIC  01/09/2019  . UTERINE FIBROID SURGERY      Allergies as of 09/06/2020      Reactions   Ciprofloxacin Hives, Swelling   Gabapentin Swelling   Edema, legs    Iodine Hives   Iohexol    Nsaids Other (See Comments)   REACTION: hallucinations Tolerates Meloxicam    Spironolactone    Elevates potassium   Tolmetin Other (See Comments)   REACTION: hallucinations   Aciphex [rabeprazole] Rash   Ioxaglate Rash, Hives   Ivp Dye [iodinated Diagnostic Agents] Hives, Rash   Nexium [esomeprazole Magnesium] Rash   Pepcid [famotidine] Rash   Protonix [pantoprazole Sodium] Rash      Medication List       Accurate as of September 06, 2020 11:59 PM. If you have any questions, ask your nurse or  doctor.        Accu-Chek FastClix Lancets Misc Use to test blood twice daily- 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   aspirin EC 81 MG tablet Take 81 mg by mouth daily.   carbidopa-levodopa 25-250 MG tablet Commonly known as: SINEMET IR Take 1 tablet by mouth 4 (four) times daily.   Cyanocobalamin 1000 MCG/ML Kit Inject 1,000 mcg as directed See admin instructions. Inject 1071mg IM every month and a half.   denosumab 60 MG/ML Sosy injection Commonly known as: PROLIA Inject 60 mg into the skin every 6 (six) months.   glucose blood test strip Commonly known as: Accu-Chek Compact Plus CHECK BLOOD SUGARS NO MORE THAN TWICE DAILY DX E11.9   glucose blood test strip Commonly known as: Accu-Chek Guide Use to test blood sugar twice daily- Dx code E11.9   Accu-Chek Guide test strip Generic drug: glucose blood USE TO TEST BLOOD SUGAR TWICE DAILY- DX CODE E11.9   HYDROcodone-acetaminophen 5-325 MG tablet Commonly known as: NORCO/VICODIN Take 1 tablet by mouth every 4 (four) hours as needed for moderate pain ((score 4 to 6)).   Insulin Pen Needle 31G X 6 MM Misc Use to inject insulin up to 4 times daily.   Lantus SoloStar 100 UNIT/ML Solostar Pen Generic drug: insulin glargine INJECT 35 UNITS SUBCUTANEOUSLY ONCE DAILY   levothyroxine 100 MCG tablet Commonly known as: SYNTHROID 1 tablet before breakfast, please make follow-up appointment   Lidocaine 4 % Ptch Apply topically.   lisinopril 40 MG tablet Commonly known as: ZESTRIL Take 40 mg by mouth daily.   meloxicam 7.5 MG tablet Commonly known as: MOBIC Take 1 tablet (7.5 mg total) by mouth daily as needed for pain.   metFORMIN 1000 MG tablet Commonly known as: GLUCOPHAGE Take 1,000 mg by mouth 2 (two) times daily with a meal.   MILK THISTLE PLUS PO Take 225 mg by mouth in the morning and at bedtime.   NovoLOG FlexPen 100 UNIT/ML FlexPen Generic drug:  insulin aspart INJECT 5-7 UNITS BEFORE DINNER AND LARGER MEALS What changed:   how much to take  how to take this  when to take this  reasons to take this  additional instructions   Ondansetron 4 MG Film Take 1 each by mouth every 8 (eight) hours as needed.   rosuvastatin 5 MG tablet Commonly known as: CRESTOR Take 5 mg by mouth daily.   traMADol 50 MG tablet Commonly known as: ULTRAM Take 100 mg by mouth every 6 (six) hours as needed for moderate pain.   triamterene-hydrochlorothiazide 37.5-25 MG tablet  Commonly known as: MAXZIDE-25 Take 1 tablet by mouth daily.   zolpidem 5 MG tablet Commonly known as: AMBIEN Take 1 tablet (5 mg total) by mouth at bedtime as needed for sleep.          Objective:   Physical Exam BP 125/77 (BP Location: Right Arm, Patient Position: Sitting, Cuff Size: Large)   Pulse 83   Temp 98.2 F (36.8 C) (Temporal)   Ht _0  (1.626 m)   Wt 149 lb 6.4 oz (67.8 kg)   SpO2 98%   BMI 25.64 kg/m  General:   Well developed, NAD, BMI noted. HEENT:  Normocephalic . Face symmetric, atraumatic Neck: Normal carotid pulses Lungs:  CTA B Normal respiratory effort, no intercostal retractions, no accessory muscle use. Heart: RRR,  no murmur.  Lower extremities: no pretibial edema bilaterally  Skin: Not pale. Not jaundice Neurologic:  alert & oriented X3.  Speech normal, gait appropriate for age and unassisted EOMI, pupils equal and reactive, face symmetric.  Tongue midline Motor symmetric, DTR symmetric.  Gait: Not dysplastic. Psych--  Cognition and judgment appear intact.  Cooperative with normal attention span and concentration.  Behavior appropriate. No anxious or depressed appearing.      Assessment     Assessment DM -- used to see Dr Dwyane Dee HTN Hyperlipidemia- not on meds  Hypothyroidism Anxiety , insomnia CV: - S/p Tvar 12/2018 @ [facility] - Carotid artery Dz dx in [place] Chronic fatigue (rx empiric B12 shot 12-2014) RLS   MSK: on ultram per pain mngmt --Spinal cervical stenosis, local injections, surgery 12-2012 --Low back pain-- Dr Nelva Bush --DJD  --osteoporosis see phone note 01/2016  2010: T score -1.5 (femur)  2015: T score -1.9 (femur) 02/08/2016: T score -1.6 (femur)  04/09/2018: T score -1.3. Started Prolia approximately 05-2011 GI: used to see Dr Fuller Plan, last OV 10-2014 , now Dr Roney Mans  --GERD, --Chronic pancreatitis, h/o episodes of acute pancreatitis --h/o fatty liver and ? Of early cirrhosis by MRI --Chronic constipation --Nausea, vomiting, eval in [place]~ 12- 2016: Delayed gastric emptying, EGD biopsy no H. Pylori,SIBO +, Rx ABX Breast cancer dx 2008 XRT H/o +PPD H/o Urticaria H/ o Mild aortic valve insufficiency   PLAN: Acute visit, seen for neurological symptoms TIA? The patient call with neurological symptoms as described above, she has episodes of right or left arm cramps but 2 days ago had an episode that involve the whole right side (arm and leg). Neurological exam is nonfocal.  No clear-cut stroke symptoms associated with above. She does have multiple risk factors for cardiovascular events. Symptoms started after neck surgery Plan: Brain MRI, follow-up with neurosurgery Dr. Ronnald Ramp as scheduled.Marland Kitchen Dysphagia: Started after neck surgery, saw a ENT in [place], no treatment recommended, symptoms unchanged, recommend to discuss with Dr. Ronnald Ramp RLS, neuropathy: sees a neurologist in [place], they have been unable to help her, request further assistance, refer to neurology here in [facility] for a routine visit at her earliest convenience  Time spent 30 minutes reviewing the chart, evaluating for new symptoms: Question of TIA, ordering an MRI.  This visit occurred during the SARS-CoV-2 public health emergency.  Safety protocols were in place, including screening questions prior to the visit, additional usage of staff PPE, and extensive cleaning of exam room while observing appropriate contact  time as indicated for disinfecting solutions.

## 2020-09-07 NOTE — Assessment & Plan Note (Signed)
TIA? The patient call with neurological symptoms as described above, she has episodes of right or left arm cramps but 2 days ago had an episode that involve the whole right side (arm and leg). Neurological exam is nonfocal.  No clear-cut stroke symptoms associated with above. She does have multiple risk factors for cardiovascular events. Symptoms started after neck surgery Plan: Brain MRI, follow-up with neurosurgery Dr. Ronnald Ramp as scheduled.Marland Kitchen Dysphagia: Started after neck surgery, saw a ENT in [place], no treatment recommended, symptoms unchanged, recommend to discuss with Dr. Ronnald Ramp RLS, neuropathy: sees a neurologist in [place], they have been unable to help her, request further assistance, refer to neurology here in [facility] for a routine visit at her earliest convenience

## 2020-09-09 ENCOUNTER — Other Ambulatory Visit: Payer: Self-pay | Admitting: Neurological Surgery

## 2020-09-09 DIAGNOSIS — R1319 Other dysphagia: Secondary | ICD-10-CM

## 2020-09-13 ENCOUNTER — Ambulatory Visit
Admission: RE | Admit: 2020-09-13 | Discharge: 2020-09-13 | Disposition: A | Discharge status: Home / Self Care | Payer: Medicare Other | Source: Ambulatory Visit | Attending: Neurological Surgery | Admitting: Neurological Surgery

## 2020-09-13 DIAGNOSIS — R1319 Other dysphagia: Secondary | ICD-10-CM

## 2020-09-17 ENCOUNTER — Ambulatory Visit (HOSPITAL_BASED_OUTPATIENT_CLINIC_OR_DEPARTMENT_OTHER)
Admission: RE | Admit: 2020-09-17 | Discharge: 2020-09-17 | Disposition: A | Discharge status: Home / Self Care | Payer: Medicare Other | Source: Ambulatory Visit | Attending: Internal Medicine | Admitting: Internal Medicine

## 2020-09-17 ENCOUNTER — Other Ambulatory Visit: Payer: Self-pay

## 2020-09-17 DIAGNOSIS — R531 Weakness: Secondary | ICD-10-CM | POA: Diagnosis present

## 2020-09-29 ENCOUNTER — Ambulatory Visit (INDEPENDENT_AMBULATORY_CARE_PROVIDER_SITE_OTHER): Payer: Medicare Other

## 2020-09-29 ENCOUNTER — Other Ambulatory Visit: Payer: Self-pay

## 2020-09-29 DIAGNOSIS — E538 Deficiency of other specified B group vitamins: Secondary | ICD-10-CM

## 2020-09-29 MED ORDER — CYANOCOBALAMIN 1000 MCG/ML IJ SOLN
1000.0000 ug | Freq: Once | INTRAMUSCULAR | Status: AC
  Administered 2020-09-29: 1000 ug via INTRAMUSCULAR

## 2020-09-29 NOTE — Progress Notes (Signed)
Pt here for monthly B12 injection per Dr Larose Kells  B12 1047mcg given IM on right deltoid, and pt tolerated injection well.  Next B12 injection scheduled for 11/01/2020 and scheduled. -JMA

## 2020-10-12 ENCOUNTER — Other Ambulatory Visit: Payer: Self-pay | Admitting: Endocrinology

## 2020-10-18 ENCOUNTER — Telehealth: Payer: Self-pay | Admitting: Internal Medicine

## 2020-10-18 DIAGNOSIS — G2581 Restless legs syndrome: Secondary | ICD-10-CM

## 2020-10-18 DIAGNOSIS — R531 Weakness: Secondary | ICD-10-CM

## 2020-10-18 NOTE — Telephone Encounter (Signed)
The patient's husband states that an appointment with Caryn Section has been scheduled for 11/28/20. states her cardiology and neck surgeon would like the appointment to be sooner. Please advise

## 2020-10-18 NOTE — Telephone Encounter (Signed)
Will send referral to [facility] to see if they are able to see her sooner.

## 2020-11-01 ENCOUNTER — Ambulatory Visit (INDEPENDENT_AMBULATORY_CARE_PROVIDER_SITE_OTHER): Payer: Medicare Other

## 2020-11-01 ENCOUNTER — Other Ambulatory Visit: Payer: Self-pay

## 2020-11-01 DIAGNOSIS — E538 Deficiency of other specified B group vitamins: Secondary | ICD-10-CM

## 2020-11-01 MED ORDER — CYANOCOBALAMIN 1000 MCG/ML IJ SOLN
1000.0000 ug | Freq: Once | INTRAMUSCULAR | Status: AC
  Administered 2020-11-01: 1000 ug via INTRAMUSCULAR

## 2020-11-01 NOTE — Progress Notes (Signed)
Sharon Armstrong is a 80 y.o. female presents to the office today for monthly b12 injection  Cyanocobalamin (med), 1,000 mcg (dose),  IM (route) was administered left deltoid (location) today. Patient tolerated injection. Patient next injection due: one month, appt made Yes  Sharon Armstrong

## 2020-11-02 ENCOUNTER — Encounter: Payer: Self-pay | Admitting: Neurology

## 2020-11-02 ENCOUNTER — Ambulatory Visit (INDEPENDENT_AMBULATORY_CARE_PROVIDER_SITE_OTHER): Payer: Medicare Other | Admitting: Neurology

## 2020-11-02 ENCOUNTER — Encounter: Payer: Self-pay | Admitting: Internal Medicine

## 2020-11-02 VITALS — BP 139/70 | HR 69 | Ht 64.0 in | Wt 147.3 lb

## 2020-11-02 DIAGNOSIS — D508 Other iron deficiency anemias: Secondary | ICD-10-CM | POA: Diagnosis not present

## 2020-11-02 DIAGNOSIS — E538 Deficiency of other specified B group vitamins: Secondary | ICD-10-CM | POA: Diagnosis not present

## 2020-11-02 DIAGNOSIS — G2581 Restless legs syndrome: Secondary | ICD-10-CM

## 2020-11-02 DIAGNOSIS — Z8669 Personal history of other diseases of the nervous system and sense organs: Secondary | ICD-10-CM | POA: Diagnosis not present

## 2020-11-02 NOTE — Patient Instructions (Addendum)
It was nice to meet you today.  We will check blood work today and call you with the test results. We will do an EMG and nerve conduction velocity test, which is an electrical nerve and muscle test, which we will schedule. We will call you with the results. As discussed, we will request prior records from your neurologist as you have seen a neurologist for many years in [place].  I do not recommend levodopa for restless leg syndrome and you are on a fairly high dose.  I would like for you to discuss with your neurologist in [place] the possibility of tapering off the levodopa.  Once you are off the levodopa we can consider other medications so long as you have not tried alternative medications, prior records will help determine what else we could try for your restless legs.

## 2020-11-02 NOTE — Progress Notes (Signed)
Subjective:    Patient ID: Sharon Armstrong is a 80 y.o. female.  HPI    Star Age, MD, PhD [facility] [address], [address] [place], [place] [address]  Dear Dr. Larose Kells,   I saw your patient, Sharon Armstrong, upon your kind request in my neurologic clinic today for initial consultation of her restless leg syndrome and neuropathy concerns.  The patient is accompanied by her husband today. As you know, Ms. Cendejas is a 80 year old right-handed woman with an underlying complex medical history of anemia, aortic valve insufficiency, history of breast cancer, cervical myelopathy with history of cervical stenosis, s/p surgery x 2, diabetes, diverticulosis, elevated liver enzymes, fatty liver, gastroparesis, reflux disease, lumbar spondylosis, hypertension, hyperlipidemia, hypothyroidism, insomnia, osteoarthritis, positive PPD, urticaria, mitral valve regurgitation and restless leg syndrome, who reports a longstanding history of restless leg symptoms of over 20 years as well as a diagnosis of neuropathy affecting her feet of over 20 years.  She follows with a neurologist out of [place].  She goes to [place] yearly.  She typically sees her neurologist at least once a year or one of his assistance.  She has been on levodopa therapy for restless leg syndrome for years.  She does not recall trying a dopamine agonist such as Requip or Mirapex.  I do not have any neurology records available for review.  She was diagnosed many years ago with diabetic neuropathy and reports numbness affecting her feet and sometimes her legs bilaterally.  She has intermittent tingling in both hands but reports a history of neck pain and myelopathy as well as having had surgeries.  Her neurosurgeon is Dr. Sherley Bounds.  She has been seeing Dr. Nelva Bush for pain management and is currently on Ultram.  She does not take any hydrocodone any longer.  I reviewed your office note from 09/06/2020.  She  reported a diagnosis of chronic neuropathy and restless leg syndrome.  She also reported that since her neck surgery in November 2021 she has had intermittent cramping in her right and left arm as well as pain.  She has been followed by neurosurgeon, Dr. Sherley Bounds.   You ordered a brain MRI.  She had a brain MRI without contrast on 09/17/2020 and I reviewed the results: IMPRESSION: No acute or subacute finding. Minimal small vessel change of the hemispheric white matter, less than often seen at this age.  Her A1c on 03/03/2020 was 6.9.  6 months prior to that it was 7.3.  She is currently on multiple medications, including Ambien 5 mg strength as needed for sleep, she takes tramadol as needed which helps her restless legs as well.  She takes levodopa 25-250 mg strength 1 pill 4 times a day at this time.  She reports that her symptoms are primarily at night when she has trouble sleeping and she has twitching in her legs and an urge to move her legs and it helps to get up and walk around.    Of note, she has several allergies or intolerances to medications.  Has an intolerance to gabapentin or allergy, had swelling from it.  She does not recall how long ago she took it and what she took it for.   Her Past Medical History Is Significant For: Past Medical History:  Diagnosis Date   Adenomatous polyps 11/1998   Anemia    Anxiety    Aortic valve insufficiency    mild   Breast cancer ([place]) 04/2007   s/p  XRT (last 4/09)   Cervical myelopathy ([facility])    Cervical stenosis of spine    sees neuro surgery and dr.ramons s/p local injection 4/09   Chronic constipation    Chronic pancreatitis ([facility]) 09/29/2014   Colon polyps 02/2010   Tubular  Adenomas                                      Diabetes mellitus    Difficulty in urination    pt uses cath at times   Diverticulosis of colon    Elevated liver function tests    fatty liver per CT 2009   Fatty liver    Full body hives    if patient does not take  Fexofenadine   Gastroparesis 10/2015   gastric emptying abnormal for 4 hours   GERD (gastroesophageal reflux disease)    Hemorrhoids    Hyperlipemia    Hypertension    Hypothyroidism    Insomnia    Lumbar spondylosis    Lumbar epidural steroid injection L5-S1 to the left, Dr. Nelva Bush   Mitral valve regurgitation    Osteoarthritis    Personal history of radiation therapy 2009   Positive PPD    never treated father died TB   Restless leg syndrome    Urticaria 06/2009    Her Past Surgical History Is Significant For: Past Surgical History:  Procedure Laterality Date   ANTERIOR CERVICAL DECOMP/DISCECTOMY FUSION N/A 01/08/2013   Procedure: Cervical three-four, four-five, five-six anterior cervical decompression with fusion plating and bonegraft;  Surgeon: Eustace Moore, MD;  Location: [facility];  Service: Neurosurgery;  Laterality: N/A;  Cervical three-four, four-five, five-six anterior cervical decompression with fusion plating and bonegraft   ANTERIOR CERVICAL DECOMP/DISCECTOMY FUSION N/A 03/25/2020   Procedure: CERVICAL TWO-THREE ANTERIOR CERVICAL DECOMPRESSION/DISCECTOMY FUSION WITH REMOVAL OF PLATE;  Surgeon: Eustace Moore, MD;  Location: [facility];  Service: Neurosurgery;  Laterality: N/A;  3C   APPENDECTOMY     BREAST LUMPECTOMY Left 2009   left breast   CARDIAC CATHETERIZATION  2001   CATARACT EXTRACTION, BILATERAL  2/11   EYE SURGERY     GANGLION CYST EXCISION Right    wrist   sinus cyst removed     TRANSCATHETER AORTIC VALVE REPLACEMENT, TRANSAORTIC  01/09/2019   UTERINE FIBROID SURGERY      Her Family History Is Significant For: Family History  Problem Relation Age of Onset   Hypertension Mother    Lung cancer Mother        mets to brain   Brain cancer Mother    Tuberculosis Father        died at 57   Coronary artery disease Neg Hx    Diabetes Neg Hx    Stroke Neg Hx    Colon cancer Neg Hx    Breast cancer Neg Hx     Her Social History Is Significant  For: Social History   Socioeconomic History   Marital status: Married    Spouse name: Not on file   Number of children: 2   Years of education: Not on file   Highest education level: Not on file  Occupational History   Occupation: Advertising account executive  Tobacco Use   Smoking status: Never   Smokeless tobacco: Never  Vaping Use   Vaping Use: Never used  Substance and Sexual Activity   Alcohol use: Yes    Alcohol/week: 0.0  standard drinks    Comment: rare   Drug use: No   Sexual activity: Not on file  Other Topics Concern   Not on file  Social History Narrative   Lives in [place] half of the time       Social Determinants of Health   Financial Resource Strain: Not on file  Food Insecurity: Not on file  Transportation Needs: Not on file  Physical Activity: Not on file  Stress: Not on file  Social Connections: Not on file    Her Allergies Are:  Allergies  Allergen Reactions   Ciprofloxacin Hives and Swelling   Gabapentin Swelling    Edema, legs    Iodine Hives   Iohexol    Nsaids Other (See Comments)    REACTION: hallucinations Tolerates Meloxicam    Spironolactone     Elevates potassium   Tolmetin Other (See Comments)    REACTION: hallucinations   Aciphex [Rabeprazole] Rash   Ioxaglate Rash and Hives   Ivp Dye [Iodinated Diagnostic Agents] Hives and Rash   Nexium [Esomeprazole Magnesium] Rash   Pepcid [Famotidine] Rash   Protonix [Pantoprazole Sodium] Rash  :   Her Current Medications Are:  Outpatient Encounter Medications as of 11/02/2020  Medication Sig   ACCU-CHEK FASTCLIX LANCETS MISC Use to test blood twice daily- Dx code E11.9   ACCU-CHEK GUIDE test strip USE TO TEST BLOOD SUGAR TWICE DAILY- DX CODE E11.9   amLODipine (NORVASC) 5 MG tablet Take 5 mg by mouth daily.   aspirin EC 81 MG tablet Take 81 mg by mouth daily.    carbidopa-levodopa (SINEMET IR) 25-250 MG tablet Take 1 tablet by mouth 4 (four) times daily.    Cyanocobalamin 1000 MCG/ML KIT  Inject 1,000 mcg as directed See admin instructions. Inject 1058mcg IM every month and a half.   denosumab (PROLIA) 60 MG/ML SOSY injection Inject 60 mg into the skin every 6 (six) months.    glucose blood (ACCU-CHEK COMPACT PLUS) test strip CHECK BLOOD SUGARS NO MORE THAN TWICE DAILY DX E11.9   glucose blood (ACCU-CHEK GUIDE) test strip Use to test blood sugar twice daily- Dx code E11.9   HYDROcodone-acetaminophen (NORCO/VICODIN) 5-325 MG tablet Take 1 tablet by mouth every 4 (four) hours as needed for moderate pain ((score 4 to 6)).   insulin aspart (NOVOLOG FLEXPEN) 100 UNIT/ML FlexPen INJECT 5-7 UNITS BEFORE DINNER AND LARGER MEALS (Patient taking differently: Inject 5-8 Units into the skin 3 (three) times daily as needed (blood sugar above 180).)   Insulin Pen Needle 31G X 6 MM MISC Use to inject insulin up to 4 times daily.   LANTUS SOLOSTAR 100 UNIT/ML Solostar Pen INJECT 35 UNITS SUBCUTANEOUSLY ONCE DAILY   levothyroxine (SYNTHROID) 100 MCG tablet 1 tablet before breakfast, please make follow-up appointment   Lidocaine 4 % PTCH Apply topically.   lisinopril (ZESTRIL) 40 MG tablet Take 40 mg by mouth daily.    meloxicam (MOBIC) 7.5 MG tablet Take 1 tablet (7.5 mg total) by mouth daily as needed for pain.   metFORMIN (GLUCOPHAGE) 1000 MG tablet Take 1,000 mg by mouth 2 (two) times daily with a meal.    MILK THISTLE PLUS PO Take 225 mg by mouth in the morning and at bedtime.   Ondansetron 4 MG FILM Take 1 each by mouth every 8 (eight) hours as needed.   rosuvastatin (CRESTOR) 5 MG tablet Take 5 mg by mouth daily.   traMADol (ULTRAM) 50 MG tablet Take 100 mg by mouth every 6 (  six) hours as needed for moderate pain.   triamterene-hydrochlorothiazide (MAXZIDE-25) 37.5-25 MG tablet Take 1 tablet by mouth daily.   zolpidem (AMBIEN) 5 MG tablet Take 1 tablet (5 mg total) by mouth at bedtime as needed for sleep.   No facility-administered encounter medications on file as of 11/02/2020.   :   Review of Systems:  Out of a complete 14 point review of systems, all are reviewed and negative with the exception of these symptoms as listed below:  Review of Systems  Neurological:        Here for f/u on RLS. Reports sx have been present for a few years now.   Objective:  Neurological Exam  Physical Exam Physical Examination:   Vitals:   11/02/20 1033  BP: 139/70  Pulse: 69    General Examination: The patient is a very pleasant 80 y.o. female in no acute distress. She appears well-developed and well-nourished and well groomed.   HEENT: Normocephalic, atraumatic, pupils are equal, round and reactive to light, s/p bilateral cataract repairs.  Extraocular tracking is well-preserved in all directions, no nystagmus.  Hearing is grossly intact, face is symmetric with normal facial animation and normal facial sensation, no facial masking noted.  Neck with decreased range of motion, no carotid bruits.  Airway examination reveals mild to moderate mouth dryness, tongue protrudes centrally and palate elevates symmetrically.    Chest: Clear to auscultation without wheezing, rhonchi or crackles noted.  Heart: S1+S2+0, regular and normal without murmurs, rubs or gallops noted.   Abdomen: Soft, non-tender and non-distended with normal bowel sounds appreciated on auscultation.  Extremities: There is no pitting edema in the distal lower extremities bilaterally. Pedal pulses are intact.  Skin: Warm and dry without trophic changes noted. There are no varicose veins.  Musculoskeletal: exam reveals no obvious joint deformities, tenderness or joint swelling or erythema.   Neurologically:  Mental status: The patient is awake, alert and oriented in all 4 spheres. Her immediate and remote memory, attention, language skills and fund of knowledge are fairly appropriate but she does have difficulty relating her history and does not remember details, especially chronological details. Speech is  clear with normal prosody and enunciation. Thought process is linear. Mood is normal and affect is normal.  Cranial nerves II - XII are as described above under HEENT exam. In addition: shoulder shrug is normal with equal shoulder height noted. Motor exam: Normal bulk, strength and tone is noted. There is no drift, tremor or rebound. Romberg is negative, with the exception of mild sway. Reflexes are 1-2+ throughout. Babinski: Toes are flexor bilaterally. Fine motor skills and coordination: intact with normal finger taps, normal hand movements, normal rapid alternating patting, normal foot taps and normal foot agility.  Cerebellar testing: No dysmetria or intention tremor on finger to nose testing. Heel to shin is unremarkable bilaterally. There is no truncal or gait ataxia.  Sensory exam: intact to light touch, vibration and temperature sense in the upper extremities with mild decrease to temperature and vibration sense in the feet, particularly big toes and forefoot areas.  Gait, station and balance: She stands without difficulty, she walks with mild insecurity, slowly, no shuffling, preserved arm swing.  Balance is mildly impaired.                Assessment and Plan:   In summary, Lielle Vandervort Sloan is a very pleasant 80 y.o.-year old female with an underlying complex medical history of anemia, aortic valve insufficiency, history of breast cancer,  cervical myelopathy with history of cervical stenosis, s/p surgery x 2, diabetes, diverticulosis, elevated liver enzymes, fatty liver, gastroparesis, reflux disease, lumbar spondylosis, hypertension, hyperlipidemia, hypothyroidism, insomnia, osteoarthritis, positive PPD, urticaria, mitral valve regurgitation and restless leg syndrome, who presents for evaluation of her longstanding history of neuropathy and restless leg syndrome.  She sees a neurologist out of [place] at least once a year, she plans a follow-up in October.  She has been on levodopa therapy, fairly  high dose at this time with 250 mg 4 times a day and has been on it for years for restless leg syndrome.  She does not recall trying a dopamine agonist, does not have details of her work-up and therapy though.  She is willing to sign a release of information and we can try to get records from her neurologist in [place].  He is advised that I do not recommend levodopa especially in this higher dose for restless leg syndrome.  I would not be comfortable prescribing this for her and she can talk to her prescribing neurologist about maintaining treatment through him as she does go to [place] on at least a yearly basis.  We can also consider having her taper off this medication, she will bring this up at the next appointment which is planned for October with him.  In the meantime, she is willing to sign a release of information so we can update our records as to previous testing and previous medications tried.  For neuropathy, she does not recall having electrophysiological testing done.  She is willing to proceed with an EMG nerve conduction velocity test.  Blood work from 03/03/2020 showed an A1c of 6.9, TSH was normal at 2.5.  She has a history of anemia and iron deficiency as well as B12 deficiency.  We will do some additional blood work today.  We will proceed with EMG nerve conduction velocity testing and I will await records from her neurologist in [place].  She is advised that iron deficiency and anemia can contribute to restless leg symptoms.  We will call her with her blood test results and plan a follow-up in this clinic after she has had a chance to talk with her neurologist in [place].  Her neurological exam is otherwise benign.  She does have some reduction in neck mobility, likely owing to her 2 surgeries.  Of note, her recent brain MRI from April 2022 was benign.  I answered all the questions today and the patient and her husband were in agreement with the plan. Thank you very much for allowing me to  participate in the care of this nice patient. If I can be of any further assistance to you please do not hesitate to call me at [phone].  Sincerely,   Star Age, MD, PhD

## 2020-11-03 ENCOUNTER — Telehealth: Payer: Self-pay

## 2020-11-03 LAB — B12 AND FOLATE PANEL
Folate: 14.6 ng/mL (ref >3.0)
Vitamin B-12: >2000 pg/mL — ABNORMAL HIGH (ref 232–1245)

## 2020-11-03 LAB — IRON AND TIBC
Iron Saturation: 10 % — ABNORMAL LOW (ref 15–55)
Iron: 46 ug/dL (ref 27–139)
Total Iron Binding Capacity: 445 ug/dL (ref 250–450)
UIBC: 399 ug/dL — ABNORMAL HIGH (ref 118–369)

## 2020-11-03 LAB — FERRITIN: Ferritin: 18 ng/mL (ref 15–150)

## 2020-11-03 LAB — ANA W/REFLEX: Anti Nuclear Antibody (ANA): NEGATIVE

## 2020-11-03 NOTE — Telephone Encounter (Signed)
-----   Message from Star Age, MD sent at 11/03/2020  9:36 AM EDT ----- Please call patient regarding her blood work.  Her iron storage protein called ferritin was low, for restless legs patients we recommend that the ferritin be above 50, hers was 18.  Her vitamin B12 level was elevated.  She has a history of anemia.  I would recommend follow-up with her primary care physician for anemia evaluation and management.  For now, if she can tolerate an over-the-counter iron supplement, I would advise her to start an over-the-counter iron pill daily.

## 2020-11-03 NOTE — Telephone Encounter (Signed)
I called pt and we discussed results of labs. Pt is agreeable to starting over the counter iron supplementation.  She reports she will check in with Dr. Larose Kells on vit-b12 level and I will send report to him as well.   Pt voiced appreciation for the call.

## 2020-11-07 ENCOUNTER — Telehealth: Payer: Self-pay | Admitting: *Deleted

## 2020-11-07 NOTE — Telephone Encounter (Signed)
Request faxed to Dr Frederik Pear @ [phone]

## 2020-11-11 ENCOUNTER — Other Ambulatory Visit (HOSPITAL_COMMUNITY): Payer: Self-pay | Admitting: *Deleted

## 2020-11-11 DIAGNOSIS — R1310 Dysphagia, unspecified: Secondary | ICD-10-CM

## 2020-11-11 DIAGNOSIS — R059 Cough, unspecified: Secondary | ICD-10-CM

## 2020-11-16 ENCOUNTER — Other Ambulatory Visit: Payer: Self-pay | Admitting: Internal Medicine

## 2020-11-16 ENCOUNTER — Other Ambulatory Visit: Payer: Self-pay | Admitting: Endocrinology

## 2020-11-17 ENCOUNTER — Ambulatory Visit (HOSPITAL_COMMUNITY)
Admission: RE | Admit: 2020-11-17 | Discharge: 2020-11-17 | Disposition: A | Discharge status: Home / Self Care | Payer: Medicare Other | Source: Ambulatory Visit | Attending: Neurological Surgery | Admitting: Neurological Surgery

## 2020-11-17 ENCOUNTER — Other Ambulatory Visit: Payer: Self-pay

## 2020-11-17 DIAGNOSIS — R059 Cough, unspecified: Secondary | ICD-10-CM | POA: Diagnosis present

## 2020-11-17 DIAGNOSIS — R1310 Dysphagia, unspecified: Secondary | ICD-10-CM | POA: Diagnosis present

## 2020-11-23 ENCOUNTER — Ambulatory Visit: Payer: Medicare Other | Admitting: Neurology

## 2020-11-28 ENCOUNTER — Ambulatory Visit: Payer: PRIVATE HEALTH INSURANCE | Admitting: Neurology

## 2020-11-29 ENCOUNTER — Telehealth: Payer: Self-pay

## 2020-11-29 MED ORDER — ROSUVASTATIN CALCIUM 5 MG PO TABS: 5.0000 mg | ORAL_TABLET | Freq: Every day | ORAL | 1 refills | Status: DC

## 2020-11-29 NOTE — Telephone Encounter (Signed)
Rx sent 

## 2020-11-29 NOTE — Telephone Encounter (Signed)
Please advise 

## 2020-11-29 NOTE — Telephone Encounter (Signed)
Okay to refill rosuvastatin x6 months

## 2020-11-29 NOTE — Telephone Encounter (Signed)
Pt called stating she is out of rosuvastatin that her cardiologist in [facility] prescribes to her.  She lives there 6 months out of the year.  She called for a refill and found out he is no longer practicing there.  She is wanting to know if Dr. Larose Kells will refill this medication for her.  She did state she has a cardiologist here, but has not reached out to him/her for this refill.  OR [facility] is giving her a few pills to get her through a few days.

## 2020-12-01 ENCOUNTER — Encounter (INDEPENDENT_AMBULATORY_CARE_PROVIDER_SITE_OTHER): Payer: Medicare Other | Admitting: Diagnostic Neuroimaging

## 2020-12-01 ENCOUNTER — Other Ambulatory Visit: Payer: Self-pay

## 2020-12-01 ENCOUNTER — Ambulatory Visit (INDEPENDENT_AMBULATORY_CARE_PROVIDER_SITE_OTHER): Payer: Medicare Other | Admitting: Diagnostic Neuroimaging

## 2020-12-01 ENCOUNTER — Ambulatory Visit (INDEPENDENT_AMBULATORY_CARE_PROVIDER_SITE_OTHER): Payer: Medicare Other

## 2020-12-01 DIAGNOSIS — Z8669 Personal history of other diseases of the nervous system and sense organs: Secondary | ICD-10-CM

## 2020-12-01 DIAGNOSIS — E538 Deficiency of other specified B group vitamins: Secondary | ICD-10-CM

## 2020-12-01 DIAGNOSIS — G629 Polyneuropathy, unspecified: Secondary | ICD-10-CM | POA: Diagnosis not present

## 2020-12-01 DIAGNOSIS — D508 Other iron deficiency anemias: Secondary | ICD-10-CM

## 2020-12-01 DIAGNOSIS — Z0289 Encounter for other administrative examinations: Secondary | ICD-10-CM

## 2020-12-01 DIAGNOSIS — G2581 Restless legs syndrome: Secondary | ICD-10-CM

## 2020-12-01 MED ORDER — CYANOCOBALAMIN 1000 MCG/ML IJ SOLN
1000.0000 ug | Freq: Once | INTRAMUSCULAR | Status: AC
  Administered 2020-12-01: 1000 ug via INTRAMUSCULAR

## 2020-12-01 NOTE — Progress Notes (Signed)
Pt is here today for B12 injection. Pt was given B12 injection in left deltoid. Pt tolerated well

## 2020-12-05 NOTE — Procedures (Signed)
[facility]  NCS (NERVE CONDUCTION STUDY) WITH EMG (ELECTROMYOGRAPHY) REPORT   STUDY DATE: 12/05/20 PATIENT NAME: Sharon Armstrong DOB: [birth year] MRN: [id number]  ORDERING CLINICIAN: Star Age, MD PhD   TECHNOLOGIST: Sherre Scarlet ELECTROMYOGRAPHER: Earlean Polka. Dava Rensch, MD  CLINICAL INFORMATION: 80 year old female with neuropathy and restless leg syndrome.  FINDINGS: NERVE CONDUCTION STUDY: Bilateral peroneal and tibial motor responses are normal.  Bilateral sural and superficial peroneal sensory responses have decreased amplitudes.  Bilateral tibial F wave latencies are slightly prolonged.    NEEDLE ELECTROMYOGRAPHY:  Needle examination of right lower extremity is normal.   IMPRESSION:   Abnormal study demonstrating: - Mild axonal sensory polyneuropathy. - Slightly prolonged F-wave latencies may indicate mild bilateral lumbosacral radiculopathies.   INTERPRETING PHYSICIAN:  Penni Bombard, MD Certified in Neurology, Neurophysiology and Neuroimaging  [facility] [address] [place], [place] [address] [phone]   [facility]    Nerve / Sites Muscle Latency Ref. Amplitude Ref. Rel Amp Segments Distance Velocity Ref. Area    ms ms mV mV %  cm m/s m/s mVms  L Peroneal - EDB     Ankle EDB 4.8 ?6.5 2.9 ?2.0 100 Ankle - EDB 9   6.4     Fib head EDB 10.4  2.3  78 Fib head - Ankle 26 47 ?44 6.1     Pop fossa EDB 12.6  2.4  105 Pop fossa - Fib head 10 46 ?44 6.3         Pop fossa - Ankle      R Peroneal - EDB     Ankle EDB 4.6 ?6.5 2.9 ?2.0 100 Ankle - EDB 9   7.1     Fib head EDB 10.5  2.8  97.4 Fib head - Ankle 26 44 ?44 7.2     Pop fossa EDB 12.8  2.7  96.4 Pop fossa - Fib head 10 44 ?44 7.2         Pop fossa - Ankle      L Tibial - AH     Ankle AH 3.8 ?5.8 6.8 ?4.0 100 Ankle - AH 9   10.6     Pop fossa AH 12.8  3.8  55.9 Pop fossa - Ankle 37 41 ?41 11.8  R Tibial - AH     Ankle AH 3.9 ?5.8 5.3 ?4.0 100 Ankle -  AH 9   9.9     Pop fossa AH 12.8  3.2  59.8 Pop fossa - Ankle 37 41 ?41 7.6             SNC    Nerve / Sites Rec. Site Peak Lat Ref.  Amp Ref. Segments Distance    ms ms V V  cm  L Sural - Ankle (Calf)     Calf Ankle 3.7 ?4.4 3 ?6 Calf - Ankle 14  R Sural - Ankle (Calf)     Calf Ankle 3.9 ?4.4 3 ?6 Calf - Ankle 14  L Superficial peroneal - Ankle     Lat leg Ankle 4.3 ?4.4 2 ?6 Lat leg - Ankle 14  R Superficial peroneal - Ankle     Lat leg Ankle 4.4 ?4.4 1 ?6 Lat leg - Ankle 14             F  Wave    Nerve F Lat Ref.   ms ms  L Tibial - AH 56.7 ?56.0  R Tibial - AH 59.6 ?56.0  EMG Summary Table    Spontaneous MUAP Recruitment  Muscle IA Fib PSW Fasc Other Amp Dur. Poly Pattern  R. Vastus medialis Normal None None None _______ Normal Normal Normal Normal  R. Tibialis anterior Normal None None None _______ Increased Normal Normal Reduced  R. Gastrocnemius (Medial head) Normal None None None _______ Normal Normal Normal Normal

## 2020-12-12 ENCOUNTER — Telehealth: Payer: Self-pay | Admitting: *Deleted

## 2020-12-12 NOTE — Telephone Encounter (Signed)
-----   Message from Star Age, MD sent at 12/12/2020  7:02 AM EDT ----- Please call patient and advise her that her nerve and muscle electrical test called EMG/NCV showed mild abnormalities in keeping with mild nerve damage which we call neuropathy.  She also has some evidence of pinched nerve type changes from the back.  For now, I recommend staying the course as discussed during her appointment.  She was advised to follow-up with her primary care regarding anemia management as this can help with restless leg symptoms and she was also advised to discuss with her neurologist in [place] the possibility of tapering the levodopa.

## 2020-12-12 NOTE — Telephone Encounter (Signed)
LMVM for pt to return call for her [place].EMG results.

## 2020-12-12 NOTE — Telephone Encounter (Signed)
As I explained to her, I am not comfortable maintaining her on levodopa.  If she would like to continue with the levodopa, she will have to follow-up with her previous neurologist.  If she would like to start tapering this, she is advised to reduce it by 1 pill every week.  She can start as soon as she would like to taper this, she will take 1 pill 3 times daily for 1 week, then 1 pill twice daily for 1 week, then 1 pill once daily for 1 week and then stop.

## 2020-12-12 NOTE — Telephone Encounter (Signed)
Spoke to pt.  I relayed the [place]/EMG results per Dr. Rexene Alberts.  She asks if received records from Dr. Frederik Pear. In [place] who takes care of RLS and Neuropathy.  She does not want to go back to him, want Dr. Rexene Alberts to takeover the levadopa.  I relayed that she is to contact the neurologist in [place] about the tapering of levadopa.  She has appt 02-27-21 for go over results.  She may need to come in earlier to address her requests as she was understanding that she wanted Dr. Rexene Alberts to takeover her care re: this issue.

## 2020-12-14 ENCOUNTER — Encounter: Payer: Self-pay | Admitting: Internal Medicine

## 2020-12-14 ENCOUNTER — Ambulatory Visit (INDEPENDENT_AMBULATORY_CARE_PROVIDER_SITE_OTHER): Payer: Medicare Other | Admitting: Internal Medicine

## 2020-12-14 ENCOUNTER — Other Ambulatory Visit: Payer: Self-pay

## 2020-12-14 VITALS — BP 122/72 | HR 81 | Temp 98.2°F | Resp 16 | Ht 64.0 in | Wt 144.4 lb

## 2020-12-14 DIAGNOSIS — I10 Essential (primary) hypertension: Secondary | ICD-10-CM | POA: Diagnosis not present

## 2020-12-14 DIAGNOSIS — D649 Anemia, unspecified: Secondary | ICD-10-CM | POA: Diagnosis not present

## 2020-12-14 DIAGNOSIS — Z1231 Encounter for screening mammogram for malignant neoplasm of breast: Secondary | ICD-10-CM | POA: Diagnosis not present

## 2020-12-14 DIAGNOSIS — E038 Other specified hypothyroidism: Secondary | ICD-10-CM | POA: Diagnosis not present

## 2020-12-14 DIAGNOSIS — G2581 Restless legs syndrome: Secondary | ICD-10-CM

## 2020-12-14 LAB — CBC WITH DIFFERENTIAL/PLATELET
Basophils Absolute: 0 10*3/uL (ref 0.0–0.1)
Basophils Relative: 0.2 % (ref 0.0–3.0)
Eosinophils Absolute: 0 10*3/uL (ref 0.0–0.7)
Eosinophils Relative: 0 % (ref 0.0–5.0)
HCT: 35.5 % — ABNORMAL LOW (ref 36.0–46.0)
Hemoglobin: 11.5 g/dL — ABNORMAL LOW (ref 12.0–15.0)
Lymphocytes Relative: 34.2 % (ref 12.0–46.0)
Lymphs Abs: 2.3 10*3/uL (ref 0.7–4.0)
MCHC: 32.4 g/dL (ref 30.0–36.0)
MCV: 91.9 fl (ref 78.0–100.0)
Monocytes Absolute: 0.6 10*3/uL (ref 0.1–1.0)
Monocytes Relative: 8.6 % (ref 3.0–12.0)
Neutro Abs: 3.8 10*3/uL (ref 1.4–7.7)
Neutrophils Relative %: 57 % (ref 43.0–77.0)
Platelets: 134 10*3/uL — ABNORMAL LOW (ref 150.0–400.0)
RBC: 3.86 Mil/uL — ABNORMAL LOW (ref 3.87–5.11)
RDW: 16.6 % — ABNORMAL HIGH (ref 11.5–15.5)
WBC: 6.7 10*3/uL (ref 4.0–10.5)

## 2020-12-14 LAB — COMPREHENSIVE METABOLIC PANEL
ALT: 9 U/L (ref 0–35)
AST: 33 U/L (ref 0–37)
Albumin: 4.1 g/dL (ref 3.5–5.2)
Alkaline Phosphatase: 37 U/L — ABNORMAL LOW (ref 39–117)
BUN: 31 mg/dL — ABNORMAL HIGH (ref 6–23)
CO2: 27 mEq/L (ref 19–32)
Calcium: 9.4 mg/dL (ref 8.4–10.5)
Chloride: 103 mEq/L (ref 96–112)
Creatinine, Ser: 0.97 mg/dL (ref 0.40–1.20)
GFR: 55.53 mL/min — ABNORMAL LOW (ref >60.00)
Glucose, Bld: 138 mg/dL — ABNORMAL HIGH (ref 70–99)
Potassium: 4.4 mEq/L (ref 3.5–5.1)
Sodium: 141 mEq/L (ref 135–145)
Total Bilirubin: 0.6 mg/dL (ref 0.2–1.2)
Total Protein: 7.3 g/dL (ref 6.0–8.3)

## 2020-12-14 LAB — TSH: TSH: 1.23 u[IU]/mL (ref 0.35–5.50)

## 2020-12-14 NOTE — Progress Notes (Signed)
 Subjective:    Patient ID: Sharon Armstrong, female    DOB: 01/29/1941, 79 y.o.   MRN: [id number]  DOS:  12/14/2020 Type of visit - description: Acute, here with her husband  Her main concern today is anemia, she was found to have low iron   during recent neurology evaluation for RLS. On further questions, she also reports dark stools for 2 months.  Apparently not diarrhea.  Dark stools are daily. She has chronic nausea and vomits frequently.  Vomiting is not necessarily postprandial, no hematemesis. She has heartburn. No diarrhea, occasional constipation.  She takes meloxicam but rarely.    Review of Systems See above   Past Medical History:  Diagnosis Date   Adenomatous polyps 11/1998   Anemia    Anxiety    Aortic valve insufficiency    mild   Breast cancer (HCC) 04/2007   s/p XRT (last 4/09)   Cervical myelopathy (HCC)    Cervical stenosis of spine    sees neuro surgery and dr.ramons s/p local injection 4/09   Chronic constipation    Chronic pancreatitis (HCC) 09/29/2014   Colon polyps 02/2010   Tubular  Adenomas                                      Diabetes mellitus    Difficulty in urination    pt uses cath at times   Diverticulosis of colon    Elevated liver function tests    fatty liver per CT 2009   Fatty liver    Full body hives    if patient does not take Fexofenadine   Gastroparesis 10/2015   gastric emptying abnormal for 4 hours   GERD (gastroesophageal reflux disease)    Hemorrhoids    Hyperlipemia    Hypertension    Hypothyroidism    Insomnia    Lumbar spondylosis    Lumbar epidural steroid injection L5-S1 to the left, Dr. Ramos   Mitral valve regurgitation    Osteoarthritis    Personal history of radiation therapy 2009   Positive PPD    never treated father died TB   Restless leg syndrome    Urticaria 06/2009    Past Surgical History:  Procedure Laterality Date   ANTERIOR CERVICAL DECOMP/DISCECTOMY FUSION N/A 01/08/2013   Procedure:  Cervical three-four, four-five, five-six anterior cervical decompression with fusion plating and bonegraft;  Surgeon: David S Jones, MD;  Location: [facility];  Service: Neurosurgery;  Laterality: N/A;  Cervical three-four, four-five, five-six anterior cervical decompression with fusion plating and bonegraft   ANTERIOR CERVICAL DECOMP/DISCECTOMY FUSION N/A 03/25/2020   Procedure: CERVICAL TWO-THREE ANTERIOR CERVICAL DECOMPRESSION/DISCECTOMY FUSION WITH REMOVAL OF PLATE;  Surgeon: Jones, David S, MD;  Location: [facility];  Service: Neurosurgery;  Laterality: N/A;  3C   APPENDECTOMY     BREAST LUMPECTOMY Left 2009   left breast   CARDIAC CATHETERIZATION  2001   CATARACT EXTRACTION, BILATERAL  2/11   EYE SURGERY     GANGLION CYST EXCISION Right    wrist   sinus cyst removed     TRANSCATHETER AORTIC VALVE REPLACEMENT, TRANSAORTIC  01/09/2019   UTERINE FIBROID SURGERY      Allergies as of 12/14/2020       Reactions   Ciprofloxacin Hives, Swelling   Gabapentin Swelling   Edema, legs    Iodine Hives   Iohexol    Nsaids Other (See   Comments)   REACTION: hallucinations Tolerates Meloxicam    Spironolactone    Elevates potassium   Tolmetin Other (See Comments)   REACTION: hallucinations   Aciphex [rabeprazole] Rash   Ioxaglate Rash, Hives   Ivp Dye [iodinated Diagnostic Agents] Hives, Rash   Nexium [esomeprazole Magnesium] Rash   Pepcid [famotidine] Rash   Protonix [pantoprazole Sodium] Rash        Medication List        Accurate as of December 14, 2020 10:35 AM. If you have any questions, ask your nurse or doctor.          STOP taking these medications    Accu-Chek FastClix Lancets Misc Stopped by: Kathlene November, MD       TAKE these medications    Accu-Chek Guide test strip Generic drug: glucose blood USE TO TEST BLOOD SUGAR TWICE DAILY- DX CODE E11.9 What changed: Another medication with the same name was removed. Continue taking this medication, and follow the directions  you see here. Changed by: Kathlene November, MD   amLODipine 5 MG tablet Commonly known as: NORVASC Take 5 mg by mouth daily.   aspirin EC 81 MG tablet Take 81 mg by mouth daily.   carbidopa-levodopa 25-250 MG tablet Commonly known as: SINEMET IR Take 1 tablet by mouth 4 (four) times daily.   Cyanocobalamin 1000 MCG/ML Kit Inject 1,000 mcg as directed See admin instructions. Inject 1088mg IM every month and a half.   denosumab 60 MG/ML Sosy injection Commonly known as: PROLIA Inject 60 mg into the skin every 6 (six) months.   HYDROcodone-acetaminophen 5-325 MG tablet Commonly known as: NORCO/VICODIN Take 1 tablet by mouth every 4 (four) hours as needed for moderate pain ((score 4 to 6)).   Insulin Pen Needle 31G X 6 MM Misc Use to inject insulin up to 4 times daily.   Lantus SoloStar 100 UNIT/ML Solostar Pen Generic drug: insulin glargine INJECT 35 UNITS SUBCUTANEOUSLY ONCE DAILY   levothyroxine 100 MCG tablet Commonly known as: SYNTHROID 1 tablet before breakfast, please make follow-up appointment   Lidocaine 4 % Ptch Apply topically.   lisinopril 40 MG tablet Commonly known as: ZESTRIL Take 40 mg by mouth daily.   meloxicam 7.5 MG tablet Commonly known as: MOBIC Take 1 tablet (7.5 mg total) by mouth daily as needed for pain.   metFORMIN 1000 MG tablet Commonly known as: GLUCOPHAGE Take 1,000 mg by mouth 2 (two) times daily with a meal.   MILK THISTLE PLUS PO Take 225 mg by mouth in the morning and at bedtime.   NovoLOG FlexPen 100 UNIT/ML FlexPen Generic drug: insulin aspart INJECT 5-7 UNITS BEFORE DINNER AND LARGER MEALS What changed:  how much to take how to take this when to take this reasons to take this additional instructions   ondansetron 4 MG disintegrating tablet Commonly known as: ZOFRAN-ODT Take 1 tablet (4 mg total) by mouth every 8 (eight) hours as needed for nausea or vomiting. What changed: Another medication with the same name was removed.  Continue taking this medication, and follow the directions you see here. Changed by: JKathlene November MD   rosuvastatin 5 MG tablet Commonly known as: CRESTOR Take 1 tablet (5 mg total) by mouth daily.   traMADol 50 MG tablet Commonly known as: ULTRAM Take 100 mg by mouth every 6 (six) hours as needed for moderate pain.   triamterene-hydrochlorothiazide 37.5-25 MG tablet Commonly known as: MAXZIDE-25 Take 1 tablet by mouth daily.   zolpidem 5 MG tablet  Commonly known as: AMBIEN Take 1 tablet (5 mg total) by mouth at bedtime as needed for sleep.           Objective:   Physical Exam BP 122/72 (BP Location: Left Arm, Patient Position: Sitting, Cuff Size: Small)   Pulse 81   Temp 98.2 F (36.8 C) (Oral)   Resp 16   Ht 5' 4" (1.626 m)   Wt 144 lb 6 oz (65.5 kg)   SpO2 98%   BMI 24.78 kg/m  General:   Well developed, NAD, BMI noted.  HEENT:  Normocephalic . Face symmetric, atraumatic Lungs:  CTA B Normal respiratory effort, no intercostal retractions, no accessory muscle use. Heart: RRR, soft systolic murmur noted Abdomen:  Not distended, soft, mildly tender at the epigastric area without mass or rebound. Skin: Not pale. Not jaundice Lower extremities: no pretibial edema bilaterally  Neurologic:  alert & oriented X3.  Speech normal, gait appropriate for age and unassisted Psych--  Cognition and judgment appear intact.  Cooperative with normal attention span and concentration.  Behavior appropriate. No anxious or depressed appearing.     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 Started Prolia approximately 05-2011 GI: used to see Dr Stark, last OV 10-2014 , now Dr Fina  --GERD, --Chronic pancreatitis, h/o episodes of acute pancreatitis --h/o fatty liver and ? Of early cirrhosis by MRI --Chronic constipation --Nausea, vomiting, eval in [place]~ 12- 2016: Delayed gastric emptying, EGD biopsy no H. Pylori,SIBO +, Rx ABX Breast cancer dx 2008 XRT H/o +PPD H/o Urticaria H/ o Mild aortic valve insufficiency   PLAN: DM: Per Endo HTN: BP today is very good, reports good ambulatory BPs.  Check a CMP.  Continue amlodipine, Maxide. Hypothyroidism: Supposed to follow-up by Endo, I do not see any recent TSH, on Synthroid, check labs. Anemia: Ferritin was low when she saw neurology.  She reports black stools for 2 months, she has some nausea, vomiting and epigastric tenderness on exam. Meloxicam is on his list but she reports she takes that rarely. Also, liver changes consistent with cirrhosis noted on MRI 12/2019 ordered by  urology  Plan: To see GI today, will defer PPIs to them. Stop meloxicam ER if severe bleeding.  Check CBC. Encouraged to discuss cirrhosis findings on MRI (copy of the MRI provided). RLS, saw neurology 11/02/2020, next visit October. Local neuro rec to d/c carvidopa-levodopa Rx by another neurologist Polyneuropathy, bilateral lumbosacral radiculopathies: Demonstrated at a nerve conductive study 11-2020 ordered by neurology. Cardiovascular: Saw cardiology 09/28/2020, next office visit 3 months Had an echo 11/11/2020.    Other: The patient sees multiple specialists in two states ([place] and  [place]), advised patient her care is very confusing, this creates an environment that facilitate errors or missing things.  She actually agreed with me and he states other doctors have said the same. Strongly recommend to see only one set of specialists. RTC 3 months  Time spent  with the patient: 40 minutes. I reviewed the chart, advised patient   to have only 1 set up for specialists. This visit occurred during the SARS-CoV-2 public health emergency.  Safety protocols were in place, including screening questions prior to the visit, additional usage of staff PPE, and extensive cleaning of exam room while observing appropriate contact time as indicated for disinfecting solutions.   

## 2020-12-14 NOTE — Patient Instructions (Addendum)
You are due for your yearly mammogram. We have placed an order for you. Please stop downstairs ([address]) to schedule an appointment at your convenience.    See your gastroenterology today Stop meloxicam You likely will need an acid reducer, I will defer to them Bring a copy of the MRI of your abdomen, it shows cirrhosis.   I strongly encourage you to see a single set of specialists in one state otherwise your care will continue to be very confusing and errors are possible.    GO TO THE LAB : Get the blood work     [place], [place] back for a checkup in 3 months

## 2020-12-14 NOTE — Telephone Encounter (Signed)
Called pt and LVM asking for call back discuss a message from Dr Rexene Alberts.

## 2020-12-15 ENCOUNTER — Telehealth: Payer: Self-pay | Admitting: Neurology

## 2020-12-15 NOTE — Telephone Encounter (Signed)
Please see my chart message for reference.  She is advised to taper off levodopa.  We can consider an alternative treatment once she is off the medication.  As I explained before, I think it is important that her anemia be addressed and treated as treating iron deficiency and anemia can help with restless leg symptoms.  We can schedule a follow-up appointment within the next month, please assist with making the appointment, we can talk about alternative treatment options for restless leg syndrome at the time and consider starting a new medication.  I would like for her to focus on anemia management at this time and taper off the levodopa as instructed.

## 2020-12-15 NOTE — Telephone Encounter (Signed)
LMVM for pt that returning call (#2) re: follow up.   I did send message , Dr. Guadelupe Sabin response to pt as well since have not heard back from her.

## 2020-12-15 NOTE — Telephone Encounter (Signed)
Pt returned phone call, would like a call back.  

## 2020-12-15 NOTE — Assessment & Plan Note (Signed)
DM: Per Endo HTN: BP today is very good, reports good ambulatory BPs.  Check a CMP.  Continue amlodipine, Maxide. Hypothyroidism: Supposed to follow-up by Endo, I do not see any recent TSH, on Synthroid, check labs. Anemia: Ferritin was low when she saw neurology.  She reports black stools for 2 months, she has some nausea, vomiting and epigastric tenderness on exam. Meloxicam is on his list but she reports she takes that rarely. Also, liver changes consistent with cirrhosis noted on MRI 12/2019 ordered by  urology  Plan: To see GI today, will defer PPIs to them. Stop meloxicam ER if severe bleeding.  Check CBC. Encouraged to discuss cirrhosis findings on MRI (copy of the MRI provided). RLS, saw neurology 11/02/2020, next visit October. Local neuro rec to d/c carvidopa-levodopa Rx by another neurologist Polyneuropathy, bilateral lumbosacral radiculopathies: Demonstrated at a nerve conductive study 11-2020 ordered by neurology. Cardiovascular: Saw cardiology 09/28/2020, next office visit 3 months Had an echo 11/11/2020.    Other: The patient sees multiple specialists in two states ([place] and  [place]), advised patient her care is very confusing, this creates an environment that facilitate errors or missing things.  She actually agreed with me and he states other doctors have said the same. Strongly recommend to see only one set of specialists. RTC 3 months

## 2020-12-19 NOTE — Telephone Encounter (Signed)
I called pt and relayed that to continue on toper of levodopa.  She is to see about her anemia and she is seeing GI Dr. Simmie Davies with [facility] for this (due to have endoscopy and colonoscopy) to find if any bleeding.  Made appt for 01-31-21 at 0730 to RV (to address other med recommendations at that time).  Pt appreciated call back.

## 2020-12-29 ENCOUNTER — Encounter: Payer: Self-pay | Admitting: Diagnostic Neuroimaging

## 2021-01-03 ENCOUNTER — Telehealth: Payer: Self-pay | Admitting: Neurology

## 2021-01-03 ENCOUNTER — Ambulatory Visit: Payer: Medicare Other

## 2021-01-03 NOTE — Telephone Encounter (Signed)
Pt called wanting to speak to the RN regarding her cutting back on the carbidopa-levodopa (SINEMET IR) 25-250 MG tablet. Pt states that her legs are going crazy and she is needing to discuss.

## 2021-01-04 NOTE — Telephone Encounter (Signed)
Sent mychart message to the pt on this. See message from 01/04/2021.

## 2021-01-04 NOTE — Telephone Encounter (Signed)
Pt called asking for a call back in regards to her carbidopa-levodopa (SINEMET IR) 25-250 MG tablet.

## 2021-01-05 MED ORDER — PRAMIPEXOLE DIHYDROCHLORIDE 0.125 MG PO TABS: 0.3750 mg | ORAL_TABLET | Freq: Every day | ORAL | 3 refills | Status: DC

## 2021-01-05 NOTE — Telephone Encounter (Signed)
See mychart message from 01/05/2021, pt notified.

## 2021-01-05 NOTE — Telephone Encounter (Signed)
Please call patient and advise her to continue to taper her levodopa as planned.  We can go ahead and start her on Mirapex/pramipexole.  Mirapex (generic name: pramipexole) 0.125 mg: Take 1 pill each night for 1 week, the 2 pills each night for 1 week, then 3 pills each night thereafter. Common side effects reported are: Sedation, sleepiness, nausea, vomiting, and rare side effects are confusion, hallucinations, swelling in legs, and abnormal behaviors, including impulse control problems, which can manifest as excessive eating, obsessions with food or gambling, or hypersexuality.  Please provide above verbal instructions.  I have placed a prescription into her chart and sent it to her pharmacy on file, [facility] in [place].  She can keep her appointment as planned.

## 2021-01-05 NOTE — Telephone Encounter (Signed)
This was the response from the pt. Will fwd to MD for review.    Thank you for your message!  I am now down to 1 pill per day, on my third day at one pill per day,  and have 4 days left to go.  I am having a hard time making it through the day and in significant pain.  My legs are giving me problems.    I need to know what the process is, what the transition will be, what the replacement medication will be and when I will be able to start it.  Also, What will be the follow up?  As I say, I am 4 days from being off the carbadopa and have no idea what the process is from here!    Please let me know as soon as possible!  I can do a virtual appointment if necessary.    If you need to contact me, either message or call at [phone]   Thank you for your response.     Sharon Armstrong

## 2021-01-09 ENCOUNTER — Ambulatory Visit (HOSPITAL_BASED_OUTPATIENT_CLINIC_OR_DEPARTMENT_OTHER)
Admission: RE | Admit: 2021-01-09 | Discharge: 2021-01-09 | Disposition: A | Discharge status: Home / Self Care | Payer: Medicare Other | Source: Ambulatory Visit | Attending: Internal Medicine | Admitting: Internal Medicine

## 2021-01-09 ENCOUNTER — Encounter (HOSPITAL_BASED_OUTPATIENT_CLINIC_OR_DEPARTMENT_OTHER): Payer: Self-pay

## 2021-01-09 ENCOUNTER — Other Ambulatory Visit: Payer: Self-pay

## 2021-01-09 DIAGNOSIS — Z1231 Encounter for screening mammogram for malignant neoplasm of breast: Secondary | ICD-10-CM | POA: Diagnosis present

## 2021-01-18 ENCOUNTER — Telehealth: Payer: Self-pay | Admitting: Internal Medicine

## 2021-01-18 NOTE — Telephone Encounter (Signed)
Requesting: Ambien '5mg'$  Contract: Needs updated contract at next visit UDS: None Last Visit:12/14/2020 Next Visit: 03/10/2021 Last Refill: 06/02/2020 #30 and 1RF  Please Advise

## 2021-01-18 NOTE — Telephone Encounter (Signed)
Patient called regarding medication refill for Ambien, she was made aware that a note was already documented.

## 2021-01-18 NOTE — Telephone Encounter (Signed)
PDMP okay, Rx sent 

## 2021-01-26 ENCOUNTER — Encounter: Payer: Self-pay | Admitting: Internal Medicine

## 2021-01-26 LAB — HM COLONOSCOPY

## 2021-01-30 NOTE — Telephone Encounter (Signed)
Called Sharon Armstrong to r/s appointment on 9/13 due to Dr. Rexene Alberts being out. Sharon Armstrong states she is doing great on medication and "feels like a brand new person". Sharon Armstrong stated she would keep scheduled appointment with Dr. Rexene Alberts on 10/10. Offered sooner f/u with NP and Sharon Armstrong refused.

## 2021-01-31 ENCOUNTER — Ambulatory Visit: Payer: Self-pay | Admitting: Neurology

## 2021-02-27 ENCOUNTER — Ambulatory Visit (INDEPENDENT_AMBULATORY_CARE_PROVIDER_SITE_OTHER): Payer: Medicare Other | Admitting: Neurology

## 2021-02-27 ENCOUNTER — Encounter: Payer: Self-pay | Admitting: Neurology

## 2021-02-27 VITALS — BP 135/67 | HR 78 | Ht 64.0 in | Wt 148.0 lb

## 2021-02-27 DIAGNOSIS — Z8669 Personal history of other diseases of the nervous system and sense organs: Secondary | ICD-10-CM

## 2021-02-27 DIAGNOSIS — D508 Other iron deficiency anemias: Secondary | ICD-10-CM

## 2021-02-27 DIAGNOSIS — G2581 Restless legs syndrome: Secondary | ICD-10-CM

## 2021-02-27 MED ORDER — PRAMIPEXOLE DIHYDROCHLORIDE 0.125 MG PO TABS: 0.2500 mg | ORAL_TABLET | Freq: Every day | ORAL | 3 refills | Status: DC
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
## 2021-02-27 NOTE — Patient Instructions (Signed)
It was nice to see you both again today.  As discussed, we will reduce your pramipexole to 2 pills at night, take around 9 PM.  I have adjusted your prescription for 90-day supply with refills.  We will plan a follow-up in about 6 months when you are back from [place] in April.

## 2021-02-28 ENCOUNTER — Telehealth: Payer: Self-pay | Admitting: Internal Medicine

## 2021-02-28 DIAGNOSIS — D649 Anemia, unspecified: Secondary | ICD-10-CM
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
## 2021-02-28 NOTE — Telephone Encounter (Signed)
Please advise 

## 2021-03-01 ENCOUNTER — Telehealth: Payer: Self-pay | Admitting: Neurology

## 2021-03-01 NOTE — Telephone Encounter (Signed)
Spoke w/ Francee Piccolo, Pt's husband, lab appt scheduled 03/02/21. Orders placed. They still plan to come to visit on 10/18 to see PCP.

## 2021-03-01 NOTE — Telephone Encounter (Signed)
Spoke with patient and provided Dr. Guadelupe Sabin instructions and advice regarding the Pramipexole. Patient verbalized understanding and appreciation for the call.  I encouraged to call us back with an update.

## 2021-03-01 NOTE — Telephone Encounter (Signed)
Pramipexole is not a form of gabapentin.  Nevertheless, it can certainly cause swelling of the lower extremities.  If Sharon Armstrong has swelling with the low-dose pramipexole, Sharon Armstrong can taper off of it.  Sharon Armstrong can reduce further to 1 pill each night for the next 3 nights and stop it after that.

## 2021-03-01 NOTE — Telephone Encounter (Signed)
Pt called stating the pramipexole (MIRAPEX) 0.125 MG tablet is causing her legs to swell. Pt states her heart doctor told her she should come off of it completely since it has a form for gabapentin. Pt requesting a call back.

## 2021-03-01 NOTE — Telephone Encounter (Signed)
Okay to check a CBC and anemia panel. DX anemia I still recommend to keep the appointment with me

## 2021-03-02 ENCOUNTER — Other Ambulatory Visit (INDEPENDENT_AMBULATORY_CARE_PROVIDER_SITE_OTHER): Payer: Medicare Other

## 2021-03-02 ENCOUNTER — Other Ambulatory Visit: Payer: Self-pay

## 2021-03-02 DIAGNOSIS — D649 Anemia, unspecified: Secondary | ICD-10-CM | POA: Diagnosis not present

## 2021-03-02 LAB — CBC WITH DIFFERENTIAL/PLATELET
Basophils Absolute: 0 K/uL (ref 0.0–0.1)
Basophils Relative: 0.3 % (ref 0.0–3.0)
Eosinophils Absolute: 0 K/uL (ref 0.0–0.7)
Eosinophils Relative: 0 % (ref 0.0–5.0)
HCT: 31.5 % — ABNORMAL LOW (ref 36.0–46.0)
Hemoglobin: 10.3 g/dL — ABNORMAL LOW (ref 12.0–15.0)
Lymphocytes Relative: 28.9 % (ref 12.0–46.0)
Lymphs Abs: 1.5 K/uL (ref 0.7–4.0)
MCHC: 32.8 g/dL (ref 30.0–36.0)
MCV: 94.6 fl (ref 78.0–100.0)
Monocytes Absolute: 0.3 K/uL (ref 0.1–1.0)
Monocytes Relative: 6.1 % (ref 3.0–12.0)
Neutro Abs: 3.3 K/uL (ref 1.4–7.7)
Neutrophils Relative %: 64.7 % (ref 43.0–77.0)
Platelets: 119 K/uL — ABNORMAL LOW (ref 150.0–400.0)
RBC: 3.33 Mil/uL — ABNORMAL LOW (ref 3.87–5.11)
RDW: 15 % (ref 11.5–15.5)
WBC: 5 K/uL (ref 4.0–10.5)

## 2021-03-02 LAB — IBC + FERRITIN
Ferritin: 22.1 ng/mL (ref 10.0–291.0)
Iron: 67 ug/dL (ref 42–145)
Saturation Ratios: 15.2 % — ABNORMAL LOW (ref 20.0–50.0)
TIBC: 441 ug/dL (ref 250.0–450.0)
Transferrin: 315 mg/dL (ref 212.0–360.0)

## 2021-03-07 ENCOUNTER — Other Ambulatory Visit: Payer: Self-pay

## 2021-03-07 ENCOUNTER — Ambulatory Visit (INDEPENDENT_AMBULATORY_CARE_PROVIDER_SITE_OTHER): Payer: Medicare Other | Admitting: Internal Medicine

## 2021-03-07 ENCOUNTER — Encounter: Payer: Self-pay | Admitting: Internal Medicine

## 2021-03-07 VITALS — BP 122/84 | HR 75 | Temp 98.3°F | Resp 18 | Ht 64.0 in | Wt 150.2 lb

## 2021-03-07 DIAGNOSIS — Z952 Presence of prosthetic heart valve: Secondary | ICD-10-CM

## 2021-03-07 DIAGNOSIS — I10 Essential (primary) hypertension: Secondary | ICD-10-CM | POA: Diagnosis not present

## 2021-03-07 DIAGNOSIS — G2581 Restless legs syndrome: Secondary | ICD-10-CM

## 2021-03-07 DIAGNOSIS — D649 Anemia, unspecified: Secondary | ICD-10-CM | POA: Diagnosis not present

## 2021-03-07 DIAGNOSIS — Z23 Encounter for immunization: Secondary | ICD-10-CM

## 2021-03-07 DIAGNOSIS — E038 Other specified hypothyroidism: Secondary | ICD-10-CM
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
## 2021-03-07 NOTE — Patient Instructions (Addendum)
Be safe in [place]!    Recommend to proceed with the following vaccines at your convenience:  Covid booster Shingrix (shingles)     GO TO THE FRONT DESK, [place] back for   a checkup in April and when you come back from [place]. Come back sooner if possible

## 2021-03-08 DIAGNOSIS — D500 Iron deficiency anemia secondary to blood loss (chronic): Secondary | ICD-10-CM | POA: Insufficient documentation
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
## 2021-03-09 ENCOUNTER — Other Ambulatory Visit: Payer: Self-pay

## 2021-03-09 ENCOUNTER — Ambulatory Visit (INDEPENDENT_AMBULATORY_CARE_PROVIDER_SITE_OTHER): Payer: Medicare Other

## 2021-03-09 ENCOUNTER — Telehealth: Payer: Self-pay

## 2021-03-09 DIAGNOSIS — M818 Other osteoporosis without current pathological fracture: Secondary | ICD-10-CM | POA: Diagnosis not present

## 2021-03-09 MED ORDER — DENOSUMAB 60 MG/ML ~~LOC~~ SOSY
60.0000 mg | PREFILLED_SYRINGE | Freq: Once | SUBCUTANEOUS | Status: AC
  Administered 2021-03-09: 60 mg via SUBCUTANEOUS

## 2021-03-09 NOTE — Progress Notes (Signed)
Patient in today for Prolia injection 26ml given in right arm SQ. Patient tolerated injection well

## 2021-03-09 NOTE — Telephone Encounter (Signed)
Called left message with husband for patient to return call to office to schedule prolia injection with $0 oop cost.

## 2021-03-10 ENCOUNTER — Ambulatory Visit: Payer: Medicare Other | Admitting: Internal Medicine

## 2021-03-16 ENCOUNTER — Telehealth: Payer: Self-pay | Admitting: Internal Medicine

## 2021-03-16 ENCOUNTER — Other Ambulatory Visit: Payer: Self-pay | Admitting: Endocrinology

## 2021-03-16 NOTE — Telephone Encounter (Signed)
Requesting: Ambien 5mg   Contract: 09/27/2016 UDS: Ambien only Last Visit: 03/07/2021 Next Visit: 09/05/2021 Last Refill: 01/18/2021 #30 and 2RF  Please Advise

## 2021-03-16 NOTE — Telephone Encounter (Signed)
Medication: zolpidem (AMBIEN) 5 MG tablet   Has the patient contacted their pharmacy? No. (If no, request that the patient contact the pharmacy for the refill.) (If yes, when and what did the pharmacy advise?)  Preferred Pharmacy (with phone number or street name): [address] [place], [place]  Agent: Please be advised that RX refills may take up to 3 business days. We ask that you follow-up with your pharmacy.

## 2021-03-17 NOTE — Telephone Encounter (Signed)
PDMP reviewed, too early to refill, Got 30 tablets on 02/27/2021

## 2021-03-20 NOTE — Telephone Encounter (Signed)
Pt. Called once again and stated she would like a refill. I informed her that it is too early to refill prescription. She stated she spoke with the pharmacist and they stated her PCP could confirm its okay to refill it early and they shall. She said its been hard for her to sleep and so she's been having to take more than 5mg . She also wanted to see if she can get the amount increased to more than 5mg  as discussed her last appointment.

## 2021-03-20 NOTE — Telephone Encounter (Signed)
Advise patient: -Unable to increase Ambien to more than 5 mg. - An option would be to stop Ambien and try trazodone 50 mg: Half or 1 tablet at bedtime. - We could titrate to higher doses if needed. - If interested I could send a prescription

## 2021-03-22 MED ORDER — TRAZODONE HCL 50 MG PO TABS: 25.0000 mg | ORAL_TABLET | Freq: Every evening | ORAL | 0 refills | Status: DC | PRN

## 2021-03-22 NOTE — Addendum Note (Signed)
Addended by: Kathlene November E on: 03/22/2021 09:58 AM   Modules accepted: Orders

## 2021-03-22 NOTE — Telephone Encounter (Signed)
Advise patient: Stop Ambien Try trazodone 50 mg: Half tablet at night, increase to 1 tablet if needed. I sent a prescription.

## 2021-03-22 NOTE — Telephone Encounter (Signed)
Pt.notified

## 2021-03-22 NOTE — Addendum Note (Signed)
Addended by: Jeronimo Greaves on: 03/22/2021 10:11 AM   Modules accepted: Orders

## 2021-03-22 NOTE — Telephone Encounter (Signed)
Spoke with pt , she stated she is fine with trying trazodone , anything to help her sleep.

## 2021-03-28 ENCOUNTER — Telehealth: Payer: Self-pay | Admitting: Internal Medicine

## 2021-03-28 NOTE — Telephone Encounter (Signed)
Pt. Called and stated that the medication prescribed is not working and she is still sleep deprived.  traZODone (DESYREL) 50 MG tablet

## 2021-03-29 NOTE — Telephone Encounter (Signed)
Patient notified. Patient stated that she was not sleeping because of pain.  Advised her that she needs to call her pain management to let them know.  Increasing this medication and not treating the pain may not help with sleep.

## 2021-03-29 NOTE — Telephone Encounter (Signed)
Called the patient:  Okay to increase trazodone 50 mg to 1.5 tablet at bedtime for few days. If that is still does not help, okay to take 2 tablets at bedtime.

## 2021-04-03 NOTE — Telephone Encounter (Signed)
Spoke w/ Pt- informed of recommendations. Pt verbalized understanding.  

## 2021-04-03 NOTE — Telephone Encounter (Signed)
Trazodone is very very unlikely to cause edema. Recommend low-salt diet, leg elevation.  If worsening : see local MD

## 2021-04-03 NOTE — Telephone Encounter (Signed)
Pt states she has noticed an increase in swelling around her ankles and up her legs and believes it is the Trazodone since that is the only thing she has changed recently. An appointment was offered but patient is living in [place] for the next few months so she cannot do an OV or VV. She would like advise on what to do.

## 2021-04-03 NOTE — Telephone Encounter (Signed)
Please advise 

## 2021-04-10 ENCOUNTER — Telehealth: Payer: Self-pay | Admitting: Internal Medicine

## 2021-04-10 MED ORDER — TRAZODONE HCL 50 MG PO TABS: 25.0000 mg | ORAL_TABLET | Freq: Every evening | ORAL | 3 refills | Status: DC | PRN

## 2021-04-10 NOTE — Telephone Encounter (Signed)
Medication: traZODone (DESYREL) 50 MG tablet   Has the patient contacted their pharmacy? No. (If no, request that the patient contact the pharmacy for the refill.) (If yes, when and what did the pharmacy advise?)  Preferred Pharmacy (with phone number or street name):[id number], [place] - [address], [place] [address]  Phone:  [phone]  Fax:  [phone]   Agent: Please be advised that RX refills may take up to 3 business days. We ask that you follow-up with your pharmacy.

## 2021-04-10 NOTE — Telephone Encounter (Signed)
Rx sent 

## 2021-04-12 ENCOUNTER — Telehealth: Payer: Self-pay | Admitting: Internal Medicine

## 2021-04-12 MED ORDER — TRAZODONE HCL 50 MG PO TABS: 75.0000 mg | ORAL_TABLET | Freq: Every evening | ORAL | 3 refills | Status: DC | PRN

## 2021-04-12 NOTE — Telephone Encounter (Signed)
See telephone note from 03/28/21- pcp increased to 1.5-2 tablets daily at bedtime as needed. Sig updated and Rx resent.

## 2021-04-12 NOTE — Telephone Encounter (Signed)
Pt contacted pharmacy and advised she was not about to pick up medication because suppose to "take 0.5-1 tablets (25-50 mg total) by mouth at bedtime as needed for sleep." Pt stated Paz informed her that she could take two a day. A new prescription is needed for medication. Please advise.   [id number] [place], [place] - [address] [place], [place] [address]  Phone:  [phone]  Fax:  [phone]

## 2021-05-18 ENCOUNTER — Other Ambulatory Visit: Payer: Self-pay | Admitting: Internal Medicine

## 2021-06-29 ENCOUNTER — Encounter: Payer: Self-pay | Admitting: Internal Medicine

## 2021-07-11 ENCOUNTER — Other Ambulatory Visit: Payer: Self-pay | Admitting: Internal Medicine

## 2021-08-21 ENCOUNTER — Telehealth: Payer: Self-pay

## 2021-08-21 NOTE — Telephone Encounter (Signed)
Prolia VOB initiated via [email or web address] ? ?Last OV:  ?Next OV:  ?Last Prolia inj: 03/09/21 ?Next Prolia inj DUE: 09/08/21 ? ?

## 2021-08-23 NOTE — Telephone Encounter (Signed)
Pt ready for scheduling on or after 09/08/21 ? ?Out-of-pocket cost due at time of visit: $301 ? ?Primary: Medicare ?Prolia co-insurance: 20% (approximately $276) ?Admin fee co-insurance: 20% (approximately $25) ? ?Secondary: AARP Medicare Supp ?Prolia co-insurance: Covers Medicare Part B co-insurance and deductible.  ?Admin fee co-insurance: Covers Medicare Part B co-insurance and deductible.  ? ?Deductible: $226 of $226 met ? ?Prior Auth: not required ?PA# ?Valid:  ? ?** This summary of benefits is an estimation of the patient's out-of-pocket cost. Exact cost may vary based on individual plan coverage.  ? ?

## 2021-08-28 ENCOUNTER — Other Ambulatory Visit: Payer: Self-pay | Admitting: Internal Medicine

## 2021-08-28 ENCOUNTER — Ambulatory Visit: Payer: Medicare Other | Admitting: Neurology
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
## 2021-08-31 LAB — HEMOGLOBIN A1C: Hemoglobin A1C: 7.6

## 2021-09-05 ENCOUNTER — Ambulatory Visit: Payer: Medicare Other | Admitting: Internal Medicine

## 2021-09-05 LAB — HM DIABETES EYE EXAM

## 2021-09-21 ENCOUNTER — Telehealth: Payer: Self-pay

## 2021-09-21 NOTE — Telephone Encounter (Signed)
Caller Name Nerine Pulse ?Caller Phone Number [phone] ?Patient Name Sharon Armstrong ?Patient DOB 05-Feb-1941 ?Call Type Message Only Information Provided ?Reason for Call Request to Schedule Office Appointment ?Initial Comment Caller states that she would like to set up an appointment with Dr. Larose Kells to set up her ?appointment for her injection. She has been in [facility] for 6 months but they wouldn't do it in [place]. ?She wants to schedule for after 05/14. ?Patient request to speak to RN No ?Additional Comment Caller declined triage and states that she just wants to make sure she can get the injection as ?soon as she gets back in town since she was unable to get it in [place]. She said even if it isn't Dr. Larose Kells the RN usually does the injection. She would like a call back today. ?Disp. Time Disposition Final User ?09/21/2021 12:19:11 PM General Information Provided Yes Wynema Birch ?Call Closed By: Wynema Birch ?Transaction Date/Time: 09/21/2021 12:14:55 PM (ET) ?

## 2021-09-22 NOTE — Telephone Encounter (Signed)
Pt is now back in [place] and ready for [place]. She says she has 2 insurances and should not have to pay any OOP costs. Should we re-verify? ?

## 2021-09-22 NOTE — Telephone Encounter (Signed)
Per after hours call:  ?Caller states that she would like to set up an appointment with Dr. Larose Kells to set up her ?appointment for her injection. She has been in [facility] for 6 months but they wouldn't do it in [place]. ?She wants to schedule for after 05/14. ? ?Will call to schedule.  ?

## 2021-09-22 NOTE — Telephone Encounter (Signed)
Left vm to return call.  ? ?Out-of-pocket cost due at time of visit: $301 ?

## 2021-09-22 NOTE — Telephone Encounter (Signed)
Will send to [place] lives 1/2 the year in [place] and normally gets 1 Prolia injection there and 1 here.  ?

## 2021-10-05 NOTE — Telephone Encounter (Signed)
Prolia scheduled.  

## 2021-10-05 NOTE — Telephone Encounter (Signed)
Pt ready for scheduling on or after 09/08/21  Out-of-pocket cost due at time of visit: $0  Primary: Medicare Prolia co-insurance: 20% (approximately $276) Admin fee co-insurance: 20% (approximately $25)  Secondary: UHC AARP Medicare Supp Prolia co-insurance: Covers Medicare Part B co-insurance Admin fee co-insurance: Covers Medicare Part B co-insurance  Deductible:  Covered by secondary  Prior Auth: does not apply PA# Valid:   ** This summary of benefits is an estimation of the patient's out-of-pocket cost. Exact cost may vary based on individual plan coverage.

## 2021-10-17 ENCOUNTER — Encounter: Payer: Self-pay | Admitting: Internal Medicine

## 2021-10-18 ENCOUNTER — Ambulatory Visit: Payer: Medicare Other

## 2021-10-20 ENCOUNTER — Encounter: Payer: Self-pay | Admitting: Internal Medicine

## 2021-10-20 ENCOUNTER — Ambulatory Visit (INDEPENDENT_AMBULATORY_CARE_PROVIDER_SITE_OTHER): Payer: Medicare Other | Admitting: Internal Medicine

## 2021-10-20 VITALS — BP 138/64 | HR 70 | Temp 97.8°F | Resp 16 | Ht 64.0 in | Wt 161.2 lb

## 2021-10-20 DIAGNOSIS — E038 Other specified hypothyroidism: Secondary | ICD-10-CM

## 2021-10-20 DIAGNOSIS — R399 Unspecified symptoms and signs involving the genitourinary system: Secondary | ICD-10-CM

## 2021-10-20 DIAGNOSIS — R42 Dizziness and giddiness: Secondary | ICD-10-CM

## 2021-10-20 DIAGNOSIS — D500 Iron deficiency anemia secondary to blood loss (chronic): Secondary | ICD-10-CM

## 2021-10-20 DIAGNOSIS — E785 Hyperlipidemia, unspecified: Secondary | ICD-10-CM

## 2021-10-20 DIAGNOSIS — I10 Essential (primary) hypertension: Secondary | ICD-10-CM | POA: Diagnosis not present

## 2021-10-20 DIAGNOSIS — R55 Syncope and collapse: Secondary | ICD-10-CM

## 2021-10-20 LAB — URINALYSIS, ROUTINE W REFLEX MICROSCOPIC
Bilirubin Urine: NEGATIVE
Hgb urine dipstick: NEGATIVE
Ketones, ur: NEGATIVE
Leukocytes,Ua: NEGATIVE
Nitrite: NEGATIVE
Specific Gravity, Urine: 1.025 (ref 1.000–1.030)
Total Protein, Urine: NEGATIVE
Urine Glucose: NEGATIVE
Urobilinogen, UA: 0.2 (ref 0.0–1.0)
pH: 5.5 (ref 5.0–8.0)

## 2021-10-20 LAB — BASIC METABOLIC PANEL
BUN: 25 mg/dL — ABNORMAL HIGH (ref 6–23)
CO2: 28 mEq/L (ref 19–32)
Calcium: 9.7 mg/dL (ref 8.4–10.5)
Chloride: 106 mEq/L (ref 96–112)
Creatinine, Ser: 0.93 mg/dL (ref 0.40–1.20)
GFR: 58.06 mL/min — ABNORMAL LOW (ref >60.00)
Glucose, Bld: 129 mg/dL — ABNORMAL HIGH (ref 70–99)
Potassium: 4.4 mEq/L (ref 3.5–5.1)
Sodium: 143 mEq/L (ref 135–145)

## 2021-10-20 LAB — LIPID PANEL
Cholesterol: 112 mg/dL (ref 0–200)
HDL: 43.3 mg/dL (ref >39.00)
LDL Cholesterol: 51 mg/dL (ref 0–99)
NonHDL: 69.16
Total CHOL/HDL Ratio: 3
Triglycerides: 89 mg/dL (ref 0.0–149.0)
VLDL: 17.8 mg/dL (ref 0.0–40.0)

## 2021-10-20 LAB — CBC WITH DIFFERENTIAL/PLATELET
Basophils Absolute: 0 10*3/uL (ref 0.0–0.1)
Basophils Relative: 0.6 % (ref 0.0–3.0)
Eosinophils Absolute: 0 10*3/uL (ref 0.0–0.7)
Eosinophils Relative: 0 % (ref 0.0–5.0)
HCT: 32.6 % — ABNORMAL LOW (ref 36.0–46.0)
Hemoglobin: 10.8 g/dL — ABNORMAL LOW (ref 12.0–15.0)
Lymphocytes Relative: 38.3 % (ref 12.0–46.0)
Lymphs Abs: 2 10*3/uL (ref 0.7–4.0)
MCHC: 33.2 g/dL (ref 30.0–36.0)
MCV: 96.3 fl (ref 78.0–100.0)
Monocytes Absolute: 0.5 10*3/uL (ref 0.1–1.0)
Monocytes Relative: 9.9 % (ref 3.0–12.0)
Neutro Abs: 2.6 10*3/uL (ref 1.4–7.7)
Neutrophils Relative %: 51.2 % (ref 43.0–77.0)
Platelets: 108 10*3/uL — ABNORMAL LOW (ref 150.0–400.0)
RBC: 3.38 Mil/uL — ABNORMAL LOW (ref 3.87–5.11)
RDW: 14.4 % (ref 11.5–15.5)
WBC: 5.2 10*3/uL (ref 4.0–10.5)

## 2021-10-20 LAB — MAGNESIUM: Magnesium: 1.5 mg/dL (ref 1.5–2.5)

## 2021-10-20 MED ORDER — LEVOTHYROXINE SODIUM 112 MCG PO TABS: 112.0000 ug | ORAL_TABLET | Freq: Every day | ORAL | 1 refills | Status: DC

## 2021-10-20 NOTE — Patient Instructions (Addendum)
Please reach out to your endocrinologist today and discussed your blood sugars with her.  We will call you soon to schedule your Prolia.   Check the  blood pressure regularly BP GOAL is between 110/65 and  135/85. If it is consistently higher or lower, let me know  Change levothyroxine to 112 mg every day.  Take it on an empty stomach away from other medications.  We will recheck your levels when you come back in 2 months   GO TO THE LAB : Get the blood work     [place], [place] back for a checkup in 2 months

## 2021-10-20 NOTE — Progress Notes (Unsigned)
Subjective:    Patient ID: Sharon Armstrong, female    DOB: 1941-04-08, 81 y.o.   MRN: [id number]  DOS:  10/20/2021 Type of visit - description: hospital f/u  Went to the ER 10/14/2021: Presented with dizziness and generalized weakness.  Also left arm numbness (not a new issue) and "Near syncope". No LOC. Vital signs were stable, orthostatic VS negative Was admitted briefly to the hospital for observation, work-up included:  Echo 10/15/2021: LVH, EF 60 to 65%.  Prolonged LV relaxation.  S/p TVAR with no paravalvular regurgitation.  CMP showed glucose of 188, AST 41 slightly elevated.  Magnesium low.   Hemoglobin 11.5, platelets 109 (low, not new). TSH 5.5 elevated.  Was discharged on Mg tablets, has more 2 more tablets to take. Also on a Zio patch   Review of Systems Since she left the hospital is doing better. Denies dizziness or LOC. No diplopia, no slurred speech or motor deficit No chest pain or palpitations. Her blood sugars are "all over the place"  Past Medical History:  Diagnosis Date   Adenomatous polyps 11/1998   Anemia    Anxiety    Aortic valve insufficiency    mild   Breast cancer ([place]) 04/2007   s/p XRT (last 4/09)   Cervical myelopathy (HCC)    Cervical stenosis of spine    sees neuro surgery and dr.ramons s/p local injection 4/09   Chronic constipation    Chronic pancreatitis ([place]) 09/29/2014   Colon polyps 02/2010   Tubular  Adenomas                                      Diabetes mellitus    Difficulty in urination    pt uses cath at times   Diverticulosis of colon    Elevated liver function tests    fatty liver per CT 2009   Fatty liver    Full body hives    if patient does not take Fexofenadine   Gastroparesis 10/2015   gastric emptying abnormal for 4 hours   GERD (gastroesophageal reflux disease)    Hemorrhoids    Hyperlipemia    Hypertension    Hypothyroidism    Insomnia    Lumbar spondylosis    Lumbar epidural steroid injection L5-S1 to  the left, Dr. Nelva Bush   Mitral valve regurgitation    Osteoarthritis    Personal history of radiation therapy 2009   Positive PPD    never treated father died TB   Restless leg syndrome    Urticaria 06/2009    Past Surgical History:  Procedure Laterality Date   ANTERIOR CERVICAL DECOMP/DISCECTOMY FUSION N/A 01/08/2013   Procedure: Cervical three-four, four-five, five-six anterior cervical decompression with fusion plating and bonegraft;  Surgeon: Eustace Moore, MD;  Location: [facility];  Service: Neurosurgery;  Laterality: N/A;  Cervical three-four, four-five, five-six anterior cervical decompression with fusion plating and bonegraft   ANTERIOR CERVICAL DECOMP/DISCECTOMY FUSION N/A 03/25/2020   Procedure: CERVICAL TWO-THREE ANTERIOR CERVICAL DECOMPRESSION/DISCECTOMY FUSION WITH REMOVAL OF PLATE;  Surgeon: Eustace Moore, MD;  Location: [facility];  Service: Neurosurgery;  Laterality: N/A;  3C   APPENDECTOMY     BREAST LUMPECTOMY Left 2009   left breast   CARDIAC CATHETERIZATION  2001   CATARACT EXTRACTION, BILATERAL  2/11   EYE SURGERY     GANGLION CYST EXCISION Right    wrist   sinus  cyst removed     TRANSCATHETER AORTIC VALVE REPLACEMENT, TRANSAORTIC  01/09/2019   UTERINE FIBROID SURGERY      Current Outpatient Medications  Medication Instructions   ACCU-CHEK GUIDE test strip USE TO TEST BLOOD SUGAR TWICE DAILY- DX CODE E11.9   amLODipine (NORVASC) 5 mg, Oral, Daily   aspirin EC 81 mg, Oral, Daily   Cyanocobalamin 1,000 mcg, Injection, See admin instructions, Inject 1043mg IM every month and a half.   denosumab (PROLIA) 60 mg, Subcutaneous, Every 6 months   HYDROcodone-acetaminophen (NORCO) 10-325 MG tablet 1 tablet, Oral, Every  3 hours PRN   insulin aspart (NOVOLOG FLEXPEN) 100 UNIT/ML FlexPen INJECT 5-7 UNITS BEFORE DINNER AND LARGER MEALS   Insulin Pen Needle 31G X 6 MM MISC Use to inject insulin up to 4 times daily.   LANTUS SOLOSTAR 100 UNIT/ML Solostar Pen INJECT 35 UNITS  SUBCUTANEOUSLY ONCE DAILY   levothyroxine (SYNTHROID) 112 mcg, Oral, Daily before breakfast   Lidocaine 4 % PTCH Apply externally   lisinopril (ZESTRIL) 40 mg, Oral, Daily   magnesium oxide (MAG-OX) 400 MG tablet 1 tablet, Oral, Daily   metFORMIN (GLUCOPHAGE) 1,000 mg, Oral, 2 times daily with meals   MILK THISTLE PLUS PO 450 mg, Oral, 2 times daily   ondansetron (ZOFRAN-ODT) 4 mg, Oral, Every 8 hours PRN   pramipexole (MIRAPEX) 0.25 mg, Oral, Daily at bedtime, Take 1-1/2 to 2 hours before projected bedtime.   rosuvastatin (CRESTOR) 5 mg, Oral, Daily   solifenacin (VESICARE) 5 mg, Oral, Daily   traZODone (DESYREL) 75-100 mg, Oral, At bedtime PRN   triamterene-hydrochlorothiazide (MAXZIDE-25) 37.5-25 MG tablet 1 tablet, Oral, Daily       Objective:   Physical Exam BP 138/64   Pulse 70   Temp 97.8 F (36.6 C) (Oral)   Resp 16   Ht '5\' 4"'$  (1.626 m)   Wt 161 lb 4 oz (73.1 kg)   SpO2 98%   BMI 27.68 kg/m  General:   Well developed, NAD, BMI noted. HEENT:  Normocephalic . Face symmetric, atraumatic Lungs:  CTA B Normal respiratory effort, no intercostal retractions, no accessory muscle use. Heart: RRR, soft systolic murmur.  Lower extremities: no pretibial edema bilaterally  Skin: Not pale. Not jaundice Neurologic:  alert & oriented X3.  Speech normal, gait appropriate for age and unassisted Psych--  Cognition and judgment appear intact.  Cooperative with normal attention span and concentration.  Behavior appropriate. No anxious or depressed appearing.      Assessment     ASSESSMENT DM per endo Polyneuropathy per [facility] 11-2018 HTN Hyperlipidemia  Hypothyroidism Anxiety , insomnia CV: - [place] empiric B12 shot 12-2014) RLS  MSK: on ultram per pain mngmt --Spinal cervical stenosis, local injections, surgery 12-2012 --Low back pain-- Dr RNelva Bush--DJD  --osteoporosis see phone note 01/2016  2010:  T score -1.5 (femur)  2015: T score -1.9 (femur) 02/08/2016: T score -1.6 (femur)  04/09/2018: T score -1.3. Started Prolia approximately 05-2011 GI: used to see Dr SFuller Plan last OV 10-2014 , now Dr FRoney Mans --GERD, --Chronic pancreatitis, h/o episodes of acute pancreatitis -- Liver cirrhosis per MRI 12-2019 --Chronic constipation --Nausea, vomiting, eval in [place]~ 12- 2016: Delayed gastric emptying, EGD biopsy no H. Pylori,SIBO +, Rx ABX Breast cancer dx 2008 XRT H/o +PPD H/o Urticaria H/ o Mild aortic valve insufficiency   PLAN: Hospital follow-up, last seen by me October 2022 Dizziness, presyncope: Admitted  to the hospital, work-up essentially negative except for low magnesium, she is currently taking magnesium supplements.  Was discharged on a Zio patch. We will check a BMP, magnesium level. Has an appointment to see cardiology next week. DM: Reports blood sugars range from 65-226; saw endocrinology recently in [place], A1c 2 weeks ago was 7.1 per pt.   Strongly encouraged to communicate with endo  for further advice regards to DM management.  She states she will. HTN: BP today looks good, continue lisinopril, Maxide.  Checking labs Hyperlipidemia: On Crestor, check FLP. Hypothyroidism: Last TSH is slightly elevated, change levothyroxine from 100 mcg to 112 mcg, recheck in 2 months. LUTS: Also reports mild dysuria without gross hematuria for the last week.  Check UA urine culture. Anemia, GI: Rechecking a CBC today, reports she is coordinating a endoscopy w/ GI (Dr. Roney Mans). RTC 2 months

## 2021-10-21 LAB — URINE CULTURE
MICRO NUMBER:: 13476305
SPECIMEN QUALITY:: ADEQUATE

## 2021-10-21 NOTE — Assessment & Plan Note (Signed)
Hospital follow-up, last seen by me October 2022 Dizziness, presyncope: Admitted to the hospital, work-up essentially negative except for low magnesium, she is currently taking magnesium supplements.  Was discharged on a Zio patch. We will check a BMP, magnesium level. Has an appointment to see cardiology next week. DM: Reports blood sugars range from 65-226; saw endocrinology recently in [place], A1c 2 weeks ago was 7.1 per pt.   Strongly encouraged to communicate with endo  for further advice regards to DM management.  She states she will. HTN: BP today looks good, continue lisinopril, Maxide.  Checking labs Hyperlipidemia: On Crestor, check FLP. Hypothyroidism: Last TSH is slightly elevated, change levothyroxine from 100 mcg to 112 mcg, recheck in 2 months. LUTS: Also reports mild dysuria without gross hematuria for the last week.  Check UA urine culture. Anemia, GI: Rechecking a CBC today, reports she is coordinating a endoscopy w/ GI (Dr. Roney Mans). RTC 2 months

## 2021-11-07 ENCOUNTER — Telehealth: Payer: Self-pay

## 2021-11-07 NOTE — Telephone Encounter (Signed)
Recommend good hydration and take over-the-counter cranberry supplements for few days.  That is likely to help.

## 2021-11-07 NOTE — Telephone Encounter (Signed)
Pt called about her urine culture stating that she is struggling a little still. Culture was negative.

## 2021-11-08 NOTE — Telephone Encounter (Signed)
Yes, last creatinine okay.

## 2021-11-08 NOTE — Telephone Encounter (Signed)
Is her renal function stable enough for her Prolia?

## 2021-11-08 NOTE — Telephone Encounter (Signed)
Spoke w/ Pt- informed of recommendations. Pt verbalized understanding. Prolia scheduled for tomorrow.

## 2021-11-09 ENCOUNTER — Ambulatory Visit (INDEPENDENT_AMBULATORY_CARE_PROVIDER_SITE_OTHER): Payer: Medicare Other

## 2021-11-09 DIAGNOSIS — M818 Other osteoporosis without current pathological fracture: Secondary | ICD-10-CM | POA: Diagnosis not present

## 2021-11-09 MED ORDER — DENOSUMAB 60 MG/ML ~~LOC~~ SOSY
60.0000 mg | PREFILLED_SYRINGE | Freq: Once | SUBCUTANEOUS | Status: AC
  Administered 2021-11-09: 60 mg via SUBCUTANEOUS

## 2021-11-24 ENCOUNTER — Ambulatory Visit (INDEPENDENT_AMBULATORY_CARE_PROVIDER_SITE_OTHER): Payer: Medicare Other | Admitting: Family Medicine

## 2021-11-24 ENCOUNTER — Encounter: Payer: Self-pay | Admitting: Family Medicine

## 2021-11-24 VITALS — BP 160/70 | HR 77 | Temp 98.2°F | Resp 18 | Ht 64.0 in | Wt 163.8 lb

## 2021-11-24 DIAGNOSIS — J0140 Acute pansinusitis, unspecified: Secondary | ICD-10-CM

## 2021-11-24 MED ORDER — FLUTICASONE PROPIONATE 50 MCG/ACT NA SUSP: 2.0000 | Freq: Every day | NASAL | 6 refills | Status: DC

## 2021-11-24 MED ORDER — LEVOCETIRIZINE DIHYDROCHLORIDE 5 MG PO TABS: 5.0000 mg | ORAL_TABLET | Freq: Every evening | ORAL | 5 refills | Status: DC

## 2021-11-24 MED ORDER — AMOXICILLIN-POT CLAVULANATE 875-125 MG PO TABS: 1.0000 | ORAL_TABLET | Freq: Two times a day (BID) | ORAL | 0 refills | Status: DC

## 2021-11-24 NOTE — Assessment & Plan Note (Signed)
abx per orders  flonase and xyzal  Call or rto prn

## 2021-11-24 NOTE — Progress Notes (Signed)
Established Patient Office Visit  Subjective   Patient ID: Sharon Armstrong, female    DOB: [birth year]  Age: 81 y.o. MRN: [id number]  Chief Complaint  Patient presents with   Sinus Problem    Pt states x5 months. Pt states was treated for sinus in [place] and states her sxs haven't gone away    HPI Pt her c/o sinus symptoms on and off x 5 months   it started in [place] and was treated and got better but then the symptoms came back and worsened when she got back here.   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   Aortic valve stenosis, nonrheumatic 11/12/2018   PCP NOTES >>> 03/08/2015   Loss of weight 01/05/2015   Chronic pancreatitis ([place]) 09/29/2014   Leg cramps 12/08/2013   Insomnia 06/16/2013   Annual physical exam 01/25/2011   Osteoporosis 03/22/2009   Dyslipidemia 03/12/2008   Anxiety state 11/20/2007   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/20/2007   ADENOCARCINOMA, BREAST 11/05/2007   CHEST PAIN 11/05/2007   Hypothyroidism 11/11/2006   DM II (diabetes mellitus, type II), controlled ([place]) 11/11/2006   RESTLESS LEG SYNDROME 11/11/2006   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   Essential hypertension 11/11/2006   DIVERTICULOSIS, COLON 11/11/2006   Osteoarthritis 11/11/2006   Chronic fatigue 11/11/2006   Fatty liver 11/11/2006   Past Medical History:  Diagnosis Date   Adenomatous polyps 11/1998   Anemia    Anxiety    Aortic valve insufficiency    mild   Breast cancer ([place]) 04/2007   s/p XRT (last 4/09)   Cervical myelopathy (HCC)    Cervical stenosis of spine    sees neuro surgery and dr.ramons s/p local injection 4/09   Chronic constipation    Chronic pancreatitis ([place]) 09/29/2014   Colon polyps 02/2010   Tubular   Adenomas                                      Diabetes mellitus    Difficulty in urination    pt uses cath at times   Diverticulosis of colon    Elevated liver function tests    fatty liver per CT 2009   Fatty liver    Full body hives    if patient does not take Fexofenadine   Gastroparesis 10/2015   gastric emptying abnormal for 4 hours   GERD (gastroesophageal reflux disease)    Hemorrhoids    Hyperlipemia    Hypertension    Hypothyroidism    Insomnia    Lumbar spondylosis    Lumbar epidural steroid injection L5-S1 to the left, Dr. Nelva Bush   Mitral valve regurgitation    Osteoarthritis    Personal history of radiation therapy 2009   Positive PPD    never treated father died TB   Restless leg syndrome    Urticaria 06/2009   Past Surgical History:  Procedure Laterality Date   ANTERIOR CERVICAL DECOMP/DISCECTOMY FUSION N/A 01/08/2013   Procedure: Cervical three-four, four-five, five-six anterior cervical decompression with fusion plating and bonegraft;  Surgeon: Eustace Moore, MD;  Location: [facility];  Service: Neurosurgery;  Laterality: N/A;  Cervical  three-four, four-five, five-six anterior cervical decompression with fusion plating and bonegraft   ANTERIOR CERVICAL DECOMP/DISCECTOMY FUSION N/A 03/25/2020   Procedure: CERVICAL TWO-THREE ANTERIOR CERVICAL DECOMPRESSION/DISCECTOMY FUSION WITH REMOVAL OF PLATE;  Surgeon: Eustace Moore, MD;  Location: [facility];  Service: Neurosurgery;  Laterality: N/A;  3C   APPENDECTOMY     BREAST LUMPECTOMY Left 2009   left breast   CARDIAC CATHETERIZATION  2001   CATARACT EXTRACTION, BILATERAL  2/11   EYE SURGERY     GANGLION CYST EXCISION Right    wrist   sinus cyst removed     TRANSCATHETER AORTIC VALVE REPLACEMENT, TRANSAORTIC  01/09/2019   UTERINE FIBROID SURGERY     Social History   Tobacco Use   Smoking status: Never   Smokeless tobacco: Never  Vaping Use   Vaping Use: Never used  Substance Use Topics   Alcohol use: Yes     Alcohol/week: 0.0 standard drinks of alcohol    Comment: rare   Drug use: No   Social History   Socioeconomic History   Marital status: Married    Spouse name: Not on file   Number of children: 2   Years of education: Not on file   Highest education level: Not on file  Occupational History   Occupation: Advertising account executive  Tobacco Use   Smoking status: Never   Smokeless tobacco: Never  Vaping Use   Vaping Use: Never used  Substance and Sexual Activity   Alcohol use: Yes    Alcohol/week: 0.0 standard drinks of alcohol    Comment: rare   Drug use: No   Sexual activity: Not on file  Other Topics Concern   Not on file  Social History Narrative   Lives in [place] half of the time       Social Determinants of Health   Financial Resource Strain: Not on file  Food Insecurity: Not on file  Transportation Needs: Not on file  Physical Activity: Not on file  Stress: Not on file  Social Connections: Not on file  Intimate Partner Violence: Not on file   Family Status  Relation Name Status   Mother  (Not Specified)   Father  (Not Specified)   Neg Hx  (Not Specified)   Family History  Problem Relation Age of Onset   Hypertension Mother    Lung cancer Mother        mets to brain   Brain cancer Mother    Tuberculosis Father        died at 26   Coronary artery disease Neg Hx    Diabetes Neg Hx    Stroke Neg Hx    Colon cancer Neg Hx    Breast cancer Neg Hx    Allergies  Allergen Reactions   Ciprofloxacin Hives and Swelling   Gabapentin Swelling    Edema, legs    Iodine Hives   Iohexol    Nsaids Other (See Comments)    REACTION: hallucinations Tolerates Meloxicam    Spironolactone     Elevates potassium   Tolmetin Other (See Comments)    REACTION: hallucinations   Aciphex [Rabeprazole] Rash   Ioxaglate Rash and Hives   Ivp Dye [Iodinated Contrast Media] Hives and Rash   Nexium [Esomeprazole Magnesium] Rash   Pepcid [Famotidine] Rash   Protonix [Pantoprazole  Sodium] Rash      Review of Systems  Constitutional:  Negative for fever and malaise/fatigue.  HENT:  Positive for congestion and sinus pain.   Eyes:  Negative for blurred vision.  Respiratory:  Negative for cough and shortness of breath.   Cardiovascular:  Negative for chest pain, palpitations and leg swelling.  Gastrointestinal:  Negative for abdominal pain, blood in stool and nausea.  Genitourinary:  Negative for dysuria and frequency.  Musculoskeletal:  Negative for falls.  Skin:  Negative for rash.  Neurological:  Positive for headaches. Negative for dizziness and loss of consciousness.  Endo/Heme/Allergies:  Negative for environmental allergies.  Psychiatric/Behavioral:  Negative for depression. The patient is not nervous/anxious.       Objective:     BP (!) 160/70 (BP Location: Left Arm, Patient Position: Sitting, Cuff Size: Normal)   Pulse 77   Temp 98.2 F (36.8 C) (Oral)   Resp 18   Ht '5\' 4"'$  (1.626 m)   Wt 163 lb 12.8 oz (74.3 kg)   SpO2 98%   BMI 28.12 kg/m  BP Readings from Last 3 Encounters:  11/24/21 (!) 160/70  10/20/21 138/64  03/07/21 122/84   Wt Readings from Last 3 Encounters:  11/24/21 163 lb 12.8 oz (74.3 kg)  10/20/21 161 lb 4 oz (73.1 kg)  03/07/21 150 lb 4 oz (68.2 kg)   SpO2 Readings from Last 3 Encounters:  11/24/21 98%  10/20/21 98%  12/14/20 98%      Physical Exam Vitals and nursing note reviewed.  Constitutional:      Appearance: She is not diaphoretic.  HENT:     Nose: Mucosal edema, congestion and rhinorrhea present. No nasal deformity.     Right Sinus: Maxillary sinus tenderness and frontal sinus tenderness present.     Left Sinus: Maxillary sinus tenderness and frontal sinus tenderness present.     Mouth/Throat:     Pharynx: No oropharyngeal exudate.  Cardiovascular:     Rate and Rhythm: Normal rate and regular rhythm.     Heart sounds: Normal heart sounds. No murmur heard. Pulmonary:     Effort: Pulmonary effort is  normal. No respiratory distress.     Breath sounds: Normal breath sounds. No rales.  Musculoskeletal:     Cervical back: Normal range of motion and neck supple.  Lymphadenopathy:     Cervical: No cervical adenopathy.  Skin:    General: Skin is warm.  Neurological:     Mental Status: She is alert and oriented to person, place, and time.      No results found for any visits on 11/24/21.  Last CBC Lab Results  Component Value Date   WBC 5.2 10/20/2021   HGB 10.8 (L) 10/20/2021   HCT 32.6 (L) 10/20/2021   MCV 96.3 10/20/2021   MCH 29.9 03/23/2020   RDW 14.4 10/20/2021   PLT 108.0 (L) 26/83/4196   Last metabolic panel Lab Results  Component Value Date   GLUCOSE 129 (H) 10/20/2021   NA 143 10/20/2021   K 4.4 10/20/2021   CL 106 10/20/2021   CO2 28 10/20/2021   BUN 25 (H) 10/20/2021   CREATININE 0.93 10/20/2021   GFRNONAA 60 (L) 03/23/2020   CALCIUM 9.7 10/20/2021   PROT 7.3 12/14/2020   ALBUMIN 4.1 12/14/2020   BILITOT 0.6 12/14/2020   ALKPHOS 37 (L) 12/14/2020   AST 33 12/14/2020   ALT 9 12/14/2020   ANIONGAP 15 03/23/2020   Last lipids Lab Results  Component Value Date   CHOL 112 10/20/2021   HDL 43.30 10/20/2021   LDLCALC 51 10/20/2021   TRIG 89.0 10/20/2021   CHOLHDL 3 10/20/2021   Last hemoglobin A1c Lab  Results  Component Value Date   HGBA1C 7.6 08/31/2021   Last thyroid functions Lab Results  Component Value Date   TSH 1.23 12/14/2020   Last vitamin D Lab Results  Component Value Date   VD25OH 58.53 09/07/2016   Last vitamin B12 and Folate Lab Results  Component Value Date   VITAMINB12 >2000 (H) 11/02/2020   FOLATE 14.6 11/02/2020      The ASCVD Risk score (Arnett DK, et al., 2019) failed to calculate for the following reasons:   The 2019 ASCVD risk score is only valid for ages 42 to 22    Assessment & Plan:   Problem List Items Addressed This Visit       Unprioritized   Acute non-recurrent pansinusitis - Primary    abx per  orders  flonase and xyzal  Call or rto prn       Relevant Medications   amoxicillin-clavulanate (AUGMENTIN) 875-125 MG tablet   fluticasone (FLONASE) 50 MCG/ACT nasal spray   levocetirizine (XYZAL) 5 MG tablet    Return if symptoms worsen or fail to improve.    Ann Held, DO

## 2021-11-24 NOTE — Patient Instructions (Signed)

## 2021-11-30 DIAGNOSIS — D490 Neoplasm of unspecified behavior of digestive system: Secondary | ICD-10-CM | POA: Insufficient documentation

## 2021-12-20 ENCOUNTER — Ambulatory Visit: Payer: Medicare Other | Admitting: Internal Medicine

## 2022-02-01 ENCOUNTER — Telehealth: Payer: Self-pay | Admitting: Internal Medicine

## 2022-02-01 NOTE — Telephone Encounter (Signed)
LMOM informing that typically mammograms end at age 81 however she can continue if she likes. She can discuss this at her visit w/ PCP on 02/09/22.

## 2022-02-01 NOTE — Telephone Encounter (Signed)
Patient states she believes she is overdue for a mammogram, she would like a call to know where she needs to go for one. Please advise.

## 2022-02-09 ENCOUNTER — Encounter: Payer: Self-pay | Admitting: Internal Medicine

## 2022-02-09 ENCOUNTER — Ambulatory Visit (INDEPENDENT_AMBULATORY_CARE_PROVIDER_SITE_OTHER): Payer: Medicare Other | Admitting: Internal Medicine

## 2022-02-09 VITALS — BP 126/66 | HR 71 | Temp 98.1°F | Resp 18 | Ht 64.0 in | Wt 161.0 lb

## 2022-02-09 DIAGNOSIS — M818 Other osteoporosis without current pathological fracture: Secondary | ICD-10-CM | POA: Diagnosis not present

## 2022-02-09 DIAGNOSIS — E038 Other specified hypothyroidism: Secondary | ICD-10-CM | POA: Diagnosis not present

## 2022-02-09 DIAGNOSIS — E119 Type 2 diabetes mellitus without complications: Secondary | ICD-10-CM

## 2022-02-09 DIAGNOSIS — Z1231 Encounter for screening mammogram for malignant neoplasm of breast: Secondary | ICD-10-CM | POA: Diagnosis not present

## 2022-02-09 DIAGNOSIS — Z23 Encounter for immunization: Secondary | ICD-10-CM | POA: Diagnosis not present

## 2022-02-09 NOTE — Patient Instructions (Addendum)
Vaccines I recommend:  Shingrix (shingles) RSV vaccine COVID-vaccine  We are referring you to Dr. Dwyane Dee for your diabetes management  Check the  blood pressure regularly BP GOAL is between 110/65 and  135/85. If it is consistently higher or lower, let me know    GO TO THE LAB : Get the blood work     [place] back for   a checkup in 4 to 5 months

## 2022-02-09 NOTE — Progress Notes (Unsigned)
Subjective:    Patient ID: Sharon Armstrong, female    DOB: 1941/05/11, 81 y.o.   MRN: [id number]  DOS:  02/09/2022 Type of visit - description: Follow-up  Here for management of thyroid disease. Good compliance with medication. We also talk about other chronic medical issues.   Review of Systems See above   Past Medical History:  Diagnosis Date   Adenomatous polyps 11/1998   Anemia    Anxiety    Aortic valve insufficiency    mild   Breast cancer ([facility]) 04/2007   s/p XRT (last 4/09)   Cervical myelopathy ([facility])    Cervical stenosis of spine    sees neuro surgery and dr.ramons s/p local injection 4/09   Chronic constipation    Chronic pancreatitis ([facility]) 09/29/2014   Colon polyps 02/2010   Tubular  Adenomas                                      Diabetes mellitus    Difficulty in urination    pt uses cath at times   Diverticulosis of colon    Elevated liver function tests    fatty liver per CT 2009   Fatty liver    Full body hives    if patient does not take Fexofenadine   Gastroparesis 10/2015   gastric emptying abnormal for 4 hours   GERD (gastroesophageal reflux disease)    Hemorrhoids    Hyperlipemia    Hypertension    Hypothyroidism    Insomnia    Lumbar spondylosis    Lumbar epidural steroid injection L5-S1 to the left, Dr. Nelva Bush   Mitral valve regurgitation    Osteoarthritis    Personal history of radiation therapy 2009   Positive PPD    never treated father died TB   Restless leg syndrome    Urticaria 06/2009    Past Surgical History:  Procedure Laterality Date   ANTERIOR CERVICAL DECOMP/DISCECTOMY FUSION N/A 01/08/2013   Procedure: Cervical three-four, four-five, five-six anterior cervical decompression with fusion plating and bonegraft;  Surgeon: Eustace Moore, MD;  Location: [facility];  Service: Neurosurgery;  Laterality: N/A;  Cervical three-four, four-five, five-six anterior cervical decompression with fusion plating and bonegraft   ANTERIOR  CERVICAL DECOMP/DISCECTOMY FUSION N/A 03/25/2020   Procedure: CERVICAL TWO-THREE ANTERIOR CERVICAL DECOMPRESSION/DISCECTOMY FUSION WITH REMOVAL OF PLATE;  Surgeon: Eustace Moore, MD;  Location: [facility];  Service: Neurosurgery;  Laterality: N/A;  3C   APPENDECTOMY     BREAST LUMPECTOMY Left 2009   left breast   CARDIAC CATHETERIZATION  2001   CATARACT EXTRACTION, BILATERAL  2/11   EYE SURGERY     GANGLION CYST EXCISION Right    wrist   sinus cyst removed     TRANSCATHETER AORTIC VALVE REPLACEMENT, TRANSAORTIC  01/09/2019   UTERINE FIBROID SURGERY      Current Outpatient Medications  Medication Instructions   ACCU-CHEK GUIDE test strip USE TO TEST BLOOD SUGAR TWICE DAILY- DX CODE E11.9   amLODipine (NORVASC) 5 mg, Oral, Daily   amoxicillin-clavulanate (AUGMENTIN) 875-125 MG tablet 1 tablet, Oral, 2 times daily   aspirin EC 81 mg, Oral, Daily   Cyanocobalamin 1,000 mcg, Injection, See admin instructions, Inject 1088mg IM every month and a half.   denosumab (PROLIA) 60 mg, Subcutaneous, Every 6 months   fluticasone (FLONASE) 50 MCG/ACT nasal spray 2 sprays, Each Nare, Daily   furosemide (  LASIX) 20 MG tablet 1 tablet, Oral, Daily   HYDROcodone-acetaminophen (NORCO) 10-325 MG tablet 1 tablet, Oral, Every  3 hours PRN   insulin aspart (NOVOLOG FLEXPEN) 100 UNIT/ML FlexPen INJECT 5-7 UNITS BEFORE DINNER AND LARGER MEALS   Insulin Pen Needle 31G X 6 MM MISC Use to inject insulin up to 4 times daily.   LANTUS SOLOSTAR 100 UNIT/ML Solostar Pen INJECT 35 UNITS SUBCUTANEOUSLY ONCE DAILY   levocetirizine (XYZAL) 5 mg, Oral, Every evening   levothyroxine (SYNTHROID) 112 mcg, Oral, Daily before breakfast   Lidocaine 4 % PTCH Apply externally   lisinopril (ZESTRIL) 40 mg, Oral, Daily   metFORMIN (GLUCOPHAGE) 1,000 mg, Oral, 2 times daily with meals   MILK THISTLE PLUS PO 450 mg, Oral, 2 times daily   ondansetron (ZOFRAN-ODT) 4 mg, Oral, Every 8 hours PRN   pramipexole (MIRAPEX) 0.25 mg, Oral, Daily  at bedtime, Take 1-1/2 to 2 hours before projected bedtime.   rosuvastatin (CRESTOR) 5 mg, Oral, Daily   solifenacin (VESICARE) 5 mg, Oral, Daily   traZODone (DESYREL) 75-100 mg, Oral, At bedtime PRN   triamterene-hydrochlorothiazide (MAXZIDE-25) 37.5-25 MG tablet 1 tablet, Daily       Objective:   Physical Exam BP 126/66   Pulse 71   Temp 98.1 F (36.7 C) (Oral)   Resp 18   Ht '5\' 4"'$  (1.626 m)   Wt 161 lb (73 kg)   SpO2 97%   BMI 27.64 kg/m  General:   Well developed, NAD, BMI noted. HEENT:  Normocephalic . Face symmetric, atraumatic Lungs:  CTA B Normal respiratory effort, no intercostal retractions, no accessory muscle use. Heart: RRR, soft systolic murmur.  Lower extremities: Has compression stocking, mild ankle swelling bilaterally. Skin: Not pale. Not jaundice Neurologic:  alert & oriented X3.  Speech normal, gait appropriate for age and unassisted Psych--  Cognition and judgment appear intact.  Cooperative with normal attention span and concentration.  Behavior appropriate. No anxious or depressed appearing.      Assessment     ASSESSMENT DM per endo Polyneuropathy per [facility] 11-2018 HTN Hyperlipidemia  Hypothyroidism Anxiety , insomnia CV: - S/p Tvar 12/2018 @ [facility] - Carotid artery Dz dx in [place] Chronic fatigue (rx empiric B12 shot 12-2014) RLS  MSK: on ultram per pain mngmt --Spinal cervical stenosis, local injections, surgery 12-2012 --Low back pain-- Dr Nelva Bush --DJD  --osteoporosis see phone note 01/2016  2010: T score -1.5 (femur)  2015: T score -1.9 (femur) 02/08/2016: T score -1.6 (femur)  04/09/2018: T score -1.3. Started Prolia approximately 05-2011 GI: used to see Dr Fuller Plan, last OV 10-2014 , now Dr Roney Mans  --GERD, --Chronic pancreatitis, h/o episodes of acute pancreatitis -- Liver cirrhosis per MRI 12-2019 --Chronic constipation --Nausea, vomiting, eval in [place]~ 12- 2016: Delayed gastric emptying, EGD biopsy no H. Pylori,SIBO +, Rx  ABX Breast cancer dx 2008 XRT H/o +PPD H/o Urticaria H/ o Mild aortic valve insufficiency   PLAN: Labs from 01/24/2022. Potassium 4.0, glucose 133, LFTs normal, creatinine 0.9.  Hemoglobin 12, platelets 114 Hypothyroidism: Here for management of hypothyroidism, at the last visit dose was adjusted, good compliance, check TSH. HTN, cardiovascular: Currently under the care of cardiology, due to swelling they are managing her diuretics, on Lasix. High cholesterol: On Crestor, last FLP very good DM: will see her endocrinologist in [place] in several weeks, referred to local endocrinologist Dr. Dwyane Dee.  Current range of CBGs 150-200. Osteoporosis: In order to get her next Prolia shot in [place] her insurance request  a bone density test.  Will arrange Preventive care: Requesting mammogram Flu shot provided Advised about COVID-vaccine: Declines RTC 4 months

## 2022-02-10 LAB — TSH: TSH: 2.98 mIU/L (ref 0.40–4.50)

## 2022-02-10 NOTE — Assessment & Plan Note (Signed)
Labs from 01/24/2022. Potassium 4.0, glucose 133, LFTs normal, creatinine 0.9.  Hemoglobin 12, platelets 114 Hypothyroidism: Here for management of hypothyroidism, at the last visit dose was adjusted, good compliance, check TSH. HTN, cardiovascular: Currently under the care of cardiology, due to swelling they are managing her diuretics, on Lasix. High cholesterol: On Crestor, last FLP very good DM: will see her endocrinologist in [place] in several weeks, referred to local endocrinologist Dr. Dwyane Dee.  Current range of CBGs 150-200. Osteoporosis: In order to get her next Prolia shot in [place] her insurance request a bone density test.  Will arrange Preventive care: Requesting mammogram Flu shot provided Advised about COVID-vaccine: Declines RTC 4 months

## 2022-02-19 ENCOUNTER — Ambulatory Visit (HOSPITAL_BASED_OUTPATIENT_CLINIC_OR_DEPARTMENT_OTHER)
Admission: RE | Admit: 2022-02-19 | Discharge: 2022-02-19 | Disposition: A | Discharge status: Home / Self Care | Payer: Medicare Other | Source: Ambulatory Visit | Attending: Internal Medicine | Admitting: Internal Medicine

## 2022-02-19 ENCOUNTER — Encounter (HOSPITAL_BASED_OUTPATIENT_CLINIC_OR_DEPARTMENT_OTHER): Payer: Self-pay

## 2022-02-19 DIAGNOSIS — Z1231 Encounter for screening mammogram for malignant neoplasm of breast: Secondary | ICD-10-CM | POA: Insufficient documentation

## 2022-02-19 DIAGNOSIS — M818 Other osteoporosis without current pathological fracture: Secondary | ICD-10-CM | POA: Insufficient documentation

## 2022-02-21 ENCOUNTER — Telehealth: Payer: Self-pay | Admitting: *Deleted

## 2022-02-21 ENCOUNTER — Telehealth: Payer: Self-pay | Admitting: Internal Medicine

## 2022-02-21 ENCOUNTER — Other Ambulatory Visit: Payer: Self-pay | Admitting: Internal Medicine

## 2022-02-21 DIAGNOSIS — D508 Other iron deficiency anemias: Secondary | ICD-10-CM

## 2022-02-21 DIAGNOSIS — R928 Other abnormal and inconclusive findings on diagnostic imaging of breast: Secondary | ICD-10-CM

## 2022-02-21 DIAGNOSIS — G2581 Restless legs syndrome: Secondary | ICD-10-CM

## 2022-02-21 DIAGNOSIS — Z8669 Personal history of other diseases of the nervous system and sense organs: Secondary | ICD-10-CM

## 2022-02-21 NOTE — Telephone Encounter (Signed)
RECOMMENDATION: Diagnostic mammogram and possibly ultrasound of the right breast. (Code:[id number])   The patient will be contacted regarding the findings, and additional imaging will be scheduled   Pt will be contacted for further imaging.

## 2022-02-21 NOTE — Telephone Encounter (Signed)
Pt called to follow up on results for MM done on 10.2.23. Advised that Dr. Larose Kells had not had a chance to look at them as there were no comments on them. Pt asked if a note could be sent back to draw attention to them as she is concerned with what was found.

## 2022-02-21 NOTE — Telephone Encounter (Signed)
I called pt Sharon Armstrong for her to return call.  Wanted to check if she is still taking the pramipexole and needs refill.  If she call you may ask her if still taking, how much ?  Pharmacy is at [place].  She needs annual f/u appt scheduled to.

## 2022-02-27 MED ORDER — PRAMIPEXOLE DIHYDROCHLORIDE 0.125 MG PO TABS: 0.1250 mg | ORAL_TABLET | Freq: Every day | ORAL | 0 refills | Status: AC

## 2022-02-27 NOTE — Telephone Encounter (Signed)
Pt called back. Stated she is still taking medication pramipexole but only taking one pill.

## 2022-02-27 NOTE — Addendum Note (Signed)
Addended by: Oliver Hum S on: 02/27/2022 03:00 PM   Modules accepted: Orders

## 2022-03-02 ENCOUNTER — Ambulatory Visit
Admission: RE | Admit: 2022-03-02 | Discharge: 2022-03-02 | Disposition: A | Discharge status: Home / Self Care | Payer: Medicare Other | Source: Ambulatory Visit | Attending: Internal Medicine | Admitting: Internal Medicine

## 2022-03-02 ENCOUNTER — Ambulatory Visit: Payer: Medicare Other

## 2022-03-02 DIAGNOSIS — R928 Other abnormal and inconclusive findings on diagnostic imaging of breast: Secondary | ICD-10-CM

## 2022-03-05 ENCOUNTER — Encounter: Payer: Self-pay | Admitting: Neurology

## 2022-03-05 ENCOUNTER — Ambulatory Visit (INDEPENDENT_AMBULATORY_CARE_PROVIDER_SITE_OTHER): Payer: Medicare Other | Admitting: Neurology

## 2022-03-05 VITALS — BP 160/68 | HR 76 | Ht 64.0 in | Wt 161.6 lb

## 2022-03-05 DIAGNOSIS — D508 Other iron deficiency anemias: Secondary | ICD-10-CM

## 2022-03-05 DIAGNOSIS — G2581 Restless legs syndrome: Secondary | ICD-10-CM

## 2022-03-05 NOTE — Patient Instructions (Addendum)
It was nice to see you again. Your swelling is possibly in part from the pramipexole. Since it is not helpful at this point, please taper off of it by taking one pill once daily in the evening for 2 weeks, then stop.   Please monitor your swelling. I would not recommend any new medication from my end of things as you have tried gabapentin before and you had been on Levodopa before. As discussed, other medications in the family of dopamine agonists (such as pramipexole) may cause swelling too.    You can try Melatonin at night for sleep: take 5 mg as needed, may increase to 10 mg if needed.  Please take about an hour or 2 before bedtime.  It is over the counter and comes in pill form, chewable form and spray, if you prefer.     Anemia can make your restless legs worse, I recommend you continue with anemia treatment and follow up with your specialists.   Restless leg symptoms can be induced are aggravated by certain conditions including thyroid dysfunction and anemia or iron deficiency. Certain medications can induce leg twitching at night, which we call periodic leg movements and disrupt sleep and also exacerbate restless leg symptoms during wakefulness. These medications often include antidepressants.   Please follow-up with your primary care in [place].  Please request a referral to a different sleep specialist as I do not feel comfortable suggesting any other medications at this time for you as you are also taking narcotic pain medication which is high risk.  Improvement of your iron deficiency and anemia may result in improvement of your restless legs as well.  You can also talk to Dr. Larose Kells about a referral to a different specialist when you are back in [place].

## 2022-03-05 NOTE — Progress Notes (Signed)
Subjective:    Patient ID: Sharon Armstrong is a 81 y.o. female.  HPI    Interim history:   Sharon Armstrong is an 80-old right-handed woman with an underlying complex medical history of anemia, aortic valve insufficiency, history of breast cancer, cervical myelopathy with history of cervical stenosis, s/p surgery x 2, diabetes, diverticulosis, elevated liver enzymes, fatty liver, gastroparesis, reflux disease, lumbar spondylosis, hypertension, hyperlipidemia, hypothyroidism, insomnia, osteoarthritis, positive PPD, urticaria, mitral valve regurgitation, LE edema, and restless leg syndrome, who presents for follow-up consultation of her restless leg syndrome and history of neuropathy.  The patient is accompanied by her husband again today. I last saw her on 02/27/2021, at which time she reported some improvement in her restless leg symptoms after starting pramipexole.  She was taking 0.375 mg at bedtime, and had tapered off her levodopa successfully.  She had noticed lower extremity swelling.  We mutually agreed to reduce her pramipexole to 0.25 milligrams in the evening.  She was followed by neurosurgery for her chronic low back pain she was on hydrocodone as needed.  She was also on Ambien as needed.  She called on 03/01/2021 reporting that her cardiologist wanted her off of the pramipexole.  She was advised to taper off of it.  She recently requested a refill indicating that she was still taking pramipexole 1 pill each evening, 0.125 mg strength.  Today, 03/05/2022: She reports taking pramipexole 2 pills in the evening, she does not believe it helps.  She has had lower extremity swelling and Lasix was not helpful, she has been started on hydrochlorothiazide.  She has been in follow-up with GI, has significant anemia and iron deficiency and takes Iron daily.  She is no longer on Ambien.  She has a prescription for trazodone but does not believe she is taking it currently.  She has tried melatonin at  night for sleep but does not recall the dose.  She is currently on hydrocodone 5-325 mg strength 2 pills 3 times daily.  They will be traveling back to [place] for about 6 months soon.    The patient's allergies, current medications, family history, past medical history, past social history, past surgical history and problem list were reviewed and updated as appropriate.    Previously:   I first met her at the request of her primary care physician on 11/02/2020, at which time she reported a prior diagnosis of neuropathy and a longstanding history of restless leg symptoms.  She was on levodopa at the time.  She had been followed by a neurologist in [place] but prior records were not available for my review.  We proceeded with additional work-up for her neuropathy in the form of blood work and we scheduled an EMG nerve conduction velocity test.  She was advised to discuss with her treating neurologist in [place] the possibility of tapering levodopa after which we could consider a different medication.  She called in August 2022 requesting to go ahead with the day prior and we eventually started Mirapex low-dose at 0.125 mg strength with titration to 0.375 mg.   She had blood work on 11/02/2020 which showed a negative ANA, B12 was above 2000, folate normal at 14.6, iron studies showed low iron saturation at 10%, ferritin level was 18.  She was advised to start an iron supplement and talk to her primary care physician about her anemia.  She had an EMG nerve conduction velocity test on 12/05/2020 which showed: IMPRESSION:    Abnormal study demonstrating: -  Mild axonal sensory polyneuropathy. - Slightly prolonged F-wave latencies may indicate mild bilateral lumbosacral radiculopathies.   She was notified of the test results.     11/02/20: (She) reports a longstanding history of restless leg symptoms of over 20 years as well as a diagnosis of neuropathy affecting her feet of over 20 years.  She follows with a  neurologist out of [place].  She goes to [place] yearly.  She typically sees her neurologist at least once a year or one of his assistants.  She has been on levodopa therapy for restless leg syndrome for years.  She does not recall trying a dopamine agonist such as Requip or Mirapex.  I do not have any neurology records available for review.  She was diagnosed many years ago with diabetic neuropathy and reports numbness affecting her feet and sometimes her legs bilaterally.  She has intermittent tingling in both hands but reports a history of neck pain and myelopathy as well as having had surgeries.  Her neurosurgeon is Dr. Sherley Bounds.  She has been seeing Dr. Nelva Bush for pain management and is currently on Ultram.  She does not take any hydrocodone any longer.  I reviewed your office note from 09/06/2020.  She reported a diagnosis of chronic neuropathy and restless leg syndrome.  She also reported that since her neck surgery in November 2021 she has had intermittent cramping in her right and left arm as well as pain.  She has been followed by neurosurgeon, Dr. Sherley Bounds.    You ordered a brain MRI.  She had a brain MRI without contrast on 09/17/2020 and I reviewed the results: IMPRESSION: No acute or subacute finding. Minimal small vessel change of the hemispheric white matter, less than often seen at this age.  Her A1c on 03/03/2020 was 6.9.  6 months prior to that it was 7.3.  She is currently on multiple medications, including Ambien 5 mg strength as needed for sleep, she takes tramadol as needed which helps her restless legs as well.  She takes levodopa 25-250 mg strength 1 pill 4 times a day at this time.  She reports that her symptoms are primarily at night when she has trouble sleeping and she has twitching in her legs and an urge to move her legs and it helps to get up and walk around.   Of note, she has several allergies or intolerances to medications.  Has an intolerance to gabapentin or allergy, had  swelling from it.  She does not recall how long ago she took it and what she took it for.    Her Past Medical History Is Significant For: Past Medical History:  Diagnosis Date   Adenomatous polyps 11/1998   Anemia    Anxiety    Aortic valve insufficiency    mild   Breast cancer ([place]) 04/2007   s/p XRT (last 4/09)   Cervical myelopathy (HCC)    Cervical stenosis of spine    sees neuro surgery and dr.ramons s/p local injection 4/09   Chronic constipation    Chronic pancreatitis ([place]) 09/29/2014   Colon polyps 02/2010   Tubular  Adenomas                                      Diabetes mellitus    Difficulty in urination    pt uses cath at times   Diverticulosis of colon    Elevated  liver function tests    fatty liver per CT 2009   Fatty liver    Full body hives    if patient does not take Fexofenadine   Gastroparesis 10/2015   gastric emptying abnormal for 4 hours   GERD (gastroesophageal reflux disease)    Hemorrhoids    Hyperlipemia    Hypertension    Hypothyroidism    Insomnia    Lumbar spondylosis    Lumbar epidural steroid injection L5-S1 to the left, Dr. Nelva Bush   Mitral valve regurgitation    Osteoarthritis    Personal history of radiation therapy 2009   Positive PPD    never treated father died TB   Restless leg syndrome    Urticaria 06/2009    Her Past Surgical History Is Significant For: Past Surgical History:  Procedure Laterality Date   ANTERIOR CERVICAL DECOMP/DISCECTOMY FUSION N/A 01/08/2013   Procedure: Cervical three-four, four-five, five-six anterior cervical decompression with fusion plating and bonegraft;  Surgeon: Eustace Moore, MD;  Location: [facility];  Service: Neurosurgery;  Laterality: N/A;  Cervical three-four, four-five, five-six anterior cervical decompression with fusion plating and bonegraft   ANTERIOR CERVICAL DECOMP/DISCECTOMY FUSION N/A 03/25/2020   Procedure: CERVICAL TWO-THREE ANTERIOR CERVICAL DECOMPRESSION/DISCECTOMY FUSION WITH REMOVAL  OF PLATE;  Surgeon: Eustace Moore, MD;  Location: [facility];  Service: Neurosurgery;  Laterality: N/A;  3C   APPENDECTOMY     BREAST LUMPECTOMY Left 2009   left breast   CARDIAC CATHETERIZATION  2001   CATARACT EXTRACTION, BILATERAL  2/11   EYE SURGERY     GANGLION CYST EXCISION Right    wrist   sinus cyst removed     TRANSCATHETER AORTIC VALVE REPLACEMENT, TRANSAORTIC  01/09/2019   UTERINE FIBROID SURGERY      Her Family History Is Significant For: Family History  Problem Relation Age of Onset   Hypertension Mother    Lung cancer Mother        mets to brain   Brain cancer Mother    Tuberculosis Father        died at 28   Coronary artery disease Neg Hx    Diabetes Neg Hx    Stroke Neg Hx    Colon cancer Neg Hx    Breast cancer Neg Hx     Her Social History Is Significant For: Social History   Socioeconomic History   Marital status: Married    Spouse name: Not on file   Number of children: 2   Years of education: Not on file   Highest education level: Not on file  Occupational History   Occupation: Advertising account executive  Tobacco Use   Smoking status: Never   Smokeless tobacco: Never  Vaping Use   Vaping Use: Never used  Substance and Sexual Activity   Alcohol use: Yes    Alcohol/week: 0.0 standard drinks of alcohol    Comment: rare   Drug use: No   Sexual activity: Not on file  Other Topics Concern   Not on file  Social History Narrative   Lives in [place] half of the time       Social Determinants of Health   Financial Resource Strain: Not on file  Food Insecurity: Not on file  Transportation Needs: Not on file  Physical Activity: Not on file  Stress: Not on file  Social Connections: Not on file    Her Allergies Are:  Allergies  Allergen Reactions   Ciprofloxacin Hives and Swelling   Gabapentin Swelling  Edema, legs    Iodine Hives   Iohexol    Nsaids Other (See Comments)    REACTION: hallucinations Tolerates Meloxicam    Spironolactone      Elevates potassium   Tolmetin Other (See Comments)    REACTION: hallucinations   Aciphex [Rabeprazole] Rash   Ioxaglate Rash and Hives   Ivp Dye [Iodinated Contrast Media] Hives and Rash   Nexium [Esomeprazole Magnesium] Rash   Pepcid [Famotidine] Rash   Protonix [Pantoprazole Sodium] Rash  :   Her Current Medications Are:  Outpatient Encounter Medications as of 03/05/2022  Medication Sig   ACCU-CHEK GUIDE test strip USE TO TEST BLOOD SUGAR TWICE DAILY- DX CODE E11.9   amLODipine (NORVASC) 5 MG tablet Take 5 mg by mouth daily.   amoxicillin-clavulanate (AUGMENTIN) 875-125 MG tablet Take 1 tablet by mouth 2 (two) times daily.   aspirin EC 81 MG tablet Take 81 mg by mouth daily.    Cyanocobalamin 1000 MCG/ML KIT Inject 1,000 mcg as directed See admin instructions. Inject 1035mcg IM every month and a half.   denosumab (PROLIA) 60 MG/ML SOSY injection Inject 60 mg into the skin every 6 (six) months.    ferrous sulfate 325 (65 FE) MG tablet Take 325 mg by mouth every morning.   fluticasone (FLONASE) 50 MCG/ACT nasal spray Place 2 sprays into both nostrils daily.   hydrochlorothiazide (MICROZIDE) 12.5 MG capsule Take 12.5 mg by mouth every morning.   HYDROcodone-acetaminophen (NORCO) 10-325 MG tablet Take 1 tablet by mouth every 3 (three) hours as needed.   insulin aspart (NOVOLOG FLEXPEN) 100 UNIT/ML FlexPen INJECT 5-7 UNITS BEFORE DINNER AND LARGER MEALS (Patient taking differently: Inject 5-7 Units into the skin 3 (three) times daily as needed (blood sugar above 200).)   Insulin Pen Needle 31G X 6 MM MISC Use to inject insulin up to 4 times daily.   LANTUS SOLOSTAR 100 UNIT/ML Solostar Pen INJECT 35 UNITS SUBCUTANEOUSLY ONCE DAILY (Patient taking differently: INJECT 31 UNITS SUBCUTANEOUSLY ONCE DAILY .)   levocetirizine (XYZAL) 5 MG tablet Take 1 tablet (5 mg total) by mouth every evening.   levothyroxine (SYNTHROID) 112 MCG tablet Take 1 tablet (112 mcg total) by mouth daily before  breakfast.   Lidocaine 4 % PTCH Apply topically.   lisinopril (ZESTRIL) 40 MG tablet Take 40 mg by mouth daily.    metFORMIN (GLUCOPHAGE) 1000 MG tablet Take 1,000 mg by mouth 2 (two) times daily with a meal.    MILK THISTLE PLUS PO Take 450 mg by mouth in the morning and at bedtime.   ondansetron (ZOFRAN-ODT) 4 MG disintegrating tablet Take 1 tablet (4 mg total) by mouth every 8 (eight) hours as needed for nausea or vomiting.   pramipexole (MIRAPEX) 0.125 MG tablet Take 1 tablet (0.125 mg total) by mouth at bedtime. Take 1-1/2 to 2 hours before projected bedtime. (Patient taking differently: Take 0.125 mg by mouth at bedtime. Take 2  tablets nightly)   rosuvastatin (CRESTOR) 5 MG tablet TAKE 1 TABLET (5 MG TOTAL) BY MOUTH DAILY.   solifenacin (VESICARE) 5 MG tablet Take 5 mg by mouth daily.   traZODone (DESYREL) 50 MG tablet TAKE 1.5-2 TABLETS (75-100 MG TOTAL) BY MOUTH AT BEDTIME AS NEEDED FOR SLEEP.   furosemide (LASIX) 20 MG tablet Take 1 tablet by mouth daily.   No facility-administered encounter medications on file as of 03/05/2022.  :  Review of Systems:  Out of a complete 14 point review of systems, all are reviewed and negative  with the exception of these symptoms as listed below:       Review of Systems  Neurological:        Pt here for RLS f/u  Pt states RLS is worse Pt states she is unable to get any sleep because of movement in both legs all night Pt states some pain     Objective:  Neurological Exam  Physical Exam Physical Examination:   Vitals:   03/05/22 1103  BP: (!) 160/68  Pulse: 76   General Examination: The patient is a very pleasant 81 y.o. female in no acute distress. She appears well-developed and well-nourished and well groomed.   HEENT: Normocephalic, atraumatic, pupils are equal, round and reactive to light, s/p bilateral cataract repairs.  Extraocular tracking is well-preserved in all directions, no nystagmus.  Corrective eyeglasses in place.   Hearing is grossly intact, face is symmetric with normal facial animation and normal facial sensation, no facial masking noted.  Neck with decreased range of motion, no carotid bruits.  Right anterior neck scar, stable and unremarkable.  Airway examination reveals stable findings.     Chest: Clear to auscultation without wheezing, rhonchi or crackles noted.   Heart: S1+S2+0, regular and normal without murmurs, rubs or gallops noted.    Abdomen: Soft, non-tender and non-distended.   Extremities: There is trace pitting edema in the distal lower extremities bilaterally.   Skin: Warm and dry without trophic changes noted. There are no varicose veins.   Musculoskeletal: exam reveals no obvious joint deformities.    Neurologically:  Mental status: The patient is awake, alert and oriented in all 4 spheres. Her immediate and remote memory, attention, language skills and fund of knowledge are fair.  She is not able to give details of her history.  Her husband supplements her history. Thought process is linear. Mood is normal and affect is normal.  Cranial nerves II - XII are as described above under HEENT exam.  Motor exam: Normal bulk, strength and tone is noted. There is no obvious tremor. Fine motor skills and coordination: Grossly intact.  Cerebellar testing: No dysmetria or intention tremor. There is no truncal or gait ataxia.  Sensory exam: intact to light touch in the upper and lower extremities.  Gait, station and balance: She stands without difficulty, she walks with mild insecurity, slowly, no shuffling, preserved arm swing, no walking aid, stable.                 Assessment and Plan:    In summary, Sharon Armstrong is an 80-old right-handed woman with an underlying complex medical history of anemia, with iron deficiency, aortic valve insufficiency, history of breast cancer, cervical myelopathy with history of cervical stenosis, s/p surgery x 2, chronic pain with chronic narcotic pain  medication use, diabetes, diverticulosis, elevated liver enzymes, fatty liver, gastroparesis, reflux disease, lumbar spondylosis, hypertension, hyperlipidemia, hypothyroidism, insomnia, osteoarthritis, positive PPD, urticaria, mitral valve regurgitation, LE edema, and restless leg syndrome, who presents for follow-up consultation of her restless leg syndrome of several years duration.  She was on levodopa therapy for years.  She had also tried gabapentin and we started her on pramipexole low-dose.  Unfortunately, she has been struggling with lower extremity edema.  She was supposed to taper off her pramipexole last year but still takes it in low-dose.  She is advised to taper off of it at this time by reducing it to 1 pill once daily for 2 weeks and then stop altogether.  Given her previous  medication trials and lower extremity edema, I do not feel comfortable adding any new medication at this time, plus she is getting ready to travel back to [place] for about 6 months.  At this juncture, she is advised to continue to treat her anemia as it probably ties in with her restless legs symptoms.  She is advised to follow-up with her primary care in [place] and request a referral to a different sleep specialist locally there.  Unfortunately, there is no alcohol on her medications and I would feel safe to try in her case, she is also on chronic narcotic pain medication and has a prescription for trazodone.  She is encouraged to try melatonin 5 mg strength about an hour before bedtime, may go up to 10 mg strength.  I answered all their questions today and the patient and her husband were in agreement.  We will not plan a follow-up in this clinic at this time.   I spent 30 minutes in total face-to-face time and in reviewing records during pre-charting, more than 50% of which was spent in counseling and coordination of care, reviewing test results, reviewing medications and treatment regimen and/or in discussing or reviewing  the diagnosis of RLS, the prognosis and treatment options. Pertinent laboratory and imaging test results that were available during this visit with the patient were reviewed by me and considered in my medical decision making (see chart for details).

## 2022-03-16 LAB — COMPREHENSIVE METABOLIC PANEL
Calcium: 9.8 (ref 8.7–10.7)
eGFR: 63

## 2022-03-16 LAB — BASIC METABOLIC PANEL
BUN: 27 — AB (ref 4–21)
CO2: 26 — AB (ref 13–22)
Chloride: 103 (ref 99–108)
Creatinine: 0.9 (ref 0.5–1.1)
Glucose: 158
Potassium: 4.6 mEq/L (ref 3.5–5.1)
Sodium: 139 (ref 137–147)

## 2022-03-16 LAB — TSH: TSH: 3.69 (ref 0.41–5.90)

## 2022-03-16 LAB — HEMOGLOBIN A1C: Hemoglobin A1C: 7.2

## 2022-03-23 ENCOUNTER — Other Ambulatory Visit: Payer: Self-pay | Admitting: Neurology

## 2022-03-23 DIAGNOSIS — G2581 Restless legs syndrome: Secondary | ICD-10-CM

## 2022-03-23 DIAGNOSIS — Z8669 Personal history of other diseases of the nervous system and sense organs: Secondary | ICD-10-CM

## 2022-03-23 DIAGNOSIS — D508 Other iron deficiency anemias: Secondary | ICD-10-CM

## 2022-04-09 ENCOUNTER — Other Ambulatory Visit (HOSPITAL_COMMUNITY): Payer: Self-pay

## 2022-04-09 NOTE — Telephone Encounter (Signed)
Prolia VOB initiated via [email or web address] 

## 2022-04-09 NOTE — Telephone Encounter (Signed)
Forwarding to Rx Prior Auth Team 

## 2022-04-11 NOTE — Telephone Encounter (Signed)
Pt ready for scheduling on or after 05/12/22   Out-of-pocket cost due at time of visit: $0   Primary: Medicare Prolia co-insurance: 20% (approximately $276) Admin fee co-insurance: 20% (approximately $25)   Secondary: UHC AARP Medicare Supp Prolia co-insurance: Covers Medicare Part B co-insurance Admin fee co-insurance: Covers Medicare Part B co-insurance   Deductible:  Covered by secondary   Prior Auth: does not apply PA# Valid:    ** This summary of benefits is an estimation of the patient's out-of-pocket cost. Exact cost may vary based on individual plan coverage.

## 2022-04-11 NOTE — Telephone Encounter (Signed)
Pt will get her next injection in [place] since she will be there until 06/07/21.

## 2022-04-11 NOTE — Telephone Encounter (Signed)
Noted, thank you

## 2022-04-18 ENCOUNTER — Other Ambulatory Visit: Payer: Self-pay

## 2022-04-18 ENCOUNTER — Encounter: Payer: Self-pay | Admitting: Internal Medicine

## 2022-04-18 MED ORDER — LEVOTHYROXINE SODIUM 112 MCG PO TABS: 112.0000 ug | ORAL_TABLET | Freq: Every day | ORAL | 1 refills | Status: DC

## 2022-05-10 ENCOUNTER — Other Ambulatory Visit: Payer: Self-pay | Admitting: Internal Medicine

## 2022-05-28 ENCOUNTER — Telehealth: Payer: Self-pay | Admitting: Internal Medicine

## 2022-05-28 NOTE — Telephone Encounter (Signed)
Per conversation that was had with the pt on 04/12/22: "Pt will get her next injection in [place] since she will be there until 06/07/21."   Will confirm that this was not received.

## 2022-05-28 NOTE — Telephone Encounter (Signed)
Pt's husband stated she is due for a prolia injection and they would like to get this scheduled before they return to [facility]. Please advise if pt is due and oop cost.

## 2022-05-30 ENCOUNTER — Ambulatory Visit: Payer: Medicare Other | Admitting: Family Medicine

## 2022-05-30 ENCOUNTER — Encounter: Payer: Self-pay | Admitting: Family Medicine

## 2022-05-30 ENCOUNTER — Ambulatory Visit (INDEPENDENT_AMBULATORY_CARE_PROVIDER_SITE_OTHER): Payer: Medicare Other | Admitting: Family Medicine

## 2022-05-30 VITALS — BP 120/76 | HR 65 | Temp 98.8°F | Ht 64.0 in | Wt 157.0 lb

## 2022-05-30 DIAGNOSIS — J0100 Acute maxillary sinusitis, unspecified: Secondary | ICD-10-CM

## 2022-05-30 MED ORDER — FLUTICASONE PROPIONATE 50 MCG/ACT NA SUSP: 2.0000 | Freq: Every day | NASAL | 6 refills | Status: AC

## 2022-05-30 MED ORDER — DOXYCYCLINE HYCLATE 100 MG PO TABS: 100.0000 mg | ORAL_TABLET | Freq: Two times a day (BID) | ORAL | 0 refills | Status: DC

## 2022-05-30 MED ORDER — FLUCONAZOLE 150 MG PO TABS: ORAL_TABLET | ORAL | 0 refills | Status: DC

## 2022-05-30 NOTE — Progress Notes (Signed)
Chief Complaint  Patient presents with   Sinusitis    Sharon Armstrong here for URI complaints.  Duration: 3 weeks  Associated symptoms: sinus congestion, sinus pain, sore throat, and shortness of breath Denies: rhinorrhea, itchy watery eyes, ear pain, ear drainage, wheezing, myalgia, and fevers, coughing Treatment to date: Nyquil, an OTC sinus/allergy nasal spray Sick contacts: No  Past Medical History:  Diagnosis Date   Adenomatous polyps 11/1998   Anemia    Anxiety    Aortic valve insufficiency    mild   Breast cancer ([place]) 04/2007   s/p XRT (last 4/09)   Cervical myelopathy (HCC)    Cervical stenosis of spine    sees neuro surgery and dr.ramons s/p local injection 4/09   Chronic constipation    Chronic pancreatitis ([place]) 09/29/2014   Colon polyps 02/2010   Tubular  Adenomas                                      Diabetes mellitus    Difficulty in urination    pt uses cath at times   Diverticulosis of colon    Elevated liver function tests    fatty liver per CT 2009   Fatty liver    Full body hives    if patient does not take Fexofenadine   Gastroparesis 10/2015   gastric emptying abnormal for 4 hours   GERD (gastroesophageal reflux disease)    Hemorrhoids    Hyperlipemia    Hypertension    Hypothyroidism    Insomnia    Lumbar spondylosis    Lumbar epidural steroid injection L5-S1 to the left, Dr. Nelva Bush   Mitral valve regurgitation    Osteoarthritis    Personal history of radiation therapy 2009   Positive PPD    never treated father died TB   Restless leg syndrome    Urticaria 06/2009    Objective BP 120/76 (BP Location: Left Arm, Patient Position: Sitting, Cuff Size: Normal)   Pulse 65   Temp 98.8 F (37.1 C) (Oral)   Ht '5\' 4"'$  (1.626 m)   Wt 157 lb (71.2 kg)   SpO2 97%   BMI 26.95 kg/m  General: Awake, alert, appears stated age HEENT: AT, [place], ears patent b/l and TM's neg, nares patent w/o discharge, pharynx pink and without exudates, MMM, mild ttp  over max sinuses b/l Neck: No masses or asymmetry Heart: RRR Lungs: CTAB, no accessory muscle use Psych: Age appropriate judgment and insight, normal mood and affect  Acute maxillary sinusitis, recurrence not specified - Plan: doxycycline (VIBRA-TABS) 100 MG tablet  INCS, Tylenol, 7 d of doxy.  Continue to push fluids, practice good hand hygiene. F/u prn. If starting to experience fevers, shaking, or shortness of breath, seek immediate care. Pt voiced understanding and agreement to the plan.  [place], DO 05/30/22 9:54 AM

## 2022-05-30 NOTE — Telephone Encounter (Signed)
Pt was due 12/23 for her Prolia. She gets some in [place] and some here in [place]. She advised me in November that this round would be administered in [place] but was not, now she is late.   Oop was $0 at the time, would we have to check her benefits again since it is a new calendar year?

## 2022-05-30 NOTE — Telephone Encounter (Signed)
Patient's husband called to schedule appt for sinus infection but wanted to follow up on the prolia injection. Husband states they are here for the next week or so because of their daughter having an operation so they would like to go ahead and get it from [place] if possible. They will be here today at 1pm if it can be done today. Please call to advise

## 2022-05-30 NOTE — Patient Instructions (Addendum)
Continue to push fluids, practice good hand hygiene.  If you start having fevers, shaking or shortness of breath, seek immediate care.  OK to take Tylenol 1000 mg (2 extra strength tabs) or 975 mg (3 regular strength tabs) every 6 hours as needed.  Consider Flonase (fluticasone) daily for your nasal congestion for now and for the future.   Let us know if you need anything.

## 2022-05-30 NOTE — Telephone Encounter (Signed)
Prolia VOB initiated via [email or web address] 

## 2022-06-01 NOTE — Telephone Encounter (Signed)
Pt ready for scheduling on or after 06/01/22  Out-of-pocket cost due at time of visit: $0  Primary: Medicare Prolia co-insurance: 0% Admin fee co-insurance: 0%  Secondary: UHC AARP-MEDSUP Prolia co-insurance:  Admin fee co-insurance:   Deductible: $0 met of $240 required (Covered by secondary)  Prior Auth:  PA# Valid:   ** This summary of benefits is an estimation of the patient's out-of-pocket cost. Exact cost may vary based on individual plan coverage.

## 2022-06-04 NOTE — Telephone Encounter (Signed)
Pt can receive her Prolia injection at this time. Out-of-pocket cost due at time of visit: $0

## 2022-06-04 NOTE — Telephone Encounter (Signed)
Appt scheduled 06/06/22.

## 2022-06-04 NOTE — Telephone Encounter (Signed)
LMOM asking for call back to schedule nurse visit.

## 2022-06-06 ENCOUNTER — Ambulatory Visit (INDEPENDENT_AMBULATORY_CARE_PROVIDER_SITE_OTHER): Payer: Medicare Other

## 2022-06-06 DIAGNOSIS — M818 Other osteoporosis without current pathological fracture: Secondary | ICD-10-CM

## 2022-06-06 MED ORDER — DENOSUMAB 60 MG/ML ~~LOC~~ SOSY
60.0000 mg | PREFILLED_SYRINGE | Freq: Once | SUBCUTANEOUS | Status: AC
  Administered 2022-06-06: 60 mg via SUBCUTANEOUS

## 2022-06-06 NOTE — Progress Notes (Signed)
Pt here for Prolia shot. Shot given in left arm subq. Pt handled well.

## 2022-08-03 ENCOUNTER — Other Ambulatory Visit: Payer: Self-pay | Admitting: Internal Medicine

## 2022-08-31 ENCOUNTER — Encounter: Payer: Self-pay | Admitting: Internal Medicine

## 2022-11-05 ENCOUNTER — Telehealth: Payer: Self-pay

## 2022-11-05 NOTE — Telephone Encounter (Signed)
Mychart message sent to schedule.

## 2022-11-05 NOTE — Telephone Encounter (Signed)
Prolia VOB initiated via [email or web address]  Last Prolia inj: 06/06/22 Next Prolia inj DUE: 12/04/22

## 2022-11-05 NOTE — Telephone Encounter (Signed)
Pt ready for scheduling for PROLIA on or after : 12/04/22  Out-of-pocket cost due at time of visit: $0  Primary: [place] MEDICARE Prolia co-insurance: 0% Admin fee co-insurance: 0%  Secondary: UHC MEDSUP Prolia co-insurance:  Admin fee co-insurance:   Medical Benefit Details: Date Benefits were checked: 11/05/22 Deductible: $240 met of $240 required/ Coinsurance: 0%/ Admin Fee: 0%  Prior Auth: N/A PA# Expiration Date:    Pharmacy benefit: Copay $--- If patient wants fill through the pharmacy benefit please send prescription to:  --- , and include estimated need by date in rx notes. Pharmacy will ship medication directly to the office.  Patient NOT eligible for Prolia Copay Card. Copay Card can make patient's cost as little as $25. Link to apply: [email or web address]://[email or web address]  ** This summary of benefits is an estimation of the patient's out-of-pocket cost. Exact cost may very based on individual plan coverage.

## 2022-11-14 LAB — COMPREHENSIVE METABOLIC PANEL WITH GFR
Calcium: 9.5 (ref 8.7–10.7)
eGFR: 70

## 2022-11-14 LAB — BASIC METABOLIC PANEL WITH GFR
BUN: 22 — AB (ref 4–21)
CO2: 30 — AB (ref 13–22)
Chloride: 102 (ref 99–108)
Creatinine: 0.8 (ref 0.5–1.1)
Glucose: 148
Potassium: 3.9 meq/L (ref 3.5–5.1)
Sodium: 143 (ref 137–147)

## 2022-11-14 LAB — LAB REPORT - SCANNED
A1c: 7.7
Albumin, Urine POC: 117
Creatinine, POC: 177 mg/dL
EGFR: 70

## 2022-11-14 LAB — PROTEIN / CREATININE RATIO, URINE
Albumin, U: 117
Creatinine, Urine: 177

## 2022-11-14 LAB — TSH: TSH: 1.99 (ref 0.41–5.90)

## 2022-11-14 LAB — HEMOGLOBIN A1C: Hemoglobin A1C: 7.7

## 2022-12-20 ENCOUNTER — Encounter: Payer: Self-pay | Admitting: Family Medicine

## 2022-12-20 ENCOUNTER — Telehealth: Payer: Self-pay | Admitting: Internal Medicine

## 2022-12-20 ENCOUNTER — Ambulatory Visit (INDEPENDENT_AMBULATORY_CARE_PROVIDER_SITE_OTHER): Payer: Medicare Other | Admitting: Family Medicine

## 2022-12-20 VITALS — BP 110/60 | HR 64 | Temp 97.8°F | Resp 18 | Ht 64.0 in | Wt 161.0 lb

## 2022-12-20 DIAGNOSIS — R3915 Urgency of urination: Secondary | ICD-10-CM | POA: Diagnosis not present

## 2022-12-20 DIAGNOSIS — E538 Deficiency of other specified B group vitamins: Secondary | ICD-10-CM | POA: Diagnosis not present

## 2022-12-20 LAB — POC URINALSYSI DIPSTICK (AUTOMATED)
Blood, UA: NEGATIVE
Glucose, UA: NEGATIVE
Nitrite, UA: NEGATIVE
Protein, UA: NEGATIVE
Spec Grav, UA: 1.015 (ref 1.010–1.025)
Urobilinogen, UA: 0.2 E.U./dL
pH, UA: 5 (ref 5.0–8.0)

## 2022-12-20 MED ORDER — NITROFURANTOIN MONOHYD MACRO 100 MG PO CAPS: 100.0000 mg | ORAL_CAPSULE | Freq: Two times a day (BID) | ORAL | 0 refills | Status: DC

## 2022-12-20 MED ORDER — CYANOCOBALAMIN 1000 MCG/ML IJ SOLN
1000.0000 ug | Freq: Once | INTRAMUSCULAR | Status: AC
Start: 2022-12-20 — End: 2022-12-20
  Administered 2022-12-20: 1000 ug via INTRAMUSCULAR

## 2022-12-20 NOTE — Telephone Encounter (Signed)
Pt called to let Dr. Laury Axon know that the insulin she is taking is [place].

## 2022-12-20 NOTE — Telephone Encounter (Signed)
Noted. We established this during the visit.

## 2022-12-20 NOTE — Progress Notes (Signed)
Established Patient Office Visit  Subjective   Patient ID: Sharon Armstrong, female    DOB: [birth year]  Age: 82 y.o. MRN: [id number]  Chief Complaint  Patient presents with   Back Pain    Sxs started x2 weeks ago. Pt states having lower back pain, urgency, no burning     HPI Discussed the use of AI scribe software for clinical note transcription with the patient, who gave verbal consent to proceed.  History of Present Illness   The patient presents with urinary incontinence and abdominal pain. She reports a sudden urge to urinate that is so strong she often does not make it to the bathroom in time. This has been ongoing for at least six months. She also reports abdominal pain that started about a week ago after a hug from her adult grandson. The pain is located in the mid-abdomen and is noticeable when her pain medication for her back wears off. She also notices the pain when lying on her side at night. She is currently on pain management for her back with Dr. Ethelene Hal and has had back surgery with Dr. Marikay Alar. She also reports being on VESIcare for incontinence, prescribed by her family doctor, Dr. Sharen Hones in [place].  The patient also has diabetes and is on a new insulin regimen, which she is still adjusting to. She is currently taking 37 units of an insulin that starts with an 'O', recently increased from 35 units. She also self-administers vitamin B12 injections, which she is due for tomorrow. She has been trying to drink more water recently due to concerns about kidney issues, as her husband is currently dealing with kidney problems.      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   Aortic valve stenosis,  nonrheumatic 11/12/2018   PCP NOTES >>> 03/08/2015   Loss of weight 01/05/2015   Chronic pancreatitis (HCC) 09/29/2014   Leg cramps 12/08/2013   Insomnia 06/16/2013   Annual physical exam 01/25/2011   Osteoporosis 03/22/2009   Dyslipidemia 03/12/2008   Anxiety state 11/20/2007   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/20/2007   ADENOCARCINOMA, BREAST 11/05/2007   CHEST PAIN 11/05/2007   Hypothyroidism 11/11/2006   DM II (diabetes mellitus, type II), controlled (HCC) 11/11/2006   RESTLESS LEG SYNDROME 11/11/2006   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   Essential hypertension 11/11/2006   DIVERTICULOSIS, COLON 11/11/2006   Osteoarthritis 11/11/2006   Chronic fatigue 11/11/2006   Fatty liver 11/11/2006   Past Medical History:  Diagnosis Date   Adenomatous polyps 11/1998   Anemia    Anxiety    Aortic valve insufficiency    mild   Breast cancer (HCC) 04/2007   s/p XRT (last 4/09)   Cervical myelopathy (HCC)    Cervical stenosis of spine    sees neuro surgery and dr.ramons s/p local injection 4/09   Chronic constipation    Chronic pancreatitis (HCC) 09/29/2014   Colon polyps 02/2010   Tubular  Adenomas                                      Diabetes mellitus    Difficulty in urination    pt uses cath  at times   Diverticulosis of colon    Elevated liver function tests    fatty liver per CT 2009   Fatty liver    Full body hives    if patient does not take Fexofenadine   Gastroparesis 10/2015   gastric emptying abnormal for 4 hours   GERD (gastroesophageal reflux disease)    Hemorrhoids    Hyperlipemia    Hypertension    Hypothyroidism    Insomnia    Lumbar spondylosis    Lumbar epidural steroid injection L5-S1 to the left, Dr. Ethelene Hal   Mitral valve regurgitation    Osteoarthritis    Personal history of radiation therapy 2009   Positive PPD    never treated father died TB   Restless leg syndrome    Urticaria 06/2009   Past Surgical History:  Procedure Laterality Date   ANTERIOR  CERVICAL DECOMP/DISCECTOMY FUSION N/A 01/08/2013   Procedure: Cervical three-four, four-five, five-six anterior cervical decompression with fusion plating and bonegraft;  Surgeon: Tia Alert, MD;  Location: [facility];  Service: Neurosurgery;  Laterality: N/A;  Cervical three-four, four-five, five-six anterior cervical decompression with fusion plating and bonegraft   ANTERIOR CERVICAL DECOMP/DISCECTOMY FUSION N/A 03/25/2020   Procedure: CERVICAL TWO-THREE ANTERIOR CERVICAL DECOMPRESSION/DISCECTOMY FUSION WITH REMOVAL OF PLATE;  Surgeon: Tia Alert, MD;  Location: [facility] OR;  Service: Neurosurgery;  Laterality: N/A;  3C   APPENDECTOMY     BREAST LUMPECTOMY Left 2009   left breast   CARDIAC CATHETERIZATION  2001   CATARACT EXTRACTION, BILATERAL  2/11   EYE SURGERY     GANGLION CYST EXCISION Right    wrist   sinus cyst removed     TRANSCATHETER AORTIC VALVE REPLACEMENT, TRANSAORTIC  01/09/2019   UTERINE FIBROID SURGERY     Social History   Tobacco Use   Smoking status: Never   Smokeless tobacco: Never  Vaping Use   Vaping status: Never Used  Substance Use Topics   Alcohol use: Yes    Alcohol/week: 0.0 standard drinks of alcohol    Comment: rare   Drug use: No   Social History   Socioeconomic History   Marital status: Married    Spouse name: Not on file   Number of children: 2   Years of education: Not on file   Highest education level: Not on file  Occupational History   Occupation: Chiropractor  Tobacco Use   Smoking status: Never   Smokeless tobacco: Never  Vaping Use   Vaping status: Never Used  Substance and Sexual Activity   Alcohol use: Yes    Alcohol/week: 0.0 standard drinks of alcohol    Comment: rare   Drug use: No   Sexual activity: Not on file  Other Topics Concern   Not on file  Social History Narrative   Lives in [place] half of the time       Social Determinants of Health   Financial Resource Strain: Not on file  Food Insecurity: Not on  file  Transportation Needs: Not on file  Physical Activity: Not on file  Stress: Not on file  Social Connections: Not on file  Intimate Partner Violence: Not on file   Family Status  Relation Name Status   Mother  (Not Specified)   Father  (Not Specified)   Neg Hx  (Not Specified)  No partnership data on file   Family History  Problem Relation Age of Onset   Hypertension Mother    Lung cancer Mother  mets to brain   Brain cancer Mother    Tuberculosis Father        died at 20   Coronary artery disease Neg Hx    Diabetes Neg Hx    Stroke Neg Hx    Colon cancer Neg Hx    Breast cancer Neg Hx    Allergies  Allergen Reactions   Ciprofloxacin Hives and Swelling   Gabapentin Swelling    Edema, legs    Iodine Hives   Iohexol    Nsaids Other (See Comments)    REACTION: hallucinations Tolerates Meloxicam    Spironolactone     Elevates potassium   Tolmetin Other (See Comments)    REACTION: hallucinations   Aciphex [Rabeprazole] Rash   Ioxaglate Rash and Hives   Ivp Dye [Iodinated Contrast Media] Hives and Rash   Nexium [Esomeprazole Magnesium] Rash   Pepcid [Famotidine] Rash   Protonix [Pantoprazole Sodium] Rash      ROS    Objective:     BP 110/60 (BP Location: Left Arm, Patient Position: Sitting, Cuff Size: Normal)   Pulse 64   Temp 97.8 F (36.6 C) (Oral)   Resp 18   Ht 5\' 4"  (1.626 m)   Wt 161 lb (73 kg)   SpO2 98%   BMI 27.64 kg/m  BP Readings from Last 3 Encounters:  12/20/22 110/60  05/30/22 120/76  03/05/22 (!) 160/68   Wt Readings from Last 3 Encounters:  12/20/22 161 lb (73 kg)  05/30/22 157 lb (71.2 kg)  03/05/22 161 lb 9.6 oz (73.3 kg)   SpO2 Readings from Last 3 Encounters:  12/20/22 98%  05/30/22 97%  02/09/22 97%      Physical Exam Vitals and nursing note reviewed.  Constitutional:      General: She is not in acute distress.    Appearance: Normal appearance. She is well-developed.  HENT:     Head: Normocephalic and  atraumatic.  Eyes:     General: No scleral icterus.       Right eye: No discharge.        Left eye: No discharge.  Cardiovascular:     Rate and Rhythm: Normal rate and regular rhythm.     Heart sounds: No murmur heard. Pulmonary:     Effort: Pulmonary effort is normal. No respiratory distress.     Breath sounds: Normal breath sounds.  Abdominal:     Palpations: Abdomen is soft.     Tenderness: There is abdominal tenderness. There is right CVA tenderness. There is no guarding or rebound.  Musculoskeletal:        General: Normal range of motion.     Cervical back: Normal range of motion and neck supple.     Right lower leg: No edema.     Left lower leg: No edema.  Skin:    General: Skin is warm and dry.  Neurological:     Mental Status: She is alert and oriented to person, place, and time.  Psychiatric:        Mood and Affect: Mood normal.        Behavior: Behavior normal.        Thought Content: Thought content normal.        Judgment: Judgment normal.      Results for orders placed or performed in visit on 12/20/22  POCT Urinalysis Dipstick (Automated)  Result Value Ref Range   Color, UA yellow    Clarity, UA cloudy    Glucose, UA Negative  Negative   Bilirubin, UA small    Ketones, UA trace    Spec Grav, UA 1.015 1.010 - 1.025   Blood, UA negative    pH, UA 5.0 5.0 - 8.0   Protein, UA Negative Negative   Urobilinogen, UA 0.2 0.2 or 1.0 E.U./dL   Nitrite, UA negative    Leukocytes, UA Trace (A) Negative    Last CBC Lab Results  Component Value Date   WBC 5.2 10/20/2021   HGB 10.8 (L) 10/20/2021   HCT 32.6 (L) 10/20/2021   MCV 96.3 10/20/2021   MCH 29.9 03/23/2020   RDW 14.4 10/20/2021   PLT 108.0 (L) 10/20/2021   Last metabolic panel Lab Results  Component Value Date   GLUCOSE 129 (H) 10/20/2021   NA 139 03/16/2022   K 4.6 03/16/2022   CL 103 03/16/2022   CO2 26 (A) 03/16/2022   BUN 27 (A) 03/16/2022   CREATININE 0.9 03/16/2022   EGFR 63 03/16/2022    CALCIUM 9.8 03/16/2022   PROT 7.3 12/14/2020   ALBUMIN 4.1 12/14/2020   BILITOT 0.6 12/14/2020   ALKPHOS 37 (L) 12/14/2020   AST 33 12/14/2020   ALT 9 12/14/2020   ANIONGAP 15 03/23/2020   Last lipids Lab Results  Component Value Date   CHOL 112 10/20/2021   HDL 43.30 10/20/2021   LDLCALC 51 10/20/2021   TRIG 89.0 10/20/2021   CHOLHDL 3 10/20/2021   Last hemoglobin A1c Lab Results  Component Value Date   HGBA1C 7.2 03/16/2022   Last thyroid functions Lab Results  Component Value Date   TSH 3.69 03/16/2022   Last vitamin D Lab Results  Component Value Date   VD25OH 58.53 09/07/2016   Last vitamin B12 and Folate Lab Results  Component Value Date   VITAMINB12 >2000 (H) 11/02/2020   FOLATE 14.6 11/02/2020      The ASCVD Risk score (Arnett DK, et al., 2019) failed to calculate for the following reasons:   The 2019 ASCVD risk score is only valid for ages 19 to 87    Assessment & Plan:   Problem List Items Addressed This Visit   None Visit Diagnoses     Urinary urgency    -  Primary   Relevant Medications   nitrofurantoin, macrocrystal-monohydrate, (MACROBID) 100 MG capsule   Other Relevant Orders   POCT Urinalysis Dipstick (Automated) (Completed)   Urine Culture   B12 deficiency       Relevant Medications   cyanocobalamin (VITAMIN B12) injection 1,000 mcg (Completed)     Assessment and Plan    Urinary Incontinence Sudden urgency and inability to hold urine for the past six months. Currently on VESIcare 5mg . Possible urinary tract infection. -Start Macrobid twice daily for seven days. -If symptoms persist, consider increasing VESIcare to 10mg  or referral to urogynecologist.  Abdominal Pain New onset pain in the abdominal area after a hug from grandson a week ago. Pain is controlled with current pain medication. -Continue current pain management regimen.  Vitamin B12 Deficiency Patient due for B12 injection. -Administer B12 injection  today.  Diabetes Patient recently switched to [facility] 37 units daily by endocrinologist in [place]. -Continue Soliqua 37 units daily. Patient to confirm medication name via MyChart.        No follow-ups on file.    Donato Schultz, DO

## 2022-12-24 ENCOUNTER — Other Ambulatory Visit: Payer: Self-pay | Admitting: Family Medicine

## 2022-12-24 NOTE — Telephone Encounter (Signed)
Appt scheduled 01/04/23. Pt was making her appt for her prolia shot but she wanted me to let Dr. Laury Axon know that she needs a couple of more days to decide if the antibiotic is helping and she'll call back.

## 2022-12-26 ENCOUNTER — Other Ambulatory Visit: Payer: Self-pay

## 2022-12-26 MED ORDER — SULFAMETHOXAZOLE-TRIMETHOPRIM 800-160 MG PO TABS: 1.0000 | ORAL_TABLET | Freq: Two times a day (BID) | ORAL | 0 refills | Status: DC

## 2023-01-04 ENCOUNTER — Ambulatory Visit (INDEPENDENT_AMBULATORY_CARE_PROVIDER_SITE_OTHER): Payer: Medicare Other

## 2023-01-04 DIAGNOSIS — M818 Other osteoporosis without current pathological fracture: Secondary | ICD-10-CM | POA: Diagnosis not present

## 2023-01-04 MED ORDER — DENOSUMAB 60 MG/ML ~~LOC~~ SOSY
60.0000 mg | PREFILLED_SYRINGE | Freq: Once | SUBCUTANEOUS | Status: AC
Start: 2023-01-04 — End: 2023-01-04
  Administered 2023-01-04: 60 mg via SUBCUTANEOUS

## 2023-01-04 NOTE — Progress Notes (Signed)
Pt here today for Prolia injection per Dr. Drue Novel.   Prolia 60mg /mL injected into subcutaneous R arm. Pt tolerated injection well.    Next in 6 months

## 2023-01-07 ENCOUNTER — Telehealth: Payer: Self-pay

## 2023-01-07 NOTE — Telephone Encounter (Signed)
-----   Message from [facility] sent at 01/07/2023  8:29 AM EDT ----- Regarding: Prolia Pt received her Prolia injection on 01/04/2023.

## 2023-01-07 NOTE — Telephone Encounter (Signed)
Next injection due on 07/17/23.

## 2023-01-16 ENCOUNTER — Telehealth: Payer: Self-pay | Admitting: Internal Medicine

## 2023-01-16 DIAGNOSIS — R32 Unspecified urinary incontinence: Secondary | ICD-10-CM

## 2023-01-16 NOTE — Telephone Encounter (Signed)
Pt called & stated that she was advised per Dr. Laury Axon that if the medication sulfamethoxazole-trimethoprim (BACTRIM DS) 800-160 MG tablet did not help her, to call back & receive a referral to see another doctor. Please call & advise pt for next steps.

## 2023-01-16 NOTE — Addendum Note (Signed)
Addended byConrad [place] D on: 01/16/2023 01:59 PM   Modules accepted: Orders

## 2023-01-16 NOTE — Telephone Encounter (Signed)
Chart reviewed, referral placed to urogynecologist

## 2023-03-25 LAB — HM DIABETES FOOT EXAM

## 2023-04-01 LAB — BASIC METABOLIC PANEL WITH GFR
BUN: 22 — AB (ref 4–21)
CO2: 29 — AB (ref 13–22)
Chloride: 103 (ref 99–108)
Creatinine: 0.8 (ref 0.5–1.1)
Glucose: 122
Potassium: 4 meq/L (ref 3.5–5.1)
Sodium: 143 (ref 137–147)

## 2023-04-01 LAB — TSH: TSH: 4.08 (ref 0.41–5.90)

## 2023-04-01 LAB — LIPID PANEL
Cholesterol: 122 (ref 0–200)
HDL: 37 (ref 35–70)
LDL Cholesterol: 61
Triglycerides: 118 (ref 40–160)

## 2023-04-01 LAB — HEPATIC FUNCTION PANEL
ALT: 43 U/L — AB (ref 7–35)
AST: 51 — AB (ref 13–35)
Alkaline Phosphatase: 42 (ref 25–125)
Bilirubin, Total: 0.5

## 2023-04-01 LAB — CBC AND DIFFERENTIAL
HCT: 35 — AB (ref 36–46)
Hemoglobin: 11.7 — AB (ref 12.0–16.0)
Neutrophils Absolute: 2.5
Platelets: 130 10*3/uL — AB (ref 150–400)
WBC: 4.8

## 2023-04-01 LAB — COMPREHENSIVE METABOLIC PANEL WITH GFR
Albumin: 4 (ref 3.5–5.0)
Calcium: 9.5 (ref 8.7–10.7)
Globulin: 3.2
eGFR: 72

## 2023-04-01 LAB — HEMOGLOBIN A1C: Hemoglobin A1C: 7.1

## 2023-04-01 LAB — VITAMIN B12: Vitamin B-12: 480

## 2023-04-01 LAB — CBC: RBC: 3.6 — AB (ref 3.87–5.11)

## 2023-04-26 ENCOUNTER — Other Ambulatory Visit: Payer: Self-pay | Admitting: Internal Medicine

## 2023-05-14 LAB — BASIC METABOLIC PANEL WITH GFR
BUN: 25 — AB (ref 4–21)
CO2: 30 — AB (ref 13–22)
Chloride: 102 (ref 99–108)
Creatinine: 1 (ref 0.5–1.1)
Glucose: 191
Potassium: 4.2 meq/L (ref 3.5–5.1)
Sodium: 141 (ref 137–147)

## 2023-05-14 LAB — COMPREHENSIVE METABOLIC PANEL WITH GFR
Albumin: 4.1 (ref 3.5–5.0)
Calcium: 9.4 (ref 8.7–10.7)
Globulin: 3.3
eGFR: 58

## 2023-05-14 LAB — TSH: TSH: 2.3 (ref 0.41–5.90)

## 2023-05-14 LAB — CBC: RBC: 3.7 — AB (ref 3.87–5.11)

## 2023-05-14 LAB — HEPATIC FUNCTION PANEL
ALT: 35 U/L (ref 7–35)
AST: 39 — AB (ref 13–35)
Alkaline Phosphatase: 47 (ref 25–125)
Bilirubin, Total: 0.5

## 2023-05-14 LAB — HEMOGLOBIN A1C: Hemoglobin A1C: 7.1

## 2023-05-14 LAB — CBC AND DIFFERENTIAL
HCT: 36 (ref 36–46)
Hemoglobin: 11.8 — AB (ref 12.0–16.0)
Neutrophils Absolute: 2.2
Platelets: 130 10*3/uL — AB (ref 150–400)
WBC: 4.6

## 2023-05-14 LAB — LIPID PANEL
Cholesterol: 118 (ref 0–200)
HDL: 37 (ref 35–70)
LDL Cholesterol: 50
Triglycerides: 153 (ref 40–160)

## 2023-06-10 ENCOUNTER — Other Ambulatory Visit: Payer: Self-pay | Admitting: *Deleted

## 2023-06-10 DIAGNOSIS — M818 Other osteoporosis without current pathological fracture: Secondary | ICD-10-CM

## 2023-06-10 MED ORDER — DENOSUMAB 60 MG/ML ~~LOC~~ SOSY: 60.0000 mg | PREFILLED_SYRINGE | Freq: Once | SUBCUTANEOUS | Status: DC

## 2023-06-10 NOTE — Telephone Encounter (Signed)
Cam order placed

## 2023-06-11 ENCOUNTER — Telehealth: Payer: Self-pay

## 2023-06-11 NOTE — Telephone Encounter (Signed)
Prolia VOB initiated via [email or web address]  Next Prolia inj DUE: 07/04/23

## 2023-06-12 NOTE — Telephone Encounter (Signed)
Pt ready for scheduling for PROLIA on or after : 07/04/23  Out-of-pocket cost due at time of visit: $0  Number of injection/visits approved: ---  Primary: MEDICARE Prolia co-insurance: 0% Admin fee co-insurance: 0%  Secondary: [employer]MEDSUP Prolia co-insurance:  covers the Medicare Part B deductible, co-insurance and 100% of the excess charges Admin fee co-insurance:   Medical Benefit Details: Date Benefits were checked: 06/11/23 Deductible: $0 Met of$257 Required/ Coinsurance: 0%/ Admin Fee: 0%  Prior Auth: N/A PA# Expiration Date:   # of doses approved:  Pharmacy benefit: Copay $--- If patient wants fill through the pharmacy benefit please send prescription to:  --- , and include estimated need by date in rx notes. Pharmacy will ship medication directly to the office.  Patient NOT eligible for Prolia Copay Card. Copay Card can make patient's cost as little as $25. Link to apply: [email or web address]://[email or web address]  ** This summary of benefits is an estimation of the patient's out-of-pocket cost. Exact cost may very based on individual plan coverage.

## 2023-06-12 NOTE — Telephone Encounter (Signed)
 Marland Kitchen

## 2023-07-04 LAB — HEPATIC FUNCTION PANEL
ALT: 35 U/L (ref 7–35)
AST: 42 — AB (ref 13–35)
Alkaline Phosphatase: 44 (ref 25–125)
Bilirubin, Total: 0.5

## 2023-07-04 LAB — BASIC METABOLIC PANEL WITH GFR
BUN: 19 (ref 4–21)
CO2: 30 — AB (ref 13–22)
Chloride: 102 (ref 99–108)
Creatinine: 0.9 (ref 0.5–1.1)
Glucose: 125
Potassium: 3.7 meq/L (ref 3.5–5.1)
Sodium: 144 (ref 137–147)

## 2023-07-04 LAB — COMPREHENSIVE METABOLIC PANEL WITH GFR
Albumin: 3.9 (ref 3.5–5.0)
Calcium: 9.4 (ref 8.7–10.7)
Globulin: 3.4
eGFR: 68

## 2023-07-04 LAB — HEMOGLOBIN A1C: Hemoglobin A1C: 7

## 2023-07-17 ENCOUNTER — Telehealth: Payer: Self-pay | Admitting: Internal Medicine

## 2023-07-17 NOTE — Telephone Encounter (Signed)
 Scheduled pt office visit with Drue Novel.  She will get possibly at office visit. She may get at doctors office in [place]

## 2023-07-17 NOTE — Telephone Encounter (Signed)
 Pt wanted to wait until April for injection, will ins have to be re-run or okay to schedule?

## 2023-07-17 NOTE — Telephone Encounter (Signed)
 Copied from CRM [id number]. Topic: Appointments - Scheduling Inquiry for Clinic >> Jul 17, 2023 11:47 AM Sharon Armstrong wrote: Reason for CRM: Patient is wanting to schedule prolia injection for April 17th, 10:45AM. Attempted to schedule nurse visit and was given the error,  "[facility] (Pool 1) for this appointment has extra providers. This pool should not be used but it contains 1 provider(s):  [facility] at 10:45 AM  You do not have access to overrule this."

## 2023-07-28 ENCOUNTER — Other Ambulatory Visit: Payer: Self-pay | Admitting: Internal Medicine

## 2023-07-29 ENCOUNTER — Telehealth: Payer: Self-pay | Admitting: Internal Medicine

## 2023-07-29 NOTE — Telephone Encounter (Signed)
 Copied from CRM [id number]. Topic: Appointments - Scheduling Inquiry for Clinic >> Jul 26, 2023  5:08 PM Sharon Armstrong wrote: Reason for CRM: Patient wants to schedule for a B12 Injection on April 16 at 02:00 pm. BookIt didn't allow scheduling it gave an error. Patient husband has an appointment on the same day and time and she wants to ride with him. Please call patient back.

## 2023-07-29 NOTE — Telephone Encounter (Signed)
 Note made to give B12 inj during appt w/ Dr. Drue Novel

## 2023-08-13 NOTE — Telephone Encounter (Signed)
 Scheduled pt office visit with Drue Novel.  She will get possibly at office visit. She may get at doctors office in [place]

## 2023-08-16 LAB — HM DIABETES EYE EXAM

## 2023-08-22 ENCOUNTER — Encounter: Payer: Self-pay | Admitting: Internal Medicine

## 2023-08-25 ENCOUNTER — Other Ambulatory Visit: Payer: Self-pay | Admitting: Internal Medicine

## 2023-09-04 ENCOUNTER — Ambulatory Visit: Payer: Medicare Other | Admitting: Internal Medicine

## 2023-09-18 ENCOUNTER — Ambulatory Visit (INDEPENDENT_AMBULATORY_CARE_PROVIDER_SITE_OTHER): Admitting: *Deleted

## 2023-09-18 DIAGNOSIS — E538 Deficiency of other specified B group vitamins: Secondary | ICD-10-CM | POA: Diagnosis not present

## 2023-09-18 MED ORDER — CYANOCOBALAMIN 1000 MCG/ML IJ SOLN
1000.0000 ug | Freq: Once | INTRAMUSCULAR | Status: AC
Start: 2023-09-18 — End: 2023-09-18
  Administered 2023-09-18: 1000 ug via INTRAMUSCULAR

## 2023-09-18 NOTE — Progress Notes (Signed)
 Patient here for monthly b12 injection per physicians order.  Injection given in left deltoid and patient tolerated well.  Next b12 to be given at next office visit 10/18/23.

## 2023-09-24 ENCOUNTER — Ambulatory Visit: Admitting: Internal Medicine

## 2023-10-11 ENCOUNTER — Other Ambulatory Visit: Payer: Self-pay | Admitting: Internal Medicine

## 2023-10-11 DIAGNOSIS — Z1231 Encounter for screening mammogram for malignant neoplasm of breast: Secondary | ICD-10-CM

## 2023-10-18 ENCOUNTER — Ambulatory Visit (INDEPENDENT_AMBULATORY_CARE_PROVIDER_SITE_OTHER): Admitting: Internal Medicine

## 2023-10-18 VITALS — BP 118/60 | HR 70 | Temp 97.9°F | Resp 16 | Ht 64.0 in | Wt 155.4 lb

## 2023-10-18 DIAGNOSIS — G629 Polyneuropathy, unspecified: Secondary | ICD-10-CM

## 2023-10-18 DIAGNOSIS — E038 Other specified hypothyroidism: Secondary | ICD-10-CM | POA: Diagnosis not present

## 2023-10-18 DIAGNOSIS — I10 Essential (primary) hypertension: Secondary | ICD-10-CM

## 2023-10-18 DIAGNOSIS — E538 Deficiency of other specified B group vitamins: Secondary | ICD-10-CM | POA: Diagnosis not present

## 2023-10-18 DIAGNOSIS — E119 Type 2 diabetes mellitus without complications: Secondary | ICD-10-CM | POA: Diagnosis not present

## 2023-10-18 DIAGNOSIS — M818 Other osteoporosis without current pathological fracture: Secondary | ICD-10-CM

## 2023-10-18 DIAGNOSIS — R399 Unspecified symptoms and signs involving the genitourinary system: Secondary | ICD-10-CM

## 2023-10-18 MED ORDER — CYANOCOBALAMIN 1000 MCG/ML IJ SOLN
1000.0000 ug | Freq: Once | INTRAMUSCULAR | Status: AC
  Administered 2023-10-18: 1000 ug via INTRAMUSCULAR

## 2023-10-18 MED ORDER — PREGABALIN 25 MG PO CAPS: 25.0000 mg | ORAL_CAPSULE | Freq: Two times a day (BID) | ORAL | 4 refills | Status: DC

## 2023-10-18 NOTE — Patient Instructions (Signed)
 Start Lyrica (pregabalin): 1 tablet at bedtime for few days. Then increase to 1 tablet twice daily. If it helps neuropathy, we could go up on the dose if needed.   Vaccines I recommend: Get your 2nd and 3rd dose of hepatitis B Flu shot every fall RSV A COVID booster release in September 2024 (if you have not gotten it yet).   GO TO THE LAB :  Get the blood work   Your results will be posted on MyChart with my comments  Next office visit for a checkup by October 2025 Please make an appointment before you leave today

## 2023-10-18 NOTE — Progress Notes (Signed)
 Subjective:    Patient ID: Sharon Armstrong, female    DOB: 06-30-1940, 83 y.o.   MRN: [id number]  DOS:  10/18/2023 Type of visit - description: checkup, here with her husband  Last seen in 2023. Sees multiple doctors. Labs and office notes reviewed.  No new symptoms or concerns. Neuropathy still an issue for the patient.  Review of Systems See above   Past Medical History:  Diagnosis Date   Adenomatous polyps 11/1998   Anemia    Anxiety    Aortic valve insufficiency    mild   Breast cancer (HCC) 04/2007   s/p XRT (last 4/09)   Cervical myelopathy (HCC)    Cervical stenosis of spine    sees neuro surgery and dr.ramons s/p local injection 4/09   Chronic constipation    Chronic pancreatitis (HCC) 09/29/2014   Colon polyps 02/2010   Tubular  Adenomas                                      Diabetes mellitus    Difficulty in urination    pt uses cath at times   Diverticulosis of colon    Elevated liver function tests    fatty liver per CT 2009   Fatty liver    Full body hives    if patient does not take Fexofenadine   Gastroparesis 10/2015   gastric emptying abnormal for 4 hours   GERD (gastroesophageal reflux disease)    Hemorrhoids    Hyperlipemia    Hypertension    Hypothyroidism    Insomnia    Lumbar spondylosis    Lumbar epidural steroid injection L5-S1 to the left, Dr. Rexanne Catalina   Mitral valve regurgitation    Osteoarthritis    Personal history of radiation therapy 2009   Positive PPD    never treated father died TB   Restless leg syndrome    Urticaria 06/2009    Past Surgical History:  Procedure Laterality Date   ANTERIOR CERVICAL DECOMP/DISCECTOMY FUSION N/A 01/08/2013   Procedure: Cervical three-four, four-five, five-six anterior cervical decompression with fusion plating and bonegraft;  Surgeon: Isadora Mar, MD;  Location: [facility];  Service: Neurosurgery;  Laterality: N/A;  Cervical three-four, four-five, five-six anterior cervical decompression with  fusion plating and bonegraft   ANTERIOR CERVICAL DECOMP/DISCECTOMY FUSION N/A 03/25/2020   Procedure: CERVICAL TWO-THREE ANTERIOR CERVICAL DECOMPRESSION/DISCECTOMY FUSION WITH REMOVAL OF PLATE;  Surgeon: Isadora Mar, MD;  Location: [facility] OR;  Service: Neurosurgery;  Laterality: N/A;  3C   APPENDECTOMY     BREAST LUMPECTOMY Left 2009   left breast   CARDIAC CATHETERIZATION  2001   CATARACT EXTRACTION, BILATERAL  2/11   EYE SURGERY     GANGLION CYST EXCISION Right    wrist   sinus cyst removed     TRANSCATHETER AORTIC VALVE REPLACEMENT, TRANSAORTIC  01/09/2019   UTERINE FIBROID SURGERY      Current Outpatient Medications  Medication Instructions   ACCU-CHEK GUIDE test strip USE TO TEST BLOOD SUGAR TWICE DAILY- DX CODE E11.9   amLODipine  (NORVASC ) 5 mg, Daily   aspirin  EC 81 mg, Daily   Cyanocobalamin  1,000 mcg, See admin instructions   denosumab  (PROLIA ) 60 mg, Every 6 months   ferrous sulfate 325 mg, Every morning   fluconazole  (DIFLUCAN ) 150 MG tablet Take 1 tab, repeat in 72 hours if no improvement.   fluticasone  (FLONASE ) 50 MCG/ACT  nasal spray 2 sprays, Each Nare, Daily   furosemide (LASIX) 20 MG tablet 1 tablet, Daily   hydrochlorothiazide  (MICROZIDE ) 12.5 mg, Every morning   insulin  aspart (NOVOLOG  FLEXPEN) 100 UNIT/ML FlexPen INJECT 5-7 UNITS BEFORE DINNER AND LARGER MEALS   Insulin  Pen Needle 31G X 6 MM MISC Use to inject insulin  up to 4 times daily.   levocetirizine (XYZAL ) 5 mg, Oral, Every evening   levothyroxine  (SYNTHROID ) 112 mcg, Oral, Daily before breakfast   Lidocaine  4 % PTCH Apply topically.   lisinopril  (ZESTRIL ) 40 mg, Daily   metFORMIN  (GLUCOPHAGE ) 1,000 mg, 2 times daily with meals   MILK THISTLE PLUS PO 450 mg, 2 times daily   ondansetron  (ZOFRAN -ODT) 4 mg, Oral, Every 8 hours PRN   Oxycodone  HCl 10 MG TABS 1 tablet, 4 times daily PRN   pramipexole  (MIRAPEX ) 0.125 mg, Oral, Daily at bedtime, Take 1-1/2 to 2 hours before projected bedtime.   pregabalin   (LYRICA ) 25 mg, Oral, 2 times daily   rosuvastatin  (CRESTOR ) 5 mg, Oral, Daily   solifenacin (VESICARE) 5 mg, Daily   sulfamethoxazole -trimethoprim  (BACTRIM  DS) 800-160 MG tablet 1 tablet, Oral, 2 times daily   traZODone  (DESYREL ) 75-100 mg, Oral, At bedtime PRN       Objective:   Physical Exam BP 118/60   Pulse 70   Temp 97.9 F (36.6 C) (Oral)   Resp 16   Ht 5\' 4"  (1.626 m)   Wt 155 lb 6 oz (70.5 kg)   SpO2 97%   BMI 26.67 kg/m  General:   Well developed, NAD, BMI noted. HEENT:  Normocephalic . Face symmetric, atraumatic Lungs:  CTA B Normal respiratory effort, no intercostal retractions, no accessory muscle use. Heart: RRR,  no murmur.  Lower extremities: no pretibial edema bilaterally  Skin: Not pale. Not jaundice Neurologic:  alert & oriented X3.  Speech normal, gait appropriate for age and unassisted Psych--  Cognition and judgment appear intact.  Cooperative with normal attention span and concentration.  Behavior appropriate. No anxious or depressed appearing.      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 Started Prolia  approximately 05-2011 GI: used to see Dr Sandrea Cruel, last OV 10-2014 , now Dr Socorro Dunks  --GERD, --Chronic pancreatitis, h/o episodes of acute pancreatitis -- Liver cirrhosis per MRI 12-2019 --Chronic constipation --Nausea, vomiting, eval in [place] ~ 12- 2016: Delayed gastric emptying, EGD biopsy no H. Pylori,SIBO +, Rx ABX Breast cancer dx 2008 XRT H/o +PPD H/o  Urticaria H/ o Mild aortic valve insufficiency   PLAN: Last visit w/  me 01/2022. DM: Per Endo.  A1c 7.0 (07/04/2023, [facility]) Hypothyroidism: TSH 2.3 (December 2024, [facility]).  Check a TSH Osteoporosis: Last Prolia  06/2023 in [place] . Iron deficiency, gastroparesis, constipation,  cirrhosis: LOV GI 09/11/2023 ([facility]). Hemoglobin 12.24 August 2022. I see note from GI stating that extensive workup for liver disease was negative, some autoimmune markers are + but unlikely to be significant.  Hemoglobin and platelets are slightly low. The patient asked me to explain the results  >>  all of the above was explained in plain language, she verbalized understanding. Was recommended to get vaccines for hepatitis B. S/p 1st vax, rec additional hep B vaccine at 1 in 6 months. B12 deficiency: Got a shot today Cardiology OV 11/2022 on HCTZ, carvedilol, Lasix as needed, intolerant to Spiro. Polyneuropathy, bilateral lumbosacral radiculopathies: Demonstrated at a nerve conductive study 11-2020 ordered by neurology.  In the past gabapentin  did not help and is now listed on her allergies. Eval treatment options, recommend a trial with Lyrica  25 mg twice daily, starting on a double dose, we could go up. LUTS: Also she called back after the visit and said that her urine was dark in the last few days, request testing.  Will check a urine culture (not enough sample for a UA) Preventive care: Vaccines advised, see AVS. Has a mammogram scheduled. RTC October 2025 before she goes to [place] 

## 2023-10-19 LAB — MICROALBUMIN / CREATININE URINE RATIO
Creatinine, Urine: 218 mg/dL (ref 20–275)
Microalb Creat Ratio: 38 mg/g{creat} — ABNORMAL HIGH (ref <30)
Microalb, Ur: 8.2 mg/dL

## 2023-10-19 LAB — TSH: TSH: 2.28 m[IU]/L (ref 0.40–4.50)

## 2023-10-20 DIAGNOSIS — G629 Polyneuropathy, unspecified: Secondary | ICD-10-CM | POA: Insufficient documentation

## 2023-10-20 NOTE — Assessment & Plan Note (Signed)
 Last visit w/  me 01/2022. DM: Per Endo.  A1c 7.0 (07/04/2023, [facility]) Hypothyroidism: TSH 2.3 (December 2024, [facility]).  Check a TSH Osteoporosis: Last Prolia  06/2023 in [place] . Iron deficiency, gastroparesis, constipation,  cirrhosis: LOV GI 09/11/2023 ([facility]). Hemoglobin 12.24 August 2022. I see note from GI stating that extensive workup for liver disease was negative, some autoimmune markers are + but unlikely to be significant.  Hemoglobin and platelets are slightly low. The patient asked me to explain the results  >> all of the above was explained in plain language, she verbalized understanding. Was recommended to get vaccines for hepatitis B. S/p 1st vax, rec additional hep B vaccine at 1 in 6 months. B12 deficiency: Got a shot today Cardiology OV 11/2022 on HCTZ, carvedilol, Lasix as needed, intolerant to Spiro. Polyneuropathy, bilateral lumbosacral radiculopathies: Demonstrated at a nerve conductive study 11-2020 ordered by neurology.  In the past gabapentin  did not help and is now listed on her allergies. Eval treatment options, recommend a trial with Lyrica  25 mg twice daily, starting on a double dose, we could go up. LUTS: Also she called back after the visit and said that her urine was dark in the last few days, request testing.  Will check a urine culture (not enough sample for a UA) Preventive care: Vaccines advised, see AVS. Has a mammogram scheduled. RTC October 2025 before she goes to [place] 

## 2023-10-21 ENCOUNTER — Ambulatory Visit: Payer: Self-pay | Admitting: Internal Medicine

## 2023-10-21 ENCOUNTER — Encounter: Payer: Self-pay | Admitting: Internal Medicine

## 2023-10-21 LAB — URINE CULTURE
MICRO NUMBER:: 16519982
SPECIMEN QUALITY:: ADEQUATE

## 2023-10-21 MED ORDER — SULFAMETHOXAZOLE-TRIMETHOPRIM 800-160 MG PO TABS: 1.0000 | ORAL_TABLET | Freq: Two times a day (BID) | ORAL | 0 refills | Status: DC

## 2023-10-22 ENCOUNTER — Encounter: Payer: Self-pay | Admitting: Internal Medicine

## 2023-10-22 DIAGNOSIS — H40009 Preglaucoma, unspecified, unspecified eye: Secondary | ICD-10-CM | POA: Insufficient documentation

## 2023-10-23 ENCOUNTER — Ambulatory Visit
Admission: RE | Admit: 2023-10-23 | Discharge: 2023-10-23 | Disposition: A | Discharge status: Home / Self Care | Source: Ambulatory Visit | Attending: Internal Medicine | Admitting: Internal Medicine

## 2023-10-23 ENCOUNTER — Ambulatory Visit

## 2023-10-23 DIAGNOSIS — Z1231 Encounter for screening mammogram for malignant neoplasm of breast: Secondary | ICD-10-CM

## 2023-10-28 ENCOUNTER — Telehealth: Payer: Self-pay

## 2023-10-28 ENCOUNTER — Other Ambulatory Visit: Payer: Self-pay | Admitting: Internal Medicine

## 2023-10-28 MED ORDER — LEVOTHYROXINE SODIUM 112 MCG PO TABS: 112.0000 ug | ORAL_TABLET | Freq: Every day | ORAL | 1 refills | Status: DC

## 2023-10-28 NOTE — Telephone Encounter (Signed)
 Copied from CRM [id number]. Topic: Clinical - Medication Refill >> Oct 28, 2023  1:51 PM Chuck Crater wrote: Medication: levothyroxine  (SYNTHROID ) 112 MCG tablet   Has the patient contacted their pharmacy? Yes (Agent: If no, request that the patient contact the pharmacy for the refill. If patient does not wish to contact the pharmacy document the reason why and proceed with request.) (Agent: If yes, when and what did the pharmacy advise?) need a prescription since the last refill was in [place]   This is the patient's preferred pharmacy:   [facility] [address], [place] - [address] [place] [address] Phone: [phone] Fax: [phone]  Is this the correct pharmacy for this prescription? Yes If no, delete pharmacy and type the correct one.   Has the prescription been filled recently? No 90 day supply   Is the patient out of the medication? No 3 pills left   Has the patient been seen for an appointment in the last year OR does the patient have an upcoming appointment? Yes  Can we respond through MyChart? No  Agent: Please be advised that Rx refills may take up to 3 business days. We ask that you follow-up with your pharmacy.

## 2023-10-28 NOTE — Telephone Encounter (Signed)
 Awaiting results

## 2023-10-28 NOTE — Telephone Encounter (Signed)
 Copied from CRM [id number]. Topic: Clinical - Lab/Test Results >> Oct 28, 2023  1:49 PM Chuck Crater wrote: Reason for CRM: Patient is requesting a callback in regards to her mammogram results. She has not received them.

## 2023-10-29 NOTE — Telephone Encounter (Signed)
 IMPRESSION: No mammographic evidence of malignancy. A result letter of this screening mammogram will be mailed directly to the patient.   RECOMMENDATION: Screening mammogram in one year. (Code:[id number])   BI-RADS CATEGORY  1: Negative.    Sent to Pt's mychart.

## 2023-11-18 ENCOUNTER — Ambulatory Visit: Payer: Self-pay

## 2023-11-18 NOTE — Telephone Encounter (Signed)
 FYI Only or Action Required?: FYI only for provider.  Patient was last seen in primary care on 10/18/2023 by Amon Aloysius BRAVO, MD. Called Nurse Triage reporting Mass. Symptoms began 10/11/2023. Interventions attempted: Prescription medications: UTI-Abx. Symptoms are: gradually worsening.  Triage Disposition: See Physician Within 24 Hours, See PCP When Office is Open (Within 3 Days)  Patient/caregiver understands and will follow disposition?: Yes      Copied from CRM [id number]. Topic: Clinical - Red Word Triage >> Nov 18, 2023  9:01 AM Mesmerise C wrote: [facility] that prompted transfer to Nurse Triage: Patient stated she still has UTI and has a knot on the top of her head no pain very hard and keeps growing been ongoing for 6 months now Reason for Disposition  Caller can't describe it clearly    Skin lesion is getting bigger  Unusual vaginal discharge (e.g., bad smelling, yellow, green, or foamy-white)  Answer Assessment - Initial Assessment Questions 1. SEVERITY: How bad is the pain?  (e.g., Scale 1-10; mild, moderate, or severe)   - MILD (1-3): complains slightly about urination hurting   - MODERATE (4-7): interferes with normal activities     - SEVERE (8-10): excruciating, unwilling or unable to urinate because of the pain      mild 2. FREQUENCY: How many times have you had painful urination today?       Each urination 3. PATTERN: Is pain present every time you urinate or just sometimes?      constant 4. ONSET: When did the painful urination start?      10/11/2023  5. FEVER: Do you have a fever? If Yes, ask: What is your temperature, how was it measured, and when did it start?     no 6. PAST UTI: Have you had a urine infection before? If Yes, ask: When was the last time? and What happened that time?      Yes - ABX 7. CAUSE: What do you think is causing the painful urination?  (e.g., UTI, scratch, Herpes sore)     UTI 8. OTHER SYMPTOMS: Do you have any other  symptoms? (e.g., blood in urine, flank pain, genital sores, urgency, vaginal discharge)     Darker urine, urine odor 9. PREGNANCY: Is there any chance you are pregnant? When was your last menstrual period?     N/a  UTI started on 10/11/2023 given ABX was getting and now getting worse.  Answer Assessment - Initial Assessment Questions 1. APPEARANCE of LESION: What does it look like?      Growth on right side of head located into the hairline 2. SIZE: How big is it? (e.g., inches, cm; or compare to size of pinhead, tip of pen, eraser, coin, pea, grape, ping pong ball)      Penny sized 3. COLOR: What color is it? Is there more than one color?     skin 4. SHAPE: What shape is it? (e.g., round, irregular)     unknown 5. RAISED: Does it stick up above the skin or is it flat? (e.g., raised or elevated)     N/a 6. TENDER: Does it hurt when you touch it?  (Scale 1-10; or mild, moderate, severe)     no 7. LOCATION: Where is it located?  Lesion is getting bigger - right side of head into hairline 8. ONSET: When did it first appear?      6 months to year 9. NUMBER: Is there just one? or Are there others?  1 10. CAUSE: What do you think it is?       unknown 11. OTHER SYMPTOMS: Do you have any other symptoms? (e.g., fever)       No 12. PREGNANCY: Is there any chance you are pregnant? When was your last menstrual period?       N/a  Protocols used: Urination Pain - Female-A-AH, Skin Lesion - Moles or Growths-A-AH

## 2023-11-19 ENCOUNTER — Encounter: Payer: Self-pay | Admitting: Internal Medicine

## 2023-11-19 ENCOUNTER — Ambulatory Visit (INDEPENDENT_AMBULATORY_CARE_PROVIDER_SITE_OTHER): Admitting: Internal Medicine

## 2023-11-19 VITALS — BP 122/68 | HR 62 | Temp 98.3°F | Resp 16 | Ht 64.0 in | Wt 157.1 lb

## 2023-11-19 DIAGNOSIS — T7840XA Allergy, unspecified, initial encounter: Secondary | ICD-10-CM | POA: Diagnosis not present

## 2023-11-19 DIAGNOSIS — R399 Unspecified symptoms and signs involving the genitourinary system: Secondary | ICD-10-CM | POA: Diagnosis not present

## 2023-11-19 LAB — POC URINALSYSI DIPSTICK (AUTOMATED)
Blood, UA: NEGATIVE
Glucose, UA: NEGATIVE
Ketones, UA: NEGATIVE
Nitrite, UA: NEGATIVE
Protein, UA: POSITIVE — AB
Spec Grav, UA: <=1.005 — AB (ref 1.010–1.025)
Urobilinogen, UA: 0.2 U/dL
pH, UA: 7 (ref 5.0–8.0)

## 2023-11-19 NOTE — Progress Notes (Unsigned)
 Subjective:    Patient ID: Sharon Armstrong, female    DOB: [birth year], 83 y.o.   MRN: [id number]  DOS:  11/19/2023 Type of visit - description: Acute  At the last visit, was diagnosed with a UTI, UCX show E. coli, was prescribed Bactrim . Symptoms stop but they have resurfaced in the last few days: Cloudy urine, + unusual odor. No fever or chills.  No nausea or vomiting.  No vaginal discharge or bleeding. No fecal material in the urine.  Also, shortly after she took Bactrim  developed generalized pruritus without a rash.  She also restarted her Lyrica  at the time, thinks she had an allergic reaction.  Also, a year ago noted a bump on the right scalp.  Review of Systems See above   Past Medical History:  Diagnosis Date   Adenomatous polyps 11/1998   Anemia    Anxiety    Aortic valve insufficiency    mild   Breast cancer (HCC) 04/2007   s/p XRT (last 4/09)   Cervical myelopathy (HCC)    Cervical stenosis of spine    sees neuro surgery and dr.ramons s/p local injection 4/09   Chronic constipation    Chronic pancreatitis (HCC) 09/29/2014   Colon polyps 02/2010   Tubular  Adenomas                                      Diabetes mellitus    Difficulty in urination    pt uses cath at times   Diverticulosis of colon    Elevated liver function tests    fatty liver per CT 2009   Fatty liver    Full body hives    if patient does not take Fexofenadine   Gastroparesis 10/2015   gastric emptying abnormal for 4 hours   GERD (gastroesophageal reflux disease)    Hemorrhoids    Hyperlipemia    Hypertension    Hypothyroidism    Insomnia    Lumbar spondylosis    Lumbar epidural steroid injection L5-S1 to the left, Dr. Bonner   Mitral valve regurgitation    Osteoarthritis    Personal history of radiation therapy 2009   Positive PPD    never treated father died TB   Restless leg syndrome    Urticaria 06/2009    Past Surgical History:  Procedure Laterality Date   ANTERIOR CERVICAL  DECOMP/DISCECTOMY FUSION N/A 01/08/2013   Procedure: Cervical three-four, four-five, five-six anterior cervical decompression with fusion plating and bonegraft;  Surgeon: Alm GORMAN Molt, MD;  Location: [facility];  Service: Neurosurgery;  Laterality: N/A;  Cervical three-four, four-five, five-six anterior cervical decompression with fusion plating and bonegraft   ANTERIOR CERVICAL DECOMP/DISCECTOMY FUSION N/A 03/25/2020   Procedure: CERVICAL TWO-THREE ANTERIOR CERVICAL DECOMPRESSION/DISCECTOMY FUSION WITH REMOVAL OF PLATE;  Surgeon: Molt Alm GORMAN, MD;  Location[facility];  Service: Neurosurgery;  Laterality: N/A;  3C   APPENDECTOMY     BREAST LUMPECTOMY Left 2009   left breast   CARDIAC CATHETERIZATION  2001   CATARACT EXTRACTION, BILATERAL  2/11   EYE SURGERY     GANGLION CYST EXCISION Right    wrist   sinus cyst removed     TRANSCATHETER AORTIC VALVE REPLACEMENT, TRANSAORTIC  01/09/2019   UTERINE FIBROID SURGERY      Current Outpatient Medications  Medication Instructions   ACCU-CHEK GUIDE test strip USE TO TEST BLOOD SUGAR TWICE DAILY- DX  CODE E11.9   amLODipine  (NORVASC ) 5 mg, Daily   aspirin  EC 81 mg, Daily   Cyanocobalamin  1,000 mcg, See admin instructions   denosumab  (PROLIA ) 60 mg, Every 6 months   ferrous sulfate 325 mg, Every morning   fluconazole  (DIFLUCAN ) 150 MG tablet Take 1 tab, repeat in 72 hours if no improvement.   fluticasone  (FLONASE ) 50 MCG/ACT nasal spray 2 sprays, Each Nare, Daily   furosemide (LASIX) 20 MG tablet 1 tablet, Daily   hydrochlorothiazide  (MICROZIDE ) 12.5 mg, Every morning   insulin  aspart (NOVOLOG  FLEXPEN) 100 UNIT/ML FlexPen INJECT 5-7 UNITS BEFORE DINNER AND LARGER MEALS   Insulin  Pen Needle 31G X 6 MM MISC Use to inject insulin  up to 4 times daily.   levocetirizine (XYZAL ) 5 mg, Oral, Every evening   levothyroxine  (SYNTHROID ) 112 mcg, Oral, Daily before breakfast   Lidocaine  4 % PTCH Apply topically.   lisinopril  (ZESTRIL ) 40 mg, Daily    metFORMIN  (GLUCOPHAGE ) 1,000 mg, 2 times daily with meals   MILK THISTLE PLUS PO 450 mg, 2 times daily   ondansetron  (ZOFRAN -ODT) 4 mg, Oral, Every 8 hours PRN   Oxycodone  HCl 10 MG TABS 1 tablet, 4 times daily PRN   pramipexole  (MIRAPEX ) 0.125 mg, Oral, Daily at bedtime, Take 1-1/2 to 2 hours before projected bedtime.   pregabalin  (LYRICA ) 25 mg, Oral, 2 times daily   rosuvastatin  (CRESTOR ) 5 mg, Oral, Daily   solifenacin (VESICARE) 5 mg, Daily   sulfamethoxazole -trimethoprim  (BACTRIM  DS) 800-160 MG tablet 1 tablet, Oral, 2 times daily   traZODone  (DESYREL ) 75-100 mg, Oral, At bedtime PRN       Objective:   Physical Exam HENT:     Head:     BP 122/68   Pulse 62   Temp 98.3 F (36.8 C) (Oral)   Resp 16   Ht 5' 4 (1.626 m)   Wt 157 lb 2 oz (71.3 kg)   SpO2 98%   BMI 26.97 kg/m  General:   Well developed, NAD, BMI noted. HEENT:  Normocephalic . Face symmetric, atraumatic   Neurologic:  alert & oriented X3.  Speech normal, gait appropriate for age and unassisted Psych--  Cognition and judgment appear intact.  Cooperative with normal attention span and concentration.  Behavior appropriate. No anxious or depressed appearing.      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 Started Prolia  approximately 05-2011 GI: used to see Dr Aneita, last OV 10-2014 , now Dr Lemont  --GERD, --Chronic pancreatitis, h/o episodes of acute pancreatitis -- Liver cirrhosis per MRI 12-2019 --Chronic  constipation --Nausea, vomiting, eval in [place] ~ 12- 2016: Delayed gastric emptying, EGD biopsy no H. Pylori,SIBO +, Rx ABX Breast cancer dx 2008 XRT H/o +PPD H/o Urticaria H/ o Mild aortic valve insufficiency   PLAN: UTI: Urine culture 10/18/2023 show E. coli, was treated with Bactrim .  Symptoms decreased, subsequently developed a rash. Plan: Recheck a UA urine culture, treat based on results.  Used to see urology, consider urology reevaluation. Allergic reaction to Bactrim .  At the last visit was prescribed Bactrim  and restarted Lyrica , shortly after she developed generalized itching without a rash.  After she finished Bactrim  she also stopped Lyrica  and symptoms are gone. Suspect this was probably a reaction to Bactrim , we agreed to place it on her allergy list.  Rechallenge with Lyrica .  To stop immediately if she has pruritus, seek attention if symptoms severe. Bump, scalp: See physical exam, suspect is a bony lesion, we agreed on observation, will let me know if area grows.

## 2023-11-20 ENCOUNTER — Telehealth: Payer: Self-pay

## 2023-11-20 LAB — URINALYSIS, ROUTINE W REFLEX MICROSCOPIC
Bilirubin Urine: NEGATIVE
Hgb urine dipstick: NEGATIVE
Ketones, ur: NEGATIVE
Nitrite: NEGATIVE
Specific Gravity, Urine: 1.01 (ref 1.000–1.030)
Total Protein, Urine: NEGATIVE
Urine Glucose: NEGATIVE
Urobilinogen, UA: 0.2 (ref 0.0–1.0)
pH: 7 (ref 5.0–8.0)

## 2023-11-20 NOTE — Assessment & Plan Note (Signed)
 UTI: Urine culture 10/18/2023 show E. coli, was treated with Bactrim .  Symptoms decreased, subsequently developed a rash. Plan: Recheck a UA urine culture, treat based on results.  Used to see urology, consider urology reevaluation. Allergic reaction to Bactrim . At the last visit was prescribed Bactrim  and restarted Lyrica , shortly after she developed generalized itching without a rash.  After she finished Bactrim  she also stopped Lyrica  and symptoms are gone. Suspect this was probably a reaction to Bactrim , we agreed to place it on her allergy list.  Rechallenge with Lyrica .  To stop immediately if she has pruritus, seek attention if symptoms severe. Bump, scalp: See physical exam, suspect is a bony lesion, we agreed on observation, will let me know if area grows.

## 2023-11-20 NOTE — Telephone Encounter (Signed)
 Advise patient, we will call once the culture and sensitivity are completed

## 2023-11-20 NOTE — Telephone Encounter (Signed)
 Copied from CRM [id number]. Topic: Clinical - Lab/Test Results >> Nov 20, 2023 11:46 AM Wess RAMAN wrote: Reason for CRM: Patient would like a call back from Dr. Amon or the nurse to discuss labs and have medication sent to preferred pharmacy  Callback #: [phone]  Preferred Pharmacy: [facility] [address] [place] - [address] [phone] [place] [address]Eynwz: [phone] Fax: [phone]: Not open 24 hours

## 2023-11-21 LAB — URINE CULTURE
MICRO NUMBER:: 16646672
SPECIMEN QUALITY:: ADEQUATE

## 2023-11-21 MED ORDER — AMOXICILLIN-POT CLAVULANATE 875-125 MG PO TABS: 1.0000 | ORAL_TABLET | Freq: Two times a day (BID) | ORAL | 0 refills | Status: DC

## 2023-11-21 NOTE — Telephone Encounter (Signed)
 Copied from CRM [id number]. Topic: Clinical - Medication Question >> Nov 21, 2023 10:17 AM Deaijah H wrote: Reason for CRM: Patient's husband called in to have antibiotic sent in for patient due to UTI.

## 2023-11-21 NOTE — Telephone Encounter (Addendum)
 Sensitivity of the urine cultures   released few minutes ago. She is not allergic to penicillins that I know.   Plan: Augmentin  875 mg twice a day, 14 tablets.  No refills.

## 2023-11-21 NOTE — Telephone Encounter (Signed)
 Per PCP waiting on urine culture results

## 2023-11-21 NOTE — Telephone Encounter (Signed)
 Mychart message sent. Will also contact Pt via telephone shortly.

## 2023-11-21 NOTE — Telephone Encounter (Signed)
 Urine culture back, Pt has called again. Please advise?

## 2023-11-21 NOTE — Telephone Encounter (Signed)
 Last read by Lum JONELLE Newton at 1:44PM on 11/21/2023.

## 2023-11-22 ENCOUNTER — Ambulatory Visit: Payer: Self-pay | Admitting: Internal Medicine

## 2023-12-05 ENCOUNTER — Ambulatory Visit: Payer: Self-pay

## 2023-12-05 DIAGNOSIS — N390 Urinary tract infection, site not specified: Secondary | ICD-10-CM

## 2023-12-05 NOTE — Telephone Encounter (Signed)
 [place]  [facility]. She could also be seen by urology.

## 2023-12-05 NOTE — Telephone Encounter (Signed)
 FYI Only or Action Required?: Action required by provider: referral request.  Patient was last seen in primary care on 11/19/2023 by Amon Aloysius BRAVO, MD.  Called Nurse Triage reporting Urinary symptoms.  Symptoms began a week ago.  Interventions attempted: Prescription medications:  SABRA  Symptoms are: unchanged. Pt. Still having odor to urine, cloudy urine. Requests referral to female urologist.  Triage Disposition: See Physician Within 24 Hours  Patient/caregiver understands and will follow disposition?: No, wishes to speak with PCP    Copied from CRM [id number]. Topic: Clinical - Red Word Triage >> Dec 05, 2023  1:47 PM Suzen RAMAN wrote: Red Word that prompted transfer to Nurse Triage: Ongoing Urinary tract infection. Reason for Disposition  Bad or foul-smelling urine  Answer Assessment - Initial Assessment Questions 1. SYMPTOM: What's the main symptom you're concerned about? (e.g., frequency, incontinence)     Odor, cloudy urine 2. ONSET: When did the    start?     1-2 weeks ago 3. PAIN: Is there any pain? If Yes, ask: How bad is it? (Scale: 1-10; mild, moderate, severe)     no 4. CAUSE: What do you think is causing the symptoms?     UTI 5. OTHER SYMPTOMS: Do you have any other symptoms? (e.g., blood in urine, fever, flank pain, pain with urination)     NO 6. PREGNANCY: Is there any chance you are pregnant? When was your last menstrual period?     No  Protocols used: Urinary Symptoms-A-AH

## 2023-12-05 NOTE — Telephone Encounter (Signed)
 Do you know of a female urologist anywhere?

## 2023-12-05 NOTE — Telephone Encounter (Signed)
Referral placed to urogyn

## 2023-12-06 ENCOUNTER — Telehealth: Payer: Self-pay | Admitting: Internal Medicine

## 2023-12-06 NOTE — Telephone Encounter (Signed)
 Pt was sent to a urogyn (female provider)- unfortunately there are not many female urologists in the field. This will delay treatment time. Recommend she ask them to place her on the wait list if someone cancels.

## 2023-12-06 NOTE — Telephone Encounter (Signed)
 Copied from CRM [id number]. Topic: Referral - Question >> Dec 06, 2023  3:32 PM Berneda FALCON wrote: Reason for CRM: Pt has had a bladder infection for quite a while now and has just finished her second round of antibiotics and it is still persistent. She called the urologist that she was referred to but they cannot get her in until September. She would like to be seen sooner to get this resolved please. Prefers a female Dr. Please.  Patient callback is [phone]

## 2023-12-18 ENCOUNTER — Telehealth: Payer: Self-pay

## 2023-12-18 NOTE — Telephone Encounter (Signed)
 Copied from CRM [id number]. Topic: General - Other >> Dec 18, 2023  3:46 PM Macario HERO wrote: Reason for CRM: Patient is requesting a call from Dr. Amon nurse regarding her referral tomorrow morning because she will be leaving her home at 1pm.

## 2023-12-19 NOTE — Telephone Encounter (Signed)
 LMOM asking for call back.

## 2023-12-23 NOTE — Telephone Encounter (Signed)
 Patient called back and would like a call back when your available to do so. Patient states she is available for the remainder of the afternoon.   CB#[phone]

## 2023-12-24 NOTE — Telephone Encounter (Signed)
 Spoke w/ Pt's husband, Sharolyn, he wanted to see if there was anything we can do to move her urogyn referral sooner. Informed Pt requested female only provider (there are only 1-2 in the area, the rest are female) and that this can hinder the time of referral/appts. Informed he could have urogyn office put her on wait list (she already is), Sharolyn stated he will discuss with Pt to see if she wants to see a female provider sooner. He is worried that she may have another UTI, informed she will need an appt or at the very least drop off a urine sample for testing. Sharolyn stated he would discuss w/ Pt and let us  know.

## 2023-12-24 NOTE — Telephone Encounter (Unsigned)
 Copied from CRM #[id number]. Topic: General - Other >> Dec 24, 2023  3:39 PM Pinkey ORN wrote: Reason for CRM: Requesting Office Call Back >> Dec 24, 2023  3:40 PM Pinkey ORN wrote: Patient is requesting a call back from Karrine Kluttz, [place]. Patient call back number [phone]

## 2023-12-25 NOTE — Telephone Encounter (Signed)
 Tried calling Pt, no answer. Okay for E2C2 to get more information when/if Pt calls back.

## 2023-12-27 ENCOUNTER — Ambulatory Visit: Payer: Self-pay

## 2023-12-27 NOTE — Telephone Encounter (Signed)
 FYI Only or Action Required?: FYI only for provider.  Patient was last seen in primary care on 11/19/2023 by Amon Aloysius BRAVO, MD.  Called Nurse Triage reporting Dizziness/urine dark with foul odor.  Symptoms began several weeks ago.  Interventions attempted: Prescription medications: finished 2 rounds of Abx and still sx.  Symptoms are: gradually worsening.  Triage Disposition: See Physician Within 24 Hours  Patient/caregiver understands and will follow disposition?: to Urgent Care           Copied from CRM [id number]. Topic: Clinical - Red Word Triage >> Dec 27, 2023 12:16 PM Pinkey ORN wrote: Red Word that prompted transfer to Nurse Triage: Dizzy Spells >> Dec 27, 2023 12:17 PM Pinkey ORN wrote: Patient states she's been experiencing dizzy spells for months now, states she just ignores them now hoping they go away. Patient states she is also experiencing symptoms of UTI that's been ongoing for 6 weeks now and medication isn't helping.  Reason for Disposition  [1] MODERATE dizziness (e.g., interferes with normal activities) AND [2] has NOT been evaluated by doctor (or NP/PA) for this  (Exception: Dizziness caused by heat exposure, sudden standing, or poor fluid intake.)  Answer Assessment - Initial Assessment Questions 1. DESCRIPTION: Describe your dizziness.     Gets off balance, wall moving - four times episodes today  2. LIGHTHEADED: Do you feel lightheaded? (e.g., somewhat faint, woozy, weak upon standing)     yes 3. VERTIGO: Do you feel like either you or the room is spinning or tilting? (i.e., vertigo)     yes 4. SEVERITY: How bad is it?  Do you feel like you are going to faint? Can you stand and walk?     Uses walker at times- husband helps walk  5. ONSET:  When did the dizziness begin?     months 6. AGGRAVATING FACTORS: Does anything make it worse? (e.g., standing, change in head position)     Standing, sitting- occurs randomly   7. HEART RATE: Can  you tell me your heart rate? How many beats in 15 seconds?  (Note: Not all patients can do this.)       BP:         HR: 8. CAUSE: What do you think is causing the dizziness? (e.g., decreased fluids or food, diarrhea, emotional distress, heat exposure, new medicine, sudden standing, vomiting; unknown)     Decreased appetite  10. OTHER SYMPTOMS: Do you have any other symptoms? (e.g., fever, chest pain, vomiting, diarrhea, bleeding)       Urinary infection on 2 abx - dark urine and odor, SOB, poor sleep, fatigue  Protocols used: Dizziness - Lightheadedness-A-AH

## 2024-01-07 ENCOUNTER — Telehealth: Payer: Self-pay

## 2024-01-07 DIAGNOSIS — M818 Other osteoporosis without current pathological fracture: Secondary | ICD-10-CM

## 2024-01-07 MED ORDER — DENOSUMAB 60 MG/ML ~~LOC~~ SOSY
60.0000 mg | PREFILLED_SYRINGE | SUBCUTANEOUS | Status: AC
  Administered 2024-01-17: 60 mg via SUBCUTANEOUS

## 2024-01-07 NOTE — Telephone Encounter (Signed)
 Copied from CRM [id number]. Topic: Appointments - Scheduling Inquiry for Clinic >> Jan 07, 2024  3:51 PM Sharon Armstrong wrote: Reason for CRM: Pt is requesting an appt to have a prolia  and B12 shot done.

## 2024-01-07 NOTE — Telephone Encounter (Signed)
 Last Prolia  was 01/16/24.

## 2024-01-08 ENCOUNTER — Telehealth: Payer: Self-pay

## 2024-01-08 NOTE — Telephone Encounter (Signed)
 See other telephone note.

## 2024-01-08 NOTE — Telephone Encounter (Signed)
 Prolia  VOB initiated via [email or web address]  Next Prolia  inj DUE: 01/16/24

## 2024-01-09 ENCOUNTER — Other Ambulatory Visit (HOSPITAL_COMMUNITY): Payer: Self-pay

## 2024-01-09 NOTE — Telephone Encounter (Signed)
 Pt scheduled for both injections on 01/17/24

## 2024-01-09 NOTE — Telephone Encounter (Signed)
 Pt ready for scheduling for PROLIA  on or after : 01/16/24  Option# 1: Buy/Bill (Office supplied medication)  Out-of-pocket cost due at time of clinic visit: $0  Number of injection/visits approved: ---  Primary: MEDICARE Prolia  co-insurance: 0% Admin fee co-insurance: 0%  Secondary: UHC-MEDSUP Prolia  co-insurance:  Admin fee co-insurance:   Medical Benefit Details: Date Benefits were checked: 01/08/24 Deductible: $257 Met of $257 Required/ Coinsurance: 0%/ Admin Fee: 0%  Prior Auth: N/A PA# Expiration Date:   # of doses approved: ----------------------------------------------------------------------- Option# 2- Med Obtained from pharmacy:  Pharmacy benefit: Copay $881.45 (Paid to pharmacy) Admin Fee: 0% (Pay at clinic)  Prior Auth: N/A PA# Expiration Date:   # of doses approved:   If patient wants fill through the pharmacy benefit please send prescription to: [facility], and include estimated need by date in rx notes. Pharmacy will ship medication directly to the office.  Patient NOT eligible for Prolia  Copay Card. Copay Card can make patient's cost as little as $25. Link to apply: [email or web address]://[email or web address]  ** This summary of benefits is an estimation of the patient's out-of-pocket cost. Exact cost may very based on individual plan coverage.

## 2024-01-09 NOTE — Telephone Encounter (Signed)
 SABRA

## 2024-01-17 ENCOUNTER — Ambulatory Visit (INDEPENDENT_AMBULATORY_CARE_PROVIDER_SITE_OTHER): Admitting: *Deleted

## 2024-01-17 ENCOUNTER — Telehealth: Payer: Self-pay | Admitting: *Deleted

## 2024-01-17 DIAGNOSIS — E538 Deficiency of other specified B group vitamins: Secondary | ICD-10-CM | POA: Diagnosis not present

## 2024-01-17 DIAGNOSIS — M818 Other osteoporosis without current pathological fracture: Secondary | ICD-10-CM | POA: Diagnosis not present

## 2024-01-17 MED ORDER — DENOSUMAB 60 MG/ML ~~LOC~~ SOSY: 60.0000 mg | PREFILLED_SYRINGE | SUBCUTANEOUS | Status: AC

## 2024-01-17 MED ORDER — CYANOCOBALAMIN 1000 MCG/ML IJ SOLN
1000.0000 ug | Freq: Once | INTRAMUSCULAR | Status: AC
  Administered 2024-01-17: 1000 ug via INTRAMUSCULAR

## 2024-01-17 NOTE — Telephone Encounter (Signed)
 Pt here for prolia  and b12 injection. Just completed Cefpodoxime 100mg  twice daily for 10 days for UTI. Was prescribed on 01/02/24. Would like retest today to make sure it has cleared up.  Denies current symptoms, states urine has been looking clear but occasionally has dark urine.  Advised pt per PCP that we will not be able to do a recheck. She should call and schedule an appointment if symptoms return and pt voices understanding.

## 2024-01-17 NOTE — Progress Notes (Signed)
 Patient here for Prolia  injection per physicians orders  Prolia  60 mg SQ , was administered right arm today. Patient tolerated injection.  Patient next injection due: 6 months, appt made:  No- will schedule in 5 months after benefits are ran again  Initial injection: no  Did Prolia  come from pharmacy (if yes please select patient supplied): no  Cam placed for next injection: yes   Pt also requesting monthly B12 injection that she is past due for. Last given 10/18/23.  B12 1000mcg given IM, Left deltoid.  Next b12 scheduled for 02/17/24 @ 2:30pm.

## 2024-01-27 ENCOUNTER — Ambulatory Visit (INDEPENDENT_AMBULATORY_CARE_PROVIDER_SITE_OTHER): Admitting: Obstetrics and Gynecology

## 2024-01-27 ENCOUNTER — Encounter: Payer: Self-pay | Admitting: Obstetrics and Gynecology

## 2024-01-27 ENCOUNTER — Ambulatory Visit: Payer: Self-pay | Admitting: Obstetrics and Gynecology

## 2024-01-27 VITALS — BP 135/68 | HR 64 | Ht 63.5 in | Wt 156.0 lb

## 2024-01-27 DIAGNOSIS — Z87440 Personal history of urinary (tract) infections: Secondary | ICD-10-CM | POA: Diagnosis not present

## 2024-01-27 DIAGNOSIS — N952 Postmenopausal atrophic vaginitis: Secondary | ICD-10-CM | POA: Diagnosis not present

## 2024-01-27 DIAGNOSIS — N390 Urinary tract infection, site not specified: Secondary | ICD-10-CM

## 2024-01-27 LAB — POCT URINALYSIS DIP (CLINITEK)
Blood, UA: NEGATIVE
Glucose, UA: NEGATIVE mg/dL
Leukocytes, UA: NEGATIVE
Nitrite, UA: NEGATIVE
POC PROTEIN,UA: 100 — AB
Spec Grav, UA: 1.025 (ref 1.010–1.025)
Urobilinogen, UA: 1 U/dL
pH, UA: 6 (ref 5.0–8.0)

## 2024-01-27 MED ORDER — ESTRADIOL 0.1 MG/GM VA CREA: 0.5000 g | TOPICAL_CREAM | VAGINAL | 11 refills | Status: AC

## 2024-01-27 NOTE — Patient Instructions (Signed)
 Use estrogen cream nightly for 2 weeks. After the initial 2 weeks do this twice a week after.   We will send your urine for culture.   If there is still infection present we can consider doing the bladder instillations with the antibiotic inside.

## 2024-01-27 NOTE — Progress Notes (Signed)
 [facility] New Patient Evaluation and Consultation  Referring Provider: Amon Aloysius BRAVO, MD PCP: Amon Aloysius BRAVO, MD Date of Service: 01/27/2024  SUBJECTIVE Chief Complaint: New Patient (Initial Visit) (Sharon Armstrong is a 83 y.o. female is here for recurrent UTI.)  History of Present Illness: Sharon Armstrong is a 83 y.o. White or Caucasian female seen in consultation at the request of Dr. Amon for evaluation of rUTI.    Review of records significant for: Patient has had 1 UTI that was lingering and states she took x3 antibioitcs for.   Urinary Symptoms: Leaks urine with with movement to the bathroom Leaks infrequent Pad use: None Patient is bothered by UI symptoms.  Day time voids 5.  Nocturia: 3 times per night to void. Voiding dysfunction:  does not empty bladder well.  Patient does not use a catheter to empty bladder.  When urinating, patient feels the need to urinate multiple times in a row Drinks: 64oz Water and unsweet lemonade per day  UTIs: 2 UTI's in the last year.   Denies history of blood in urine, kidney or bladder stones, pyelonephritis, bladder cancer, and kidney cancer Susceptibility data from last 90 days. Collected Specimen Info Organism AMOXICILLIN /CLAVULANATE AMPICILLIN/SULBACTAM CEFAZOLIN  CEFEPIME Ceftazidime CEFTRIAXONE Ciprofloxacin  Gentamicin Susc lslt Imipenem LEVOFLOXACIN  Meropenem  11/19/23 Urine Escherichia coli  S  S  NR  S  S  S  I  S  S  I  S   Collected Specimen Info Organism Nitrofurantoin  Susc lslt Piperacillin + Tazobactam Trimethoprim /Sulfa   11/19/23 Urine Escherichia coli  S  S  S    Pelvic Organ Prolapse Symptoms:                  Patient Denies a feeling of a bulge the vaginal area.   Bowel Symptom: Bowel movements: 1 time(s) per day Stool consistency: hard Straining: yes.  Splinting: no.  Incomplete evacuation: yes.  Patient Denies accidental bowel leakage / fecal incontinence Bowel regimen: miralax Last colonoscopy:  Date 2022 [facility] Colonoscopy          Completed or No Longer Recommended     Colonoscopy  Discontinued      Frequency changed to Never automatically (Topic No Longer Applies)   01/26/2021  [facility] Colonoscopy component of [facility] COLONOSCOPY   Only the first 1 history entries have been loaded, but more history exists.                Sexual Function Sexually active: no.  Sexual orientation: Straight Pain with sex: No  Pelvic Pain Denies pelvic pain    Past Medical History:  Past Medical History:  Diagnosis Date   Adenomatous polyps 11/1998   Anemia    Anxiety    Aortic valve insufficiency    mild   Breast cancer (HCC) 04/2007   s/p XRT (last 4/09)   Cervical myelopathy (HCC)    Cervical stenosis of spine    sees neuro surgery and dr.ramons s/p local injection 4/09   Chronic constipation    Chronic pancreatitis (HCC) 09/29/2014   Colon polyps 02/2010   Tubular  Adenomas                                      Diabetes mellitus    Difficulty in urination    pt uses cath at times   Diverticulosis of colon    Elevated liver function tests  fatty liver per CT 2009   Fatty liver    Full body hives    if patient does not take Fexofenadine   Gastroparesis 10/2015   gastric emptying abnormal for 4 hours   GERD (gastroesophageal reflux disease)    Hemorrhoids    Hyperlipemia    Hypertension    Hypothyroidism    Insomnia    Lumbar spondylosis    Lumbar epidural steroid injection L5-S1 to the left, Dr. Bonner   Mitral valve regurgitation    Osteoarthritis    Personal history of radiation therapy 2009   Positive PPD    never treated father died TB   Restless leg syndrome    Urticaria 06/2009     Past Surgical History:   Past Surgical History:  Procedure Laterality Date   ANTERIOR CERVICAL DECOMP/DISCECTOMY FUSION N/A 01/08/2013   Procedure: Cervical three-four, four-five, five-six anterior cervical decompression with fusion plating and bonegraft;  Surgeon: Alm GORMAN Molt,  MD;  Location: [facility];  Service: Neurosurgery;  Laterality: N/A;  Cervical three-four, four-five, five-six anterior cervical decompression with fusion plating and bonegraft   ANTERIOR CERVICAL DECOMP/DISCECTOMY FUSION N/A 03/25/2020   Procedure: CERVICAL TWO-THREE ANTERIOR CERVICAL DECOMPRESSION/DISCECTOMY FUSION WITH REMOVAL OF PLATE;  Surgeon: Molt Alm GORMAN, MD;  Location: [facility];  Service: Neurosurgery;  Laterality: N/A;  3C   APPENDECTOMY     BREAST LUMPECTOMY Left 2009   left breast   CARDIAC CATHETERIZATION  2001   CATARACT EXTRACTION, BILATERAL  2/11   EYE SURGERY     GANGLION CYST EXCISION Right    wrist   sinus cyst removed     TRANSCATHETER AORTIC VALVE REPLACEMENT, TRANSAORTIC  01/09/2019   UTERINE FIBROID SURGERY       Past OB/GYN History: [id number] Vaginal deliveries: 2,  Forceps/ Vacuum deliveries: 0, Cesarean section: 0 Menopausal: Yes Contraception: N/a. Last pap smear was unknown [facility] PAP   This patient has no relevant Health Maintenance data.     Medications: Patient has a current medication list which includes the following prescription(s): accu-chek guide, amlodipine , amoxicillin -clavulanate, aspirin  ec, cyanocobalamin , denosumab , estradiol , ferrous sulfate, fluticasone , furosemide, hydrochlorothiazide , novolog  flexpen, insulin  pen needle, levothyroxine , lidocaine , lisinopril , metformin , milk thistle, ondansetron , oxycodone  hcl, pramipexole , pregabalin , rosuvastatin , and solifenacin, and the following Facility-Administered Medications: [START ON 07/15/2024] denosumab .   Allergies: Patient is allergic to bactrim  [sulfamethoxazole -trimethoprim ], ciprofloxacin , gabapentin , iodine, iohexol, nsaids, spironolactone, tolmetin, aciphex [rabeprazole], ioxaglate, ivp dye [iodinated contrast media], nexium [esomeprazole magnesium], pepcid [famotidine], and protonix [pantoprazole sodium].   Social History:  Social History   Tobacco Use   Smoking status: Never   Smokeless  tobacco: Never  Vaping Use   Vaping status: Never Used  Substance Use Topics   Alcohol  use: Yes    Alcohol /week: 0.0 standard drinks of alcohol     Comment: rare   Drug use: No    Relationship status: married Patient lives with Husband.   Patient is not employed . Regular exercise: No History of abuse: No  Family History:   Family History  Problem Relation Age of Onset   Hypertension Mother    Lung cancer Mother        mets to brain   Brain cancer Mother    Tuberculosis Father        died at 101   Coronary artery disease Neg Hx    Diabetes Neg Hx    Stroke Neg Hx    Colon cancer Neg Hx    Breast cancer Neg Hx  Review of Systems: Review of Systems  Constitutional:  Positive for malaise/fatigue. Negative for chills and fever.  Respiratory:  Positive for shortness of breath. Negative for cough.   Cardiovascular:  Positive for leg swelling. Negative for chest pain and palpitations.  Gastrointestinal:  Negative for abdominal pain, blood in stool, constipation and diarrhea.  Skin:  Negative for rash.  Neurological:  Positive for weakness.  Endo/Heme/Allergies:        +Hot Flashes  Psychiatric/Behavioral:  Positive for depression. Negative for suicidal ideas. The patient is nervous/anxious.      OBJECTIVE Physical Exam: Vitals:   01/27/24 1417  BP: 135/68  Pulse: 64  Weight: 156 lb (70.8 kg)  Height: 5' 3.5 (1.613 m)    Physical Exam Vitals reviewed. Exam conducted with a chaperone present.  Constitutional:      Appearance: Normal appearance.  Pulmonary:     Effort: Pulmonary effort is normal.  Abdominal:     Palpations: Abdomen is soft.  Neurological:     General: No focal deficit present.     Mental Status: She is alert and oriented to person, place, and time.  Psychiatric:        Mood and Affect: Mood normal.        Behavior: Behavior normal. Behavior is cooperative.        Thought Content: Thought content normal.      GU / Detailed  Urogynecologic Evaluation:  Pelvic Exam: Normal external female genitalia; Bartholin's and Skene's glands normal in appearance; urethral meatus normal in appearance, no urethral masses or discharge.   CST: negative  Speculum exam reveals normal vaginal mucosa with atrophy. Cervix normal appearance. Uterus normal single, nontender. Adnexa normal adnexa.    With apex supported, anterior compartment defect was reduced  Pelvic floor strength III/V  Pelvic floor musculature: Right levator non-tender, Right obturator non-tender, Left levator non-tender, Left obturator non-tender  POP-Q:   POP-Q  -3                                            Aa   -3                                           Ba  -8                                              C   2.5                                            Gh  3.5                                            Pb  10  tvl   -3                                            Ap  -3                                            Bp  -8.5                                              D      Rectal Exam:  Normal external exam.  Post-Void Residual (PVR) by Bladder Scan: In order to evaluate bladder emptying, we discussed obtaining a postvoid residual and patient agreed to this procedure.  Procedure: The ultrasound unit was placed on the patient's abdomen in the suprapubic region after the patient had voided.      Laboratory Results: Lab Results  Component Value Date   COLORU yellow 01/27/2024   CLARITYU clear 01/27/2024   GLUCOSEUR negative 01/27/2024   BILIRUBINUR small (A) 01/27/2024   KETONESU Negative 11/19/2023   SPECGRAV 1.025 01/27/2024   RBCUR negative 01/27/2024   PHUR 6.0 01/27/2024   PROTEINUR Positive (A) 11/19/2023   UROBILINOGEN 1.0 01/27/2024   LEUKOCYTESUR Negative 01/27/2024    Lab Results  Component Value Date   CREATININE 0.9 07/04/2023   CREATININE 1.0 05/14/2023    CREATININE 0.8 04/01/2023    Lab Results  Component Value Date   HGBA1C 7.0 07/04/2023    Lab Results  Component Value Date   HGB 11.8 (A) 05/14/2023     ASSESSMENT AND PLAN Ms. Torgeson is a 83 y.o. with:  1. Vaginal atrophy   2. Recurrent UTI    Patient has vaginal atrophy on exam. She would benefit from estrogen cream. Patient to use a blueberry sized amount into the vagina. She may use this nightly for 2 weeks and then twice weekly after. We discussed using her finger instead of using the applicator.  Discussed focusing on estrogen cream support at this time and if there is continued issues they are to call and we can discuss preventative medication such as Hiprex. We could also potentially do bladder instillations with Gentamicin if warranted. Patient's urine today not concerning for infection, no need to send for culture. Main symptoms of burning may improve with vaginal estrogen support.    Patient to follow up PRN.    Joselinne Lawal G Antwine Agosto, NP

## 2024-01-28 ENCOUNTER — Telehealth: Payer: Self-pay

## 2024-01-28 NOTE — Telephone Encounter (Signed)
 Incoming call from patients husband to let you know about a medication you discussed yesterday. The medication is Clobetasol Ointment 0.05%.

## 2024-02-06 ENCOUNTER — Other Ambulatory Visit (HOSPITAL_COMMUNITY)
Admission: RE | Admit: 2024-02-06 | Discharge: 2024-02-06 | Disposition: A | Discharge status: Home / Self Care | Source: Other Acute Inpatient Hospital | Attending: Obstetrics | Admitting: Obstetrics

## 2024-02-06 ENCOUNTER — Ambulatory Visit: Payer: Self-pay | Admitting: Obstetrics

## 2024-02-06 ENCOUNTER — Ambulatory Visit (INDEPENDENT_AMBULATORY_CARE_PROVIDER_SITE_OTHER)

## 2024-02-06 VITALS — BP 110/58 | HR 67 | Temp 98.1°F

## 2024-02-06 DIAGNOSIS — R82998 Other abnormal findings in urine: Secondary | ICD-10-CM | POA: Insufficient documentation

## 2024-02-06 DIAGNOSIS — R300 Dysuria: Secondary | ICD-10-CM | POA: Diagnosis not present

## 2024-02-06 DIAGNOSIS — N390 Urinary tract infection, site not specified: Secondary | ICD-10-CM

## 2024-02-06 LAB — POCT URINALYSIS DIP (CLINITEK)
Bilirubin, UA: NEGATIVE
Blood, UA: NEGATIVE
Glucose, UA: NEGATIVE mg/dL
Nitrite, UA: POSITIVE — AB
POC PROTEIN,UA: 30 — AB
Spec Grav, UA: 1.02 (ref 1.010–1.025)
Urobilinogen, UA: 0.2 U/dL
pH, UA: 5.5 (ref 5.0–8.0)

## 2024-02-06 MED ORDER — NITROFURANTOIN MONOHYD MACRO 100 MG PO CAPS: 100.0000 mg | ORAL_CAPSULE | Freq: Two times a day (BID) | ORAL | 0 refills | Status: AC

## 2024-02-06 NOTE — Progress Notes (Signed)
 Sharon Armstrong arrived today with cloudy urine and odor, intermittent lower abdominal pain. Patient is notexperiencing fever, unstable vitals and/or one-sided back flank pain. Patient has not had had a recent hospitalization due to UTI.  Last visit in the office was 01/28/2024.  Per protocol:   The most recent Urinalysis completed on 01-27-2024 and wasnormal.  Last Creatinine level  Lab Results  Component Value Date   CREATININE 0.9 07/04/2023    An urine specimen was collected and POCT urinalysis completed. [x] A cath specimen was collected due to patient's current condition, symptoms or post-procedural state.  Total urine output by catheter is  Output by Drain (mL) 02/04/24 0701 - 02/04/24 1900 02/04/24 1901 - 02/05/24 0700 02/05/24 0701 - 02/05/24 1900 02/05/24 1901 - 02/06/24 0700 02/06/24 0701 - 02/06/24 1111  Patient has no LDAs of requested type attached.     POCT Urine results is not normal.  Urine micro was not sent per protocol for abnormal urinalysis.  Urine culture was sent per protocol for abnormal urinalysis.     [] Pt was notified of positive urine results and plan for additional urine testing. We will contact you within the next 3-4 days with these results.  [] No Prescription was sent to your pharmacy.  The additional testing will indicate if a prescription is needed.   [x] Patient was notified of abnormal urine results. The following prescription is sent to your preferred pharmacy.  []  Macrobid  100mg  #10 1 tablet by mouth twice daily with food for 5 days      []  Bactrim  DS 800-160mg  #6 1 tablet by mouth twice daily for 3 days        []  Due to your current medication allergies, an alternate prescription was discussed with your provider and will be prescribed and sent to your pharmacy.  [] You can take over the counter AZO two tablets up to three times a day for two days.  Take AZO tablets with a full glass of water. AZO will turn your urine orange, this is normal.   [] The patient was  notified of negative urine results.  If symptoms persist, you may take over the counter AZO two tablets up to three times a day for two days.  AZO will turn your urine orange, this is normal.  Contact the office back to schedule an appointment if your symptoms persist or worsen or you develop additional symptoms.       CC'd note to patient's provider.

## 2024-02-06 NOTE — Patient Instructions (Addendum)
 We have sent your urine sample for further testing.   We have sent Macrobid  to the pharmacy. You will take this twice a day for 5 days. Once the culture results are back we will let you know if we need to change the antibiotic.   You can take over the counter AZO two tablets up to three times a day for two days. Take AZO tablets with a full glass of water. AZO will turn your urine orange, this is normal.   Please call if you continue to experience persistent or worsening urinary symptoms such as fever > 100.4, nausea/vomiting, one sided back pain or blood in your urine.    It was a pleasure to see you today!  Thank you for trusting me with your care!

## 2024-02-08 LAB — URINE CULTURE: Culture: >=100000 — AB

## 2024-02-17 ENCOUNTER — Ambulatory Visit: Admitting: *Deleted

## 2024-02-17 DIAGNOSIS — E538 Deficiency of other specified B group vitamins: Secondary | ICD-10-CM

## 2024-02-17 MED ORDER — CYANOCOBALAMIN 1000 MCG/ML IJ SOLN
1000.0000 ug | Freq: Once | INTRAMUSCULAR | Status: AC
  Administered 2024-02-17: 1000 ug via INTRAMUSCULAR

## 2024-02-17 NOTE — Progress Notes (Signed)
 Patient here for monthly b12 injection per physicians order.  Injection given in right deltoid and patient tolerated well.

## 2024-03-04 ENCOUNTER — Ambulatory Visit: Admitting: Internal Medicine

## 2024-03-06 ENCOUNTER — Encounter: Payer: Self-pay | Admitting: Internal Medicine

## 2024-03-06 ENCOUNTER — Ambulatory Visit: Admitting: Internal Medicine

## 2024-03-06 VITALS — BP 120/80 | HR 74 | Temp 98.2°F | Resp 16 | Ht 63.5 in | Wt 156.1 lb

## 2024-03-06 DIAGNOSIS — K861 Other chronic pancreatitis: Secondary | ICD-10-CM

## 2024-03-06 DIAGNOSIS — Z23 Encounter for immunization: Secondary | ICD-10-CM

## 2024-03-06 DIAGNOSIS — E038 Other specified hypothyroidism: Secondary | ICD-10-CM

## 2024-03-06 DIAGNOSIS — K7460 Unspecified cirrhosis of liver: Secondary | ICD-10-CM

## 2024-03-06 DIAGNOSIS — I10 Essential (primary) hypertension: Secondary | ICD-10-CM

## 2024-03-06 DIAGNOSIS — E119 Type 2 diabetes mellitus without complications: Secondary | ICD-10-CM | POA: Diagnosis not present

## 2024-03-06 NOTE — Progress Notes (Signed)
 "  Subjective:    Patient ID: Sharon Armstrong, female    DOB: [birth year], 83 y.o.   MRN: [id number]  DOS:  03/06/2024 Follow-up  Discussed the use of AI scribe software for clinical note transcription with the patient, who gave verbal consent to proceed.  History of Present Illness  Visual disturbance - Progressive vision loss due to diabetic retinopathy  Peripheral neuropathy and gait impairment - Worsening neuropathy in the legs - Increasing difficulty with ambulation - Uses cane, walker, and scooter prn  for mobility  Excessive daytime sleepiness - Increased sleepiness with sudden episodes of falling asleep  Dizziness and orthostatic symptoms - Dizziness began four months ago - Occurs when standing and walking - Lasts for seconds - Not associated with positional changes in bed  Blood pressure variability - Blood pressure readings are variable - Recent readings as high as 161/68 - Fluctuates between high and low      BP Readings from Last 3 Encounters:  03/06/24 120/80  02/06/24 (!) 110/58  01/27/24 135/68    Review of Systems See above   Past Medical History:  Diagnosis Date   Adenomatous polyps 11/1998   Anemia    Anxiety    Aortic valve insufficiency    mild   Breast cancer (HCC) 04/2007   s/p XRT (last 4/09)   Cervical myelopathy (HCC)    Cervical stenosis of spine    sees neuro surgery and dr.ramons s/p local injection 4/09   Chronic constipation    Chronic pancreatitis (HCC) 09/29/2014   Colon polyps 02/2010   Tubular  Adenomas                                      Diabetes mellitus    Difficulty in urination    pt uses cath at times   Diverticulosis of colon    Elevated liver function tests    fatty liver per CT 2009   Fatty liver    Full body hives    if patient does not take Fexofenadine   Gastroparesis 10/2015   gastric emptying abnormal for 4 hours   GERD (gastroesophageal reflux disease)    Hemorrhoids    Hyperlipemia     Hypertension    Hypothyroidism    Insomnia    Lumbar spondylosis    Lumbar epidural steroid injection L5-S1 to the left, Dr. Bonner   Mitral valve regurgitation    Osteoarthritis    Personal history of radiation therapy 2009   Positive PPD    never treated father died TB   Restless leg syndrome    Urticaria 06/2009    Past Surgical History:  Procedure Laterality Date   ANTERIOR CERVICAL DECOMP/DISCECTOMY FUSION N/A 01/08/2013   Procedure: Cervical three-four, four-five, five-six anterior cervical decompression with fusion plating and bonegraft;  Surgeon: Alm GORMAN Molt, MD;  Location: [facility];  Service: Neurosurgery;  Laterality: N/A;  Cervical three-four, four-five, five-six anterior cervical decompression with fusion plating and bonegraft   ANTERIOR CERVICAL DECOMP/DISCECTOMY FUSION N/A 03/25/2020   Procedure: CERVICAL TWO-THREE ANTERIOR CERVICAL DECOMPRESSION/DISCECTOMY FUSION WITH REMOVAL OF PLATE;  Surgeon: Molt Alm GORMAN, MD;  Location: [facility];  Service: Neurosurgery;  Laterality: N/A;  3C   APPENDECTOMY     BREAST LUMPECTOMY Left 2009   left breast   CARDIAC CATHETERIZATION  2001   CATARACT EXTRACTION, BILATERAL  2/11   EYE SURGERY  GANGLION CYST EXCISION Right    wrist   sinus cyst removed     TRANSCATHETER AORTIC VALVE REPLACEMENT, TRANSAORTIC  01/09/2019   UTERINE FIBROID SURGERY      Current Outpatient Medications  Medication Instructions   ACCU-CHEK GUIDE test strip USE TO TEST BLOOD SUGAR TWICE DAILY- DX CODE E11.9   amLODipine  (NORVASC ) 5 mg, Daily   aspirin EC 81 mg, Daily   Cyanocobalamin  1,000 mcg, See admin instructions   denosumab  (PROLIA ) 60 mg, Every 6 months   estradiol  (ESTRACE ) 0.5 g, Vaginal, 2 times weekly, Place 0.5g nightly for two weeks then twice a week after   ferrous sulfate 325 mg, Every morning   fluticasone  (FLONASE ) 50 MCG/ACT nasal spray 2 sprays, Each Nare, Daily   furosemide (LASIX) 20 MG tablet 1 tablet, Daily   hydrochlorothiazide   (MICROZIDE ) 12.5 mg, Every morning   insulin  aspart (NOVOLOG  FLEXPEN) 100 UNIT/ML FlexPen INJECT 5-7 UNITS BEFORE DINNER AND LARGER MEALS   Insulin  Pen Needle 31G X 6 MM MISC Use to inject insulin  up to 4 times daily.   levothyroxine  (SYNTHROID ) 112 mcg, Oral, Daily before breakfast   Lidocaine  4 % PTCH Apply topically.   lisinopril  (ZESTRIL ) 40 mg, Daily   metFORMIN  (GLUCOPHAGE ) 1,000 mg, 2 times daily with meals   MILK THISTLE PLUS PO 450 mg, 2 times daily   Multiple Vitamins-Minerals (ICAPS AREDS 2 PO) Take by mouth.   ondansetron  (ZOFRAN -ODT) 4 mg, Oral, Every 8 hours PRN   Oxycodone  HCl 10 MG TABS 1 tablet, 4 times daily PRN   Polyethyl Glycol-Propyl Glycol (SYSTANE OP) Apply to eye.   pramipexole  (MIRAPEX ) 0.125 mg, Oral, Daily at bedtime, Take 1-1/2 to 2 hours before projected bedtime.   rosuvastatin  (CRESTOR ) 5 mg, Oral, Daily   solifenacin (VESICARE) 5 mg, Daily       Objective:   Physical Exam BP 120/80   Pulse 74   Temp 98.2 F (36.8 C) (Oral)   Resp 16   Ht 5' 3.5 (1.613 m)   Wt 156 lb 2 oz (70.8 kg)   SpO2 96%   BMI 27.22 kg/m  General:   Well developed, NAD, BMI noted. HEENT:  Normocephalic . Face symmetric, atraumatic Lungs:  CTA B Normal respiratory effort, no intercostal retractions, no accessory muscle use. Heart: RRR,  no murmur.  Lower extremities: no pretibial edema bilaterally  Skin: Not pale. Not jaundice Neurologic:  alert & oriented X3.  Speech normal, gait at baseline, slt unsteady Psych--  Cognition and judgment appear intact.  Cooperative with normal attention span and concentration.  Behavior appropriate. No anxious or depressed appearing.      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 Started Prolia  approximately 05-2011 GI: used to see Dr Aneita, last OV 10-2014 , now Dr Lemont  --Nausea, vomiting, eval in [place] ~ 12- 2016: Delayed gastric emptying, EGD biopsy no H. Pylori,SIBO +, Rx ABX --GERD  --Chronic pancreatitis, h/o episodes of acute pancreatitis -- Liver cirrhosis per MRI 12-2019 --Chronic constipation Breast cancer dx 2008  XRT H/o +PPD H/o Urticaria H/ o Mild aortic valve insufficiency     Assessment & Plan DM: With diabetic polyneuropathy and diabetic retinopathy - Continue follow-up with endocrinologist. - Advise daily foot checks and avoid walking barefoot. HTN: Blood pressure varies significantly, with recent readings of 161/68.   - Continue amlodipine , Lasix, hydrochlorothiazide , and lisinopril . Monitor BPs at home, see AVS - Check BMP and CBC. Cardiology office visit 12/06/2023: --Edema, question of heart failure, on Lasix as needed.  Was rec to continue amlodipine , lisinopril , HCTZ and carvedilol. --S/p TAVR: Continue aspirin, ABX prior to dental procedures Hyperlipidemia Cholesterol well controlled on Crestor . Hypothyroidism Currently managed with levothyroxine . Check TSH. Osteoporosis Last Prolia  shot in August, next due in February. Will be in [place]  at that time. Discuss Prolia  shot with [place]  doctors. Liver Cirrhosis Received two hepatitis B shots.  Literature reviewed-- series completed Urinary urgency and incontinence Continues to experience urgency and incontinence. Rx solifenacin by urology, unclear if she will take it  Dizziness Intermittent dizziness for four months, as described above, lasting seconds. Not associated with stroke symptoms or syncope.  Recommend observation. Chronic pancreatitis: Per GI,  currently silent Gait d/o and falls Experiences difficulty walking and has a history of falls. Uses a cane, walker, and scooter. Recent fall due to slipping, no injury reported. Offered PT, she will consider but is leaving soon, see next Social: She is leaving to [place]  soon where she has been the wind, recommend to see her PCP there ASAP due to the number of medical issues that she has. Preventive care: flu shot today RTC when she is back from [facility]    "

## 2024-03-06 NOTE — Patient Instructions (Addendum)
 GO TO THE LAB :  Get the blood work    Then, go to the front desk for the checkout Please make an appointment for a checkup in 6 months   You are a flu shot today Please consider COVID-vaccine Also RSV and pneumonia shot (PNM 20)    Continue checking your blood pressure regularly Blood pressure goal:  between 110/65 and  135/85. 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 -Continue follow-up with your endocrinologist. -Perform daily foot checks and avoid walking barefoot.  HYPERTENSION: Your blood pressure is varying significantly and needs to be regulated  -Continue taking amlodipine , Lasix, hydrochlorothiazide , and lisinopril .    HYPERLIPIDEMIA: Your cholesterol levels are well controlled with Crestor . -Continue taking Crestor  as prescribed.  HYPOTHYROIDISM: Your thyroid  condition is currently managed with levothyroxine . -We will check your TSH levels.  OSTEOPOROSIS: You received your last Prolia  shot in February and the next one is due in February. -Discuss the Prolia  shot with your doctors in [place] .  CIRRHOSIS: You have liver cirrhosis and have received two hepatitis B shots. You do not need more shots.  URINARY URGENCY AND INCONTINENCE: You continue to experience urgency and incontinence. -Monitor your symptoms and inform us  of any changes.  DIZZINESS: You have been experiencing intermittent dizziness for the past four months. -Monitor your symptoms and inform us  if they worsen or change.  DIFFICULTY WALKING AND FALLS: You have difficulty walking and have a history of falls. You use a cane, walker, and scooter. -Consider physical therapy if needed.

## 2024-03-08 NOTE — Assessment & Plan Note (Addendum)
 DM: With diabetic polyneuropathy and diabetic retinopathy - Continue follow-up with endocrinologist. - Advise daily foot checks and avoid walking barefoot. HTN: Blood pressure varies significantly, with recent readings of 161/68.   - Continue amlodipine , Lasix, hydrochlorothiazide , and lisinopril . Monitor BPs at home, see AVS - Check BMP and CBC. Cardiology office visit 12/06/2023: --Edema, question of heart failure, on Lasix as needed.  Was rec to continue amlodipine , lisinopril , HCTZ and carvedilol. --S/p TAVR: Continue aspirin , ABX prior to dental procedures Hyperlipidemia Cholesterol well controlled on Crestor . Hypothyroidism Currently managed with levothyroxine . Check TSH. Osteoporosis Last Prolia  shot in August, next due in February. Will be in [place]  at that time. Discuss Prolia  shot with [place]  doctors. Liver Cirrhosis Received two hepatitis B shots.  Literature reviewed-- series completed Urinary urgency and incontinence Continues to experience urgency and incontinence. Rx solifenacin by urology, unclear if she will take it  Dizziness Intermittent dizziness for four months, as described above, lasting seconds. Not associated with stroke symptoms or syncope.  Recommend observation. Chronic pancreatitis: Per GI, currently silent Gait d/o and falls Experiences difficulty walking and has a history of falls. Uses a cane, walker, and scooter. Recent fall due to slipping, no injury reported. Offered PT, she will consider but is leaving soon, see next Social: She is leaving to [place]  soon where she has been the wind, recommend to see her PCP there ASAP due to the number of medical issues that she has. Preventive care: flu shot today RTC when she is back from [facility]

## 2024-03-09 ENCOUNTER — Other Ambulatory Visit

## 2024-03-09 DIAGNOSIS — E038 Other specified hypothyroidism: Secondary | ICD-10-CM

## 2024-03-09 DIAGNOSIS — I10 Essential (primary) hypertension: Secondary | ICD-10-CM

## 2024-03-09 LAB — CBC WITH DIFFERENTIAL/PLATELET
Basophils Absolute: 0 K/uL (ref 0.0–0.1)
Basophils Relative: 0.1 % (ref 0.0–3.0)
Eosinophils Absolute: 0 K/uL (ref 0.0–0.7)
Eosinophils Relative: 0 % (ref 0.0–5.0)
HCT: 37.2 % (ref 36.0–46.0)
Hemoglobin: 12.2 g/dL (ref 12.0–15.0)
Lymphocytes Relative: 30.9 % (ref 12.0–46.0)
Lymphs Abs: 1.5 K/uL (ref 0.7–4.0)
MCHC: 32.9 g/dL (ref 30.0–36.0)
MCV: 94.9 fl (ref 78.0–100.0)
Monocytes Absolute: 0.4 K/uL (ref 0.1–1.0)
Monocytes Relative: 7.5 % (ref 3.0–12.0)
Neutro Abs: 3 K/uL (ref 1.4–7.7)
Neutrophils Relative %: 61.5 % (ref 43.0–77.0)
Platelets: 127 K/uL — ABNORMAL LOW (ref 150.0–400.0)
RBC: 3.92 Mil/uL (ref 3.87–5.11)
RDW: 15.1 % (ref 11.5–15.5)
WBC: 4.8 K/uL (ref 4.0–10.5)

## 2024-03-09 LAB — BASIC METABOLIC PANEL WITH GFR
BUN: 21 mg/dL (ref 6–23)
CO2: 29 meq/L (ref 19–32)
Calcium: 9.2 mg/dL (ref 8.4–10.5)
Chloride: 101 meq/L (ref 96–112)
Creatinine, Ser: 0.76 mg/dL (ref 0.40–1.20)
GFR: 72.74 mL/min (ref >60.00)
Glucose, Bld: 116 mg/dL — ABNORMAL HIGH (ref 70–99)
Potassium: 4 meq/L (ref 3.5–5.1)
Sodium: 142 meq/L (ref 135–145)

## 2024-03-09 LAB — TSH: TSH: 3.83 u[IU]/mL (ref 0.35–5.50)

## 2024-03-10 ENCOUNTER — Ambulatory Visit: Payer: Self-pay | Admitting: Internal Medicine

## 2024-04-20 ENCOUNTER — Other Ambulatory Visit: Payer: Self-pay | Admitting: Internal Medicine

## 2024-06-01 ENCOUNTER — Encounter: Payer: Self-pay | Admitting: *Deleted

## 2024-06-22 ENCOUNTER — Telehealth: Payer: Self-pay

## 2024-06-22 NOTE — Telephone Encounter (Signed)
 Prolia  VOB initiated via [email or web address]  Next Prolia  inj DUE: 07/19/24

## 2024-06-24 ENCOUNTER — Other Ambulatory Visit (HOSPITAL_COMMUNITY): Payer: Self-pay

## 2024-06-24 NOTE — Telephone Encounter (Signed)
 Sharon Armstrong

## 2024-06-24 NOTE — Telephone Encounter (Signed)
 Pt ready for scheduling for PROLIA  on or after : 07/19/24  Option# 1: Buy/Bill (Office supplied medication)  Out-of-pocket cost due at time of clinic visit: $0  Number of injection/visits approved: ---  Primary: MEDICARE Prolia  co-insurance: 0% Admin fee co-insurance: 0%  Secondary: UHC-MEDSUP Prolia  co-insurance:  Admin fee co-insurance:   Medical Benefit Details: Date Benefits were checked: 06/24/24 Deductible: NO/ Coinsurance: 0%/ Admin Fee: 0%  Prior Auth: N/A PA# Expiration Date:   # of doses approved: ----------------------------------------------------------------------- Option# 2- Med Obtained from pharmacy:  Pharmacy benefit: Copay $907.90 (Paid to pharmacy) Admin Fee: 0% (Pay at clinic)  Prior Auth: N/A PA# Expiration Date:   # of doses approved:   If patient wants fill through the pharmacy benefit please send prescription to: [facility], and include estimated need by date in rx notes. Pharmacy will ship medication directly to the office.  Patient NOT eligible for Prolia  Copay Card. Copay Card can make patient's cost as little as $25. Link to apply: [email or web address]  ** This summary of benefits is an estimation of the patient's out-of-pocket cost. Exact cost may very based on individual plan coverage.

## 2024-09-02 ENCOUNTER — Ambulatory Visit: Admitting: Internal Medicine
